# Patient Record
Sex: Female | Born: 1945 | Race: White | Hispanic: No | Marital: Married | Smoking: Never smoker
Health system: Southern US, Community
[De-identification: ages and names within clinical notes are randomized; demographics above are authoritative.]

## PROBLEM LIST (undated history)

## (undated) DIAGNOSIS — E785 Hyperlipidemia, unspecified: Secondary | ICD-10-CM

## (undated) HISTORY — DX: Hyperlipidemia, unspecified: E78.5

---

## 1983-07-25 HISTORY — PX: THYROIDECTOMY: SHX17

## 2001-01-29 ENCOUNTER — Other Ambulatory Visit: Admission: RE | Admit: 2001-01-29 | Discharge: 2001-01-29 | Payer: Self-pay | Admitting: Family Medicine

## 2004-05-05 ENCOUNTER — Other Ambulatory Visit: Admission: RE | Admit: 2004-05-05 | Discharge: 2004-05-05 | Payer: Self-pay | Admitting: Family Medicine

## 2005-05-05 ENCOUNTER — Encounter (INDEPENDENT_AMBULATORY_CARE_PROVIDER_SITE_OTHER): Payer: Self-pay | Admitting: Specialist

## 2005-05-05 ENCOUNTER — Observation Stay (HOSPITAL_COMMUNITY): Admission: RE | Admit: 2005-05-05 | Discharge: 2005-05-06 | Payer: Self-pay | Admitting: Obstetrics and Gynecology

## 2007-02-21 ENCOUNTER — Other Ambulatory Visit: Admission: RE | Admit: 2007-02-21 | Discharge: 2007-02-21 | Payer: Self-pay | Admitting: Family Medicine

## 2009-10-01 ENCOUNTER — Encounter (INDEPENDENT_AMBULATORY_CARE_PROVIDER_SITE_OTHER): Payer: Self-pay | Admitting: *Deleted

## 2010-09-20 ENCOUNTER — Other Ambulatory Visit: Payer: Self-pay | Admitting: Family Medicine

## 2010-09-20 DIAGNOSIS — Z1231 Encounter for screening mammogram for malignant neoplasm of breast: Secondary | ICD-10-CM

## 2010-09-27 ENCOUNTER — Ambulatory Visit
Admission: RE | Admit: 2010-09-27 | Discharge: 2010-09-27 | Disposition: A | Discharge status: Home / Self Care | Payer: BC Managed Care – PPO | Source: Ambulatory Visit | Attending: Family Medicine | Admitting: Family Medicine

## 2010-09-27 DIAGNOSIS — Z1231 Encounter for screening mammogram for malignant neoplasm of breast: Secondary | ICD-10-CM

## 2010-09-28 ENCOUNTER — Ambulatory Visit: Payer: Self-pay

## 2010-12-09 NOTE — Discharge Summary (Signed)
Samantha Graham, Samantha Graham           ACCOUNT NO.:  [id number]   MEDICAL RECORD NO.:  [id number]          PATIENT TYPE:  OBV   LOCATION:  9302                          FACILITY:  [facility]   PHYSICIAN:  Duke Salvia. Marcelle Overlie, M.D.DATE OF BIRTH:  1945-10-02   DATE OF ADMISSION:  05/05/2005  DATE OF DISCHARGE:  05/06/2005                                 DISCHARGE SUMMARY   DISCHARGE DIAGNOSES:  1.  Pelvic pain, dyspareunia.  2.  Laparoscopically assisted vaginal hysterectomy, bilateral salpingo-      oophorectomy this admission.   For summary of history and physical exam, please see admission H&P for  details. A 65 year old has had a prior myomectomy presents with increased  pelvic pressure, pain and dyspareunia for LAVH/BSO.   HOSPITAL COURSE:  On October 13 under general anesthesia, the patient  underwent LAVH/BSO, dense adhesions from the prior myomectomy were noted  along the anterior abdominal wall which were lysed. LAVH/BSO were carried  out. On the first postoperative day, she was doing well, afebrile. Abdominal  exam was unremarkable, hemoglobin 11.6, and she was ready for discharge.   LABORATORY DATA:  Preoperative hemoglobin 13.9. UA was negative.   DISPOSITION:  The patient was discharged on Tylox p.r.n. pain and will  return to the office in one week. Advised her to report any incisional  redness or drainage, increased pain or bleeding or fever over 101. She was  given specific instructions regarding diet and exercise.   CONDITION:  Good.   ACTIVITY:  Gradually increase.      Richard M. Marcelle Overlie, M.D.  Electronically Signed     RMH/MEDQ  D:  05/06/2005  T:  05/06/2005  Job:  [id number]

## 2010-12-09 NOTE — Op Note (Signed)
NAMEANYE, BROSE           ACCOUNT NO.:  [id number]   MEDICAL RECORD NO.:  [id number]          PATIENT TYPE:  AMB   LOCATION:  SDC                           [facility]ILITY:  [facility]   PHYSICIAN:  Duke Salvia. Marcelle Overlie, M.D.DATE OF BIRTH:  [birth year]   DATE OF PROCEDURE:  05/05/2005  DATE OF DISCHARGE:                                 OPERATIVE REPORT   PREOPERATIVE DIAGNOSIS:  Pelvic pain, dyspareunia.   POSTOPERATIVE DIAGNOSIS:  Pelvic adhesions from her prior myomectomy.   PROCEDURE:  Diagnostic laparoscopy, lysis of adhesions, LAVH, BSO.   SURGEON:  Duke Salvia. Marcelle Overlie, M.D.   ASSISTANT:  Zelphia Cairo, M.D.   ANESTHESIA:  General endotracheal.   COMPLICATIONS:  None.   ESTIMATED BLOOD LOSS:  100.   SPECIMENS REMOVED:  Uterus, bilateral tubes and ovaries.   DESCRIPTION OF PROCEDURE:  The patient was taken to the operating room and  after adequate level of general endotracheal anesthesia was obtained with  the patient's legs in stirrups. The abdomen and perineum and vagina prepped  and draped in usual manner for laparoscopy. Hulka tenaculum position after  Foley catheter used to drain the bladder. The subumbilical area was  infiltrated with half percent Marcaine plain. A small incision was made and  the Veress needle was introduced without difficulty. Its intra-abdominal  position was verified by pressure and water testing. After 2.5 liter  pneumoperitoneum was created, laparoscopic trocar and sleeve were then  introduced without difficulty. Three fingerbreadths above the symphysis in  midline 5 mm trocar was inserted and blunt probe was positioned. The patient  was in Trendelenburg. Pelvic findings as follows.   There was some omental adhesions into the lower anterior abdominal wall  which were coagulated and cut in an avascular plane. Once this was freed up,  the uterus itself and bilateral adnexa were unremarkable except for dense  band of scar tissue from the anterior  uterus just below the fundus to the  lower anterior abdominal wall. This was coagulated and cut in an avascular  plane untethering the uterus. With further manipulation, the band scar  tissue was dissected and released. Grasping forceps then used to grasp the  right tube and ovary which was placed on traction toward the midline. The  course of the right ureter was well below, the gyrus PK instrument was then  used to coagulate and cut the right IP ligament close to the ovary and down  through the broad ligament and including the round ligament which was  divided with excellent hemostasis. The exact same repeated on the opposite  side, again after carefully identifying the course of the ureter well below.  Once this was completed, the vaginal portion of procedure was started.   The patient's legs were extended, weighted speculum was positioned. Cervix  grasped with tenaculum and the cervical vaginal mucosa was incised.  Posterior culdotomy performed without difficulty. The handheld gyrus PK was  then used to coagulate and cut the uterosacral ligament on either side. The  bladder was then advanced superiorly. The peritoneum was identified and  entered sharply without complication and the retractor was used to gently  elevate the bladder out of the field. Staying close to the uterus then, the  cardinal ligament in uterine vascular pedicles were coagulated and cut with  the gyrus PK instrument. The fundus of the uterus then delivered  posteriorly. The remaining pedicle was then clamped with curved Heaney clamp  and the specimen excised. 0 Vicryl ties used on those last remaining two  pedicles. The cuff was then closed from 3 o'clock to 9 o'clock with a 2-0  Vicryl locked suture. The McCall's culdoplasty suture was placed from left  uterosacral ligament picking up posterior peritoneum across to the right  uterosacral ligament which was tied down. Prior to closure sponge, needle,  and instrument  counts were reported as correct x2. The vaginal cuff was  closed from right-to-left with interrupted 2-0 Monocryl sutures with  excellent hemostasis. Clear urine noted at the end of the case and attention  directed to the abdomen where the abdomen was reinsufflated. The Nezhat was  used to irrigate the pelvis and the gas pressure was reduced revealing  excellent hemostasis. Instruments were removed, gas allowed to escape.  Defects were closed with 4-0 Dexon subcuticular sutures and Dermabond. She  tolerated this well and went to recovery room in good condition.      Richard M. Marcelle Overlie, M.D.  Electronically Signed     RMH/MEDQ  D:  05/05/2005  T:  05/05/2005  Job:  [id number]

## 2010-12-09 NOTE — H&P (Signed)
NAMESHANDY, Samantha Graham           ACCOUNT NO.:  [id number]   MEDICAL RECORD NO.:  [id number]          PATIENT TYPE:  AMB   LOCATION:  SDC                           [facility]   PHYSICIAN:  Duke Salvia. Marcelle Graham, M.D.DATE OF BIRTH:  [birth year]   DATE OF ADMISSION:  05/05/2005  DATE OF DISCHARGE:                                HISTORY & PHYSICAL   CHIEF COMPLAINT:  Deep pain with intercourse, pelvic pain.   HISTORY OF PRESENT ILLNESS:  A 65 year old, G2, P2, post menopausal patient  who has not been no HRT.  Her medical doctor had sent her to Dr. Bjorn Graham  recently thinking she may have had a cystocele.  She was referred here after  a fairly negative evaluation with the urologist, not complaining of any SUI.  Her history is significant for the fact that she had a prior myomectomy in  1996 through a Pfannenstiel incision.  A tubal was done at the same time and  tubes and ovaries were normal then.  Steadily over the last year or so, she  has had increased deep pain with intercourse and also a throbbing deep  pelvic pain when she is on her feet a lot.  She has not had any post  menopausal bleeding.  Examination revealed a uterus that was upper limits of  normal size and mobile.   She presents now for LAVH, BSO, understands that there may be a chance she  would require TAH BSO significant adhesions were noted from her prior  Pfannenstiel incision.  Other details related to the surgery regarding risks  of bleeding, infection, adjacent organ injury, with a possible need for open  or additional surgery, along with her expected recovery time all reviewed.  Pre-op exam revealed reasonably good support of her anterior and posterior  vaginal walls.   PAST MEDICAL HISTORY:   ALLERGIES:  None.   OPERATIONS:  Myomectomy.   CURRENT MEDICATIONS:  Synthroid.   OBSTETRICAL HISTORY:  Two vaginal deliveries at term.   FAMILY HISTORY:  Significant for a mother with hypertension, otherwise  unremarkable.   PHYSICAL EXAMINATION:  VITAL SIGNS:  Temp 98.2, blood pressure 90/62.  HEENT:  Unremarkable.  NECK:  Supple without masses.  LUNGS:  Clear.  CARDIOVASCULAR:  Regular rate and rhythm without murmurs, rubs, gallops.  BREASTS:  Without masses.  ABDOMEN:  Soft, flat, nontender.  PELVIC:  Normal external genitalia.  Vagina and cervix clear.  Uterus upper  limits of normal size, mobile.  Adnexa ne.  Anterior and posterior support  was good.  A mild degree of uterine prolapse was noted.  EXTREMITIES:  Unremarkable.  NEUROLOGIC:  Unremarkable.   IMPRESSION:  1.  Deep dyspareunia.  2.  Pelvic pain.  3.  Mild uterine prolapse.  4.  History of prior myomectomy in 1996.   PLAN:  LAVH-BSO, possible TAH-BSO.  Procedure and risks reviewed as above.      Samantha Graham, M.D.  Electronically Signed     RMH/MEDQ  D:  05/03/2005  T:  05/03/2005  Job:  [id number]

## 2011-01-16 NOTE — Letter (Signed)
Summary: Referral - not able to see patient  [facility]  [address] [place], [place] [address]   Phone: [phone]  Fax: [phone]    October 01, 2009        Dr. Artis Flock [address] [place], [place]  [address]              Re:   Samantha Graham DOB:  [birth year] MRN:   [id number]    Dear Dr. Artis Flock:  Thank you for your kind referral of the above patient.  We have attempted to schedule the recommended procedure, colonoscopy, but have not been able to schedule because:  _x_ The patient was not available by phone and/or has not returned our calls.  ___ The patient declined to schedule the procedure at this time.  We appreciate the referral and hope that we will have the opportunity to treat this patient in the future.    Sincerely,    [facility] [phone]

## 2011-04-13 ENCOUNTER — Ambulatory Visit: Payer: PRIVATE HEALTH INSURANCE | Attending: Orthopedic Surgery | Admitting: Physical Therapy

## 2011-04-13 DIAGNOSIS — M25519 Pain in unspecified shoulder: Secondary | ICD-10-CM | POA: Insufficient documentation

## 2011-04-13 DIAGNOSIS — IMO0001 Reserved for inherently not codable concepts without codable children: Secondary | ICD-10-CM | POA: Insufficient documentation

## 2011-04-13 DIAGNOSIS — M25619 Stiffness of unspecified shoulder, not elsewhere classified: Secondary | ICD-10-CM | POA: Insufficient documentation

## 2011-04-13 DIAGNOSIS — M6281 Muscle weakness (generalized): Secondary | ICD-10-CM | POA: Insufficient documentation

## 2011-04-17 ENCOUNTER — Ambulatory Visit: Payer: PRIVATE HEALTH INSURANCE | Admitting: Physical Therapy

## 2011-04-20 ENCOUNTER — Ambulatory Visit: Payer: PRIVATE HEALTH INSURANCE | Admitting: Physical Therapy

## 2011-04-24 ENCOUNTER — Ambulatory Visit: Payer: PRIVATE HEALTH INSURANCE | Attending: Orthopedic Surgery | Admitting: Physical Therapy

## 2011-04-24 DIAGNOSIS — M25619 Stiffness of unspecified shoulder, not elsewhere classified: Secondary | ICD-10-CM | POA: Insufficient documentation

## 2011-04-24 DIAGNOSIS — IMO0001 Reserved for inherently not codable concepts without codable children: Secondary | ICD-10-CM | POA: Insufficient documentation

## 2011-04-24 DIAGNOSIS — M6281 Muscle weakness (generalized): Secondary | ICD-10-CM | POA: Insufficient documentation

## 2011-04-24 DIAGNOSIS — M25519 Pain in unspecified shoulder: Secondary | ICD-10-CM | POA: Insufficient documentation

## 2011-04-27 ENCOUNTER — Ambulatory Visit: Payer: PRIVATE HEALTH INSURANCE | Admitting: Physical Therapy

## 2011-05-04 ENCOUNTER — Ambulatory Visit: Payer: PRIVATE HEALTH INSURANCE | Admitting: Physical Therapy

## 2012-04-04 ENCOUNTER — Ambulatory Visit: Payer: PRIVATE HEALTH INSURANCE | Attending: Orthopedic Surgery | Admitting: Physical Therapy

## 2012-04-04 DIAGNOSIS — M25669 Stiffness of unspecified knee, not elsewhere classified: Secondary | ICD-10-CM | POA: Insufficient documentation

## 2012-04-04 DIAGNOSIS — IMO0001 Reserved for inherently not codable concepts without codable children: Secondary | ICD-10-CM | POA: Insufficient documentation

## 2012-04-04 DIAGNOSIS — R5381 Other malaise: Secondary | ICD-10-CM | POA: Insufficient documentation

## 2012-04-04 DIAGNOSIS — M25569 Pain in unspecified knee: Secondary | ICD-10-CM | POA: Insufficient documentation

## 2012-04-11 ENCOUNTER — Ambulatory Visit: Payer: PRIVATE HEALTH INSURANCE | Admitting: Physical Therapy

## 2012-04-18 ENCOUNTER — Ambulatory Visit: Payer: PRIVATE HEALTH INSURANCE | Admitting: Physical Therapy

## 2012-04-25 ENCOUNTER — Ambulatory Visit: Payer: PRIVATE HEALTH INSURANCE | Attending: Orthopedic Surgery | Admitting: Physical Therapy

## 2012-04-25 DIAGNOSIS — M25669 Stiffness of unspecified knee, not elsewhere classified: Secondary | ICD-10-CM | POA: Insufficient documentation

## 2012-04-25 DIAGNOSIS — IMO0001 Reserved for inherently not codable concepts without codable children: Secondary | ICD-10-CM | POA: Insufficient documentation

## 2012-04-25 DIAGNOSIS — R5381 Other malaise: Secondary | ICD-10-CM | POA: Insufficient documentation

## 2012-04-25 DIAGNOSIS — M25569 Pain in unspecified knee: Secondary | ICD-10-CM | POA: Insufficient documentation

## 2012-05-02 ENCOUNTER — Ambulatory Visit: Payer: PRIVATE HEALTH INSURANCE | Admitting: Physical Therapy

## 2012-05-09 ENCOUNTER — Ambulatory Visit: Payer: PRIVATE HEALTH INSURANCE | Admitting: Physical Therapy

## 2013-02-07 ENCOUNTER — Other Ambulatory Visit: Payer: Self-pay | Admitting: Family Medicine

## 2013-02-07 DIAGNOSIS — Z1231 Encounter for screening mammogram for malignant neoplasm of breast: Secondary | ICD-10-CM

## 2013-02-25 ENCOUNTER — Ambulatory Visit: Payer: PRIVATE HEALTH INSURANCE

## 2013-03-11 ENCOUNTER — Ambulatory Visit (INDEPENDENT_AMBULATORY_CARE_PROVIDER_SITE_OTHER): Payer: PRIVATE HEALTH INSURANCE

## 2013-03-11 DIAGNOSIS — Z1231 Encounter for screening mammogram for malignant neoplasm of breast: Secondary | ICD-10-CM

## 2013-07-03 ENCOUNTER — Other Ambulatory Visit (INDEPENDENT_AMBULATORY_CARE_PROVIDER_SITE_OTHER): Payer: PRIVATE HEALTH INSURANCE

## 2013-07-03 ENCOUNTER — Other Ambulatory Visit: Payer: PRIVATE HEALTH INSURANCE

## 2013-07-03 ENCOUNTER — Other Ambulatory Visit: Payer: Self-pay | Admitting: *Deleted

## 2013-07-03 ENCOUNTER — Encounter: Payer: Self-pay | Admitting: Endocrinology

## 2013-07-03 ENCOUNTER — Ambulatory Visit (INDEPENDENT_AMBULATORY_CARE_PROVIDER_SITE_OTHER): Payer: PRIVATE HEALTH INSURANCE | Admitting: Endocrinology

## 2013-07-03 VITALS — BP 112/58 | HR 57 | Temp 97.6°F | Resp 10 | Ht 63.0 in | Wt 113.0 lb

## 2013-07-03 DIAGNOSIS — E559 Vitamin D deficiency, unspecified: Secondary | ICD-10-CM

## 2013-07-03 DIAGNOSIS — E039 Hypothyroidism, unspecified: Secondary | ICD-10-CM | POA: Insufficient documentation

## 2013-07-03 LAB — TSH: TSH: 0.32 u[IU]/mL — ABNORMAL LOW (ref 0.35–5.50)

## 2013-07-03 LAB — T4, FREE: Free T4: 1.1 ng/dL (ref 0.60–1.60)

## 2013-07-03 NOTE — Patient Instructions (Signed)
Stay on Vitamin D

## 2013-07-03 NOTE — Progress Notes (Signed)
Patient ID: Samantha Graham, female   DOB: 11-22-1945, 67 y.o.   MRN: [id number]  Reason for Appointment:  Hypothyroidism, followup visit   History of Present Illness:   The hypothyroidism was first diagnosed in 1985  She has history of hyperthyroidism and thyroid nodule treated with radioactive iodine and thyroidectomy  The patient has been treated with brand name Synthroid. For several years has been taking 88 mcg The last visit was in 11/13   Patient has no complaints of unusual fatigue, cold sensitivity, dry skin, unusual weight gain or hair loss.            The patient is taking the thyroid supplement very regularly in the morning before breakfast. Not taking any calcium or iron supplements with the thyroid supplement.    On the last visit the dose was not changed  Appointment on 07/03/2013  Component Date Value Range Status  . TSH 07/03/2013 0.32* 0.35 - 5.50 uIU/mL Final  . Free T4 07/03/2013 1.10  0.60 - 1.60 ng/dL Final      Medication List       This list is accurate as of: 07/03/13  7:57 PM.  Always use your most recent med list.               SYNTHROID 88 MCG tablet  Generic drug:  levothyroxine        Allergies: No Known Allergies  Past Medical History  Diagnosis Date  . Hyperlipidemia     Past Surgical History  Procedure Laterality Date  . Thyroidectomy  1985    Family History  Problem Relation Age of Onset  . Thyroid disease Mother   . Cancer Father   . Diabetes Cousin     Social History:  reports that she has never smoked. She has never used smokeless tobacco. Her alcohol and drug histories are not on file.  REVIEW Of SYSTEMS:  Insomnia still present, getting up at 4 AM. She gets tired because of this at times She is trying to exercise which helps along with taking herbal supplements  Vitamin D supplements: 5000 daily for 1 year, previously had a low level, detailed records not available   Examination:   BP 112/58  Pulse 57   Temp(Src) 97.6 F (36.4 C)  Resp 10  Ht 5\' 3"  (1.6 m)  Wt 113 lb (51.256 kg)  BMI 20.02 kg/m2  SpO2 97%  GENERAL APPEARANCE: Alert, no puffiness of the face or eyes  NECK: Thyroid is not palpable           NEUROLOGIC EXAM:  biceps reflexes show normal relaxation Skin: Not unusual dry    Assessment   Hypothyroidism, long-standing and for surgical with stable levels in the past Clinically she appears euthyroid and is not having any new symptoms  Vitamin D deficiency: She has been taking her 5000 units daily supplement regularly   Treatment:   If the TSH is normal will same dosage before breakfast daily. Avoid taking any calcium or iron supplements with the thyroid supplement.  To check vitamin D level also     Samantha Graham 07/03/2013, 7:57 PM

## 2013-07-08 ENCOUNTER — Other Ambulatory Visit: Payer: Self-pay | Admitting: *Deleted

## 2013-07-08 ENCOUNTER — Other Ambulatory Visit: Payer: PRIVATE HEALTH INSURANCE

## 2013-07-08 ENCOUNTER — Ambulatory Visit: Payer: PRIVATE HEALTH INSURANCE | Admitting: Endocrinology

## 2013-07-08 MED ORDER — LEVOTHYROXINE SODIUM 88 MCG PO TABS: ORAL_TABLET | ORAL | Status: DC

## 2013-07-08 MED ORDER — SYNTHROID 88 MCG PO TABS: ORAL_TABLET | ORAL | Status: DC

## 2014-01-06 ENCOUNTER — Other Ambulatory Visit: Payer: PRIVATE HEALTH INSURANCE

## 2014-01-06 ENCOUNTER — Other Ambulatory Visit (INDEPENDENT_AMBULATORY_CARE_PROVIDER_SITE_OTHER): Payer: PRIVATE HEALTH INSURANCE

## 2014-01-06 DIAGNOSIS — E039 Hypothyroidism, unspecified: Secondary | ICD-10-CM

## 2014-01-06 DIAGNOSIS — E559 Vitamin D deficiency, unspecified: Secondary | ICD-10-CM

## 2014-01-06 LAB — T4, FREE: Free T4: 0.97 ng/dL (ref 0.60–1.60)

## 2014-01-06 LAB — TSH: TSH: 0.12 u[IU]/mL — ABNORMAL LOW (ref 0.35–4.50)

## 2014-01-07 LAB — VITAMIN D 25 HYDROXY (VIT D DEFICIENCY, FRACTURES): Vit D, 25-Hydroxy: 48 ng/mL (ref 30–89)

## 2014-01-14 ENCOUNTER — Other Ambulatory Visit: Payer: Self-pay | Admitting: *Deleted

## 2014-01-14 ENCOUNTER — Telehealth: Payer: Self-pay | Admitting: Endocrinology

## 2014-01-14 MED ORDER — SYNTHROID 75 MCG PO TABS: 75.0000 ug | ORAL_TABLET | Freq: Every day | ORAL | Status: DC

## 2014-01-14 NOTE — Telephone Encounter (Signed)
Are we going to change her thyroid med now and are we going to check the vitamin d level please call pt back with answer if med is chagned please call in a 90 day supply call into [facility]

## 2014-01-14 NOTE — Telephone Encounter (Signed)
Noted, patient is aware, rx sent, appointment made

## 2014-01-14 NOTE — Telephone Encounter (Signed)
She needs 30 tablets of Synthroid brand name 75 mcg with only one refill. Need to check her again in 6 weeks and see her for office visit before giving her 90 day supply. Vitamin D level was normal

## 2014-01-14 NOTE — Telephone Encounter (Signed)
Please see below and advise.

## 2014-02-05 ENCOUNTER — Ambulatory Visit (INDEPENDENT_AMBULATORY_CARE_PROVIDER_SITE_OTHER): Payer: PRIVATE HEALTH INSURANCE | Admitting: Endocrinology

## 2014-02-05 ENCOUNTER — Encounter: Payer: Self-pay | Admitting: Endocrinology

## 2014-02-05 VITALS — BP 94/50 | HR 53 | Temp 98.2°F | Resp 14 | Ht 63.0 in | Wt 113.0 lb

## 2014-02-05 DIAGNOSIS — E890 Postprocedural hypothyroidism: Secondary | ICD-10-CM

## 2014-02-05 LAB — T4, FREE: Free T4: 1.04 ng/dL (ref 0.60–1.60)

## 2014-02-05 LAB — TSH: TSH: 0.14 u[IU]/mL — ABNORMAL LOW (ref 0.35–4.50)

## 2014-02-05 NOTE — Progress Notes (Signed)
Patient ID: Samantha Graham, female   DOB: 11-30-1945, 68 y.o.   MRN: [id number]   Reason for Appointment:  Hypothyroidism, followup visit   History of Present Illness:   The hypothyroidism was first diagnosed in 1985  She has history of hyperthyroidism and thyroid nodule treated with thyroidectomy and ? Radioactive iodine but detailed records are not available of this  The patient has been treated with brand name Synthroid. According to her records since about 2001 has been taking 88 mcg The last visit was in 12/14   Patient has no complaints of unusual fatigue, cold sensitivity, dry skin, unusual weight gain or hair loss.            The patient is taking the thyroid supplement very regularly in the morning before breakfast.  Not taking any calcium or iron supplements with the thyroid supplement.  She is asking about any interaction with green tea extract    On the last visit the dose of levothyroxine was reduced by half a tablet per week because of low normal TSH of 0.32 However without any change in her weight or other factors her TSH is lower now. She has no unusual anxiety or palpitations   Lab Results  Component Value Date   FREET4 0.97 01/06/2014   FREET4 1.10 07/03/2013   TSH 0.12* 01/06/2014   TSH 0.32* 07/03/2013     No visits with results within 1 Week(s) from this visit. Latest known visit with results is:  Appointment on 01/06/2014  Component Date Value Ref Range Status  . TSH 01/06/2014 0.12* 0.35 - 4.50 uIU/mL Final  . Free T4 01/06/2014 0.97  0.60 - 1.60 ng/dL Final  . Vit D, 25-Hydroxy 01/06/2014 48  30 - 89 ng/mL Final   Comment: This assay accurately quantifies Vitamin D, which is the sum of the                          25-Hydroxy forms of Vitamin D2 and D3.  Studies have shown that the                          optimum concentration of 25-Hydroxy Vitamin D is 30 ng/mL or higher.                           Concentrations of Vitamin D between 20 and 29 ng/mL  are considered to                          be insufficient and concentrations less than 20 ng/mL are considered                          to be deficient for Vitamin D.      Medication List       This list is accurate as of: 02/05/14  1:49 PM.  Always use your most recent med list.               SYNTHROID 75 MCG tablet  Generic drug:  levothyroxine  Take 1 tablet (75 mcg total) by mouth daily before breakfast.        Allergies: No Known Allergies  Past Medical History  Diagnosis Date  . Hyperlipidemia     Past Surgical History  Procedure Laterality Date  . Thyroidectomy  1985  Family History  Problem Relation Age of Onset  . Thyroid disease Mother   . Cancer Father   . Diabetes Cousin     Social History:  reports that she has never smoked. She has never used smokeless tobacco. Her alcohol and drug histories are not on file.  REVIEW Of SYSTEMS:  Insomnia still present, getting up at 4 AM. She gets tired because of this at times She is trying to exercise which helps along with taking herbal supplements  Vitamin D supplements: 5000 daily for 1 year, previously had a low level, detailed records not available   Examination:   BP 94/50  Pulse 53  Temp(Src) 98.2 F (36.8 C)  Resp 14  Ht 5\' 3"  (1.6 m)  Wt 113 lb (51.256 kg)  BMI 20.02 kg/m2  SpO2 97%  GENERAL APPEARANCE: Alert, no puffiness of the face or eyes  NECK: Thyroid is not palpable           NEUROLOGIC EXAM:  biceps reflexes show normal relaxation Skin: No skin changes or peripheral edema    Assessment   Hypothyroidism, long-standing, postsurgical with stable levels in the past Clinically she appears euthyroid and is not having any new symptoms However her TSH is unusually low with her usual regimen She has recently been changed to 75 mcg daily  Vitamin D deficiency: She has been taking her 2000 units daily supplement regularly and her level is therapeutic   Treatment:   Will check TSH  again today to see if it is close to target     [facility] 02/05/2014, 1:49 PM   Addendum: TSH still about the same and she will need to switch to 62.5 mcg daily and followup in 2 months

## 2014-02-06 ENCOUNTER — Other Ambulatory Visit: Payer: Self-pay | Admitting: *Deleted

## 2014-02-06 MED ORDER — SYNTHROID 125 MCG PO TABS: ORAL_TABLET | ORAL | Status: DC

## 2014-02-06 NOTE — Progress Notes (Signed)
Quick Note:  Please let patient know that the thyroid level is still relatively high, need to change her dose to 125 mcg, half tablet daily. Needs to have labs and followup in 2 months ______

## 2014-04-02 ENCOUNTER — Telehealth: Payer: Self-pay | Admitting: Endocrinology

## 2014-04-02 NOTE — Telephone Encounter (Signed)
Please see below and advise.

## 2014-04-02 NOTE — Telephone Encounter (Signed)
Patient would like to consult this with Dr. Dwyane Dee and his nurse   .125 mcg half tab daily  Premarin cause thyroid levels to be off Patient is extremely tired brain fog  She feels her dosage is not correct   [phone]  Thank you

## 2014-04-02 NOTE — Telephone Encounter (Signed)
Needs to be seen with labs.

## 2014-04-03 NOTE — Telephone Encounter (Signed)
Left message with husband for her to call back and schedule appointment

## 2014-04-22 ENCOUNTER — Other Ambulatory Visit: Payer: Self-pay | Admitting: Endocrinology

## 2014-04-23 ENCOUNTER — Other Ambulatory Visit: Payer: PRIVATE HEALTH INSURANCE

## 2014-04-23 LAB — T3, FREE: T3, Free: 2.1 pg/mL — ABNORMAL LOW (ref 2.3–4.2)

## 2014-04-23 LAB — TSH: TSH: 0.916 u[IU]/mL (ref 0.350–4.500)

## 2014-04-23 LAB — T4, FREE: Free T4: 1.26 ng/dL (ref 0.80–1.80)

## 2014-04-30 ENCOUNTER — Encounter: Payer: Self-pay | Admitting: Endocrinology

## 2014-04-30 ENCOUNTER — Ambulatory Visit (INDEPENDENT_AMBULATORY_CARE_PROVIDER_SITE_OTHER): Payer: PRIVATE HEALTH INSURANCE | Admitting: Endocrinology

## 2014-04-30 VITALS — BP 104/64 | HR 61 | Temp 98.0°F | Resp 14 | Ht 63.0 in | Wt 113.8 lb

## 2014-04-30 DIAGNOSIS — E890 Postprocedural hypothyroidism: Secondary | ICD-10-CM

## 2014-04-30 DIAGNOSIS — R5383 Other fatigue: Secondary | ICD-10-CM

## 2014-04-30 MED ORDER — THYROID 90 MG PO TABS: ORAL_TABLET | ORAL | Status: DC

## 2014-04-30 NOTE — Progress Notes (Signed)
Patient ID: Samantha Graham, female   DOB: 06/26/1946, 68 y.o.   MRN: [id number]   Reason for Appointment:  Hypothyroidism, followup visit   History of Present Illness:   The hypothyroidism was first diagnosed in 1985  She has history of hyperthyroidism and thyroid nodule treated with thyroidectomy and ? Radioactive iodine but detailed records are not available of this  The patient has been treated with brand name Synthroid.  According to her records since about 2001 had been taking 88 mcg  Since about 12/14 her TSH has been relatively low and her dose has been reduced gradually However despite taking lower doses her TSH was still low in 7/15 and her dose was reduced further to 62.5 mcg   She now complains that she is having extreme fatigue, brain fog and she is convinced that she will do better with a higher dose She started having these symptoms about 2 weeks after that dosage reduction This is despite her TSH now being quite normal at 0.92 and normal free T4 She continues to take brand name Synthroid The patient is taking the thyroid supplement very regularly in the morning before breakfast.  Not taking any calcium or iron supplements with the thyroid supplement.   Also she was reading that Premarin affects her thyroid and she stopped taking this about a month ago but was already feeling tired before this She continues to take some herbal supplements but these do not have obvious any interactive substances    Lab Results  Component Value Date   FREET4 1.26 04/22/2014   FREET4 1.04 02/05/2014   FREET4 0.97 01/06/2014   TSH 0.916 04/22/2014   TSH 0.14* 02/05/2014   TSH 0.12* 01/06/2014     No visits with results within 1 Week(s) from this visit. Latest known visit with results is:  Orders Only on 04/22/2014  Component Date Value Ref Range Status  . TSH 04/22/2014 0.916  0.350 - 4.500 uIU/mL Final  . Free T4 04/22/2014 1.26  0.80 - 1.80 ng/dL Final  . T3, Free 04/22/2014 2.1*  2.3 - 4.2 pg/mL Final      Medication List       This list is accurate as of: 04/30/14  3:46 PM.  Always use your most recent med list.               SYNTHROID 125 MCG tablet  Generic drug:  levothyroxine  Take 1/2 tablet daily        Allergies: No Known Allergies  Past Medical History  Diagnosis Date  . Hyperlipidemia     Past Surgical History  Procedure Laterality Date  . Thyroidectomy  1985    Family History  Problem Relation Age of Onset  . Thyroid disease Mother   . Cancer Father   . Diabetes Cousin     Social History:  reports that she has never smoked. She has never used smokeless tobacco. Her alcohol and drug histories are not on file.  REVIEW Of SYSTEMS:  Stopped Premarin 1 month ago, was taking low dose for vaginal dryness  Insomnia still present, worse now but has not discussed with her PCP She is trying to exercise aerobically which helps along with taking herbal supplements although she thinks she gets tired when she does this  Vitamin D supplements: Taking 5000 units daily for deficiency; her last level was normal  She was told by her gynecologist that she had osteoporosis but no bone density records are available. She is concerned about  lack of efficacy of Fosamax with her mother   Examination:   BP 104/64  Pulse 61  Temp(Src) 98 F (36.7 C)  Resp 14  Ht 5\' 3"  (1.6 m)  Wt 113 lb 12.8 oz (51.619 kg)  BMI 20.16 kg/m2  SpO2 98%  GENERAL APPEARANCE: Looks well no puffiness of the face or eyes Skin: No skin changes or peripheral edema    Assessment   Hypothyroidism, long-standing, postsurgical with stable levels in the past However she appears to be requiring lower doses in the last year with her TSH being persistently low except on her current regimen of 62.5 mcg Despite normal thyroid levels and taking brand name Synthroid she is complaining of extreme fatigue and difficulty concentrating She also has significant problems with  insomnia Clinically she appears euthyroid    Treatment:   Will give her trial of Armour Thyroid 45 mg which is about equivalent to her current Synthroid dose explained to her that this may help her symptomatically since it has a day 3 component Will check TSH again in 6 weeks  Advised her to establish with PCP especially for management of her chronic insomnia   Elorah Dewing 04/30/2014, 3:46 PM   Addendum: TSH still about the same and she will need to switch to 62.5 mcg daily and followup in 2 months

## 2014-05-08 ENCOUNTER — Telehealth: Payer: Self-pay | Admitting: Endocrinology

## 2014-05-08 NOTE — Telephone Encounter (Signed)
Unable to reach patient at home regarding bone density result. T score -3.0 Please let her know that she has significant osteoporosis, would recommend intravenous Reclast once a year, can send her information is needed

## 2014-05-12 ENCOUNTER — Other Ambulatory Visit: Payer: Self-pay | Admitting: *Deleted

## 2014-05-12 DIAGNOSIS — E039 Hypothyroidism, unspecified: Secondary | ICD-10-CM

## 2014-05-12 NOTE — Telephone Encounter (Signed)
Patient is aware, she wants to wait until her next appointment 11/19 to have her labs done and discuss the relcast infusion.

## 2014-06-11 ENCOUNTER — Other Ambulatory Visit: Payer: PRIVATE HEALTH INSURANCE

## 2014-06-16 ENCOUNTER — Ambulatory Visit: Payer: PRIVATE HEALTH INSURANCE | Admitting: Endocrinology

## 2014-06-16 ENCOUNTER — Other Ambulatory Visit (INDEPENDENT_AMBULATORY_CARE_PROVIDER_SITE_OTHER): Payer: PRIVATE HEALTH INSURANCE

## 2014-06-16 DIAGNOSIS — E039 Hypothyroidism, unspecified: Secondary | ICD-10-CM

## 2014-06-16 LAB — TSH: TSH: 0.6 u[IU]/mL (ref 0.35–4.50)

## 2014-06-16 LAB — COMPREHENSIVE METABOLIC PANEL
ALT: 16 U/L (ref 0–35)
AST: 24 U/L (ref 0–37)
Albumin: 4.2 g/dL (ref 3.5–5.2)
Alkaline Phosphatase: 49 U/L (ref 39–117)
BUN: 23 mg/dL (ref 6–23)
CO2: 26 mEq/L (ref 19–32)
Calcium: 9.3 mg/dL (ref 8.4–10.5)
Chloride: 103 mEq/L (ref 96–112)
Creatinine, Ser: 0.9 mg/dL (ref 0.4–1.2)
GFR: 66.13 mL/min (ref >60.00)
Glucose, Bld: 74 mg/dL (ref 70–99)
Potassium: 3.8 mEq/L (ref 3.5–5.1)
Sodium: 138 mEq/L (ref 135–145)
Total Bilirubin: 0.5 mg/dL (ref 0.2–1.2)
Total Protein: 6.9 g/dL (ref 6.0–8.3)

## 2014-06-16 LAB — T4, FREE: Free T4: 0.65 ng/dL (ref 0.60–1.60)

## 2014-06-23 ENCOUNTER — Other Ambulatory Visit: Payer: Self-pay | Admitting: Endocrinology

## 2014-06-25 ENCOUNTER — Other Ambulatory Visit: Payer: PRIVATE HEALTH INSURANCE

## 2014-06-25 ENCOUNTER — Other Ambulatory Visit: Payer: Self-pay | Admitting: Endocrinology

## 2014-06-30 ENCOUNTER — Ambulatory Visit (INDEPENDENT_AMBULATORY_CARE_PROVIDER_SITE_OTHER): Payer: PRIVATE HEALTH INSURANCE | Admitting: Endocrinology

## 2014-06-30 ENCOUNTER — Encounter: Payer: Self-pay | Admitting: Endocrinology

## 2014-06-30 VITALS — BP 102/62 | HR 60 | Temp 97.8°F | Resp 14 | Ht 63.0 in | Wt 114.6 lb

## 2014-06-30 DIAGNOSIS — E890 Postprocedural hypothyroidism: Secondary | ICD-10-CM

## 2014-06-30 DIAGNOSIS — Z23 Encounter for immunization: Secondary | ICD-10-CM

## 2014-06-30 DIAGNOSIS — M810 Age-related osteoporosis without current pathological fracture: Secondary | ICD-10-CM

## 2014-06-30 NOTE — Progress Notes (Signed)
Patient ID: Samantha Graham, female   DOB: Jun 03, 1946, 68 y.o.   MRN: [id number]   Reason for Appointment:  Hypothyroidism, followup visit   History of Present Illness:   The hypothyroidism was first diagnosed in 1985  She has history of hyperthyroidism and thyroid nodule treated with thyroidectomy and ? Radioactive iodine but detailed records are not available of this According to her records since about 2001 had been taking 88 mcg brand name Synthroid  Since about 12/14 her TSH had been relatively low and her dose was reduced gradually to a final dose of 62.5 g She however was having complaints of extreme fatigue, brain fog and wanted to increase her dose to feel better The patient had been taking the thyroid supplement very regularly in the morning before breakfast.  Not taking any calcium or iron supplements with the thyroid supplement.   Because of her fatigue she was empirically started on Armour Thyroid in 10/15 with 45 g. She said about a month after starting this she has started feeling less tired and gets more energy after taking her medication She still has a little trouble concentrating which she thinks is mostly difficulty remembering names  She continues to take some herbal supplements for various reasons but these do not have obvious any interactive substances Her TSH is now back to normal and consistent   Lab Results  Component Value Date   FREET4 0.65 06/16/2014   FREET4 1.26 04/22/2014   FREET4 1.04 02/05/2014   TSH 0.60 06/16/2014   TSH 0.916 04/22/2014   TSH 0.14* 02/05/2014     No visits with results within 1 Week(s) from this visit. Latest known visit with results is:  Appointment on 06/16/2014  Component Date Value Ref Range Status  . Sodium 06/16/2014 138  135 - 145 mEq/L Final  . Potassium 06/16/2014 3.8  3.5 - 5.1 mEq/L Final  . Chloride 06/16/2014 103  96 - 112 mEq/L Final  . CO2 06/16/2014 26  19 - 32 mEq/L Final  . Glucose, Bld 06/16/2014 74   70 - 99 mg/dL Final  . BUN 06/16/2014 23  6 - 23 mg/dL Final  . Creatinine, Ser 06/16/2014 0.9  0.4 - 1.2 mg/dL Final  . Total Bilirubin 06/16/2014 0.5  0.2 - 1.2 mg/dL Final  . Alkaline Phosphatase 06/16/2014 49  39 - 117 U/L Final  . AST 06/16/2014 24  0 - 37 U/L Final  . ALT 06/16/2014 16  0 - 35 U/L Final  . Total Protein 06/16/2014 6.9  6.0 - 8.3 g/dL Final  . Albumin 06/16/2014 4.2  3.5 - 5.2 g/dL Final  . Calcium 06/16/2014 9.3  8.4 - 10.5 mg/dL Final  . GFR 06/16/2014 66.13  >60.00 mL/min Final  . TSH 06/16/2014 0.60  0.35 - 4.50 uIU/mL Final  . Free T4 06/16/2014 0.65  0.60 - 1.60 ng/dL Final      Medication List       This list is accurate as of: 06/30/14 11:01 AM.  Always use your most recent med list.               ARMOUR THYROID 90 MG tablet  Generic drug:  thyroid  TAKE 1/2 A TABLET BY MOUTH DAILY        Allergies: No Known Allergies  Past Medical History  Diagnosis Date  . Hyperlipidemia     Past Surgical History  Procedure Laterality Date  . Thyroidectomy  1985    Family History  Problem Relation  Age of Onset  . Thyroid disease Mother   . Cancer Father   . Diabetes Cousin     Social History:  reports that she has never smoked. She has never used smokeless tobacco. Her alcohol and drug histories are not on file.  REVIEW Of SYSTEMS:  Osteoporosis: On her bone density done by gynecologist her T score was -3.0 which she was not given any treatment.  She does take calcium and vitamin D She is concerned about lack of efficacy of Fosamax with her mother  Vitamin D supplements: Taking 5000 units daily for deficiency; her last level was normal  Stopped Premarin 1 month ago, was taking low dose for vaginal dryness, was afraid of affecting her thyroid levels  Insomnia still present, taking OTC supplements  She is trying to exercise aerobically which helps along with taking herbal supplements     Examination:   BP 102/62 mmHg  Ht 5\' 3"  (1.6 m)   Wt 114 lb 9.6 oz (51.982 kg)  BMI 20.31 kg/m2  GENERAL APPEARANCE: Looks well no puffiness of the face or eyes Skin: No skin changes or peripheral edema    Assessment   Hypothyroidism, long-standing, postsurgical with stable levels in the past However she appears to be requiring lower doses in the last year  Subjectively now she is feeling less tired with taking Armour Thyroid compared to 62.5 g of Synthroid Clinically she appears euthyroid  TSH is quite normal also  Osteoporosis with T score -3: She agrees to take Reclast and she will reduce information on line   Treatment:   Will continue Armour Thyroid 45 mg  Will check TSH again in 6 months  We will schedule her for Reclast   KUMAR,AJAY 06/30/2014, 11:01 AM

## 2014-07-02 ENCOUNTER — Telehealth: Payer: Self-pay | Admitting: Endocrinology

## 2014-07-02 NOTE — Telephone Encounter (Signed)
Patient is requesting the price of what the infusion would cost before she gets it. Dr. Dwyane Dee recommended the infusion instead of taking a daily pill  for osteoporosis. Patient gave insurance info: [name] [phone] PDY [id number](she doesn't know what the PDY is for). Advised patient Dr. Dwyane Dee is out today & tomorrow.

## 2014-07-03 NOTE — Telephone Encounter (Signed)
Need to find out Reclast cost from the person who deals with Prolia precertification, ask Larene Beach

## 2014-07-03 NOTE — Telephone Encounter (Signed)
There isn't an order put in for this.

## 2014-07-06 NOTE — Telephone Encounter (Signed)
Samantha Graham, please read notes below. This is concerning the Reclast infusion. Thank you for your help.

## 2014-07-06 NOTE — Telephone Encounter (Signed)
Larene Beach, please see below. Thanks

## 2014-07-15 NOTE — Telephone Encounter (Signed)
Rhonda, please read note below from [place] and advise pt. Thank you.

## 2014-07-15 NOTE — Telephone Encounter (Signed)
The preauthorization will determine the cost.  If it is done in a MD office, the price is different than in a hospital. So only they know that.  Will have to wait until the authorization is done.

## 2014-07-28 ENCOUNTER — Telehealth: Payer: Self-pay | Admitting: Endocrinology

## 2014-07-28 NOTE — Telephone Encounter (Signed)
Samantha Graham needs the code for the Reclast Infusion to see if this will be covered for the pt.  Can we send her elsewhere to get her reclast infusion   Can leave info with pt's husband

## 2014-08-22 ENCOUNTER — Other Ambulatory Visit: Payer: Self-pay | Admitting: Endocrinology

## 2014-10-22 ENCOUNTER — Other Ambulatory Visit: Payer: Self-pay | Admitting: Endocrinology

## 2014-10-22 ENCOUNTER — Telehealth: Payer: Self-pay | Admitting: Endocrinology

## 2014-10-22 NOTE — Telephone Encounter (Signed)
Pt needs armour thyroid 90 mg 30 day supply for her to pick up today

## 2014-10-22 NOTE — Telephone Encounter (Signed)
That was sent in this morning.

## 2014-12-24 ENCOUNTER — Other Ambulatory Visit (INDEPENDENT_AMBULATORY_CARE_PROVIDER_SITE_OTHER): Payer: PRIVATE HEALTH INSURANCE

## 2014-12-24 DIAGNOSIS — R5383 Other fatigue: Secondary | ICD-10-CM | POA: Diagnosis not present

## 2014-12-24 DIAGNOSIS — E890 Postprocedural hypothyroidism: Secondary | ICD-10-CM | POA: Diagnosis not present

## 2014-12-24 LAB — BASIC METABOLIC PANEL
BUN: 28 mg/dL — ABNORMAL HIGH (ref 6–23)
CO2: 28 mEq/L (ref 19–32)
Calcium: 9.3 mg/dL (ref 8.4–10.5)
Chloride: 102 mEq/L (ref 96–112)
Creatinine, Ser: 1.05 mg/dL (ref 0.40–1.20)
GFR: 55.27 mL/min — ABNORMAL LOW (ref >60.00)
Glucose, Bld: 96 mg/dL (ref 70–99)
Potassium: 3.9 mEq/L (ref 3.5–5.1)
Sodium: 136 mEq/L (ref 135–145)

## 2014-12-24 LAB — TSH: TSH: 0.34 u[IU]/mL — ABNORMAL LOW (ref 0.35–4.50)

## 2014-12-29 ENCOUNTER — Ambulatory Visit: Payer: PRIVATE HEALTH INSURANCE | Admitting: Endocrinology

## 2015-01-14 ENCOUNTER — Ambulatory Visit (INDEPENDENT_AMBULATORY_CARE_PROVIDER_SITE_OTHER): Payer: PRIVATE HEALTH INSURANCE | Admitting: Endocrinology

## 2015-01-14 ENCOUNTER — Encounter: Payer: Self-pay | Admitting: Endocrinology

## 2015-01-14 VITALS — BP 92/60 | HR 75 | Temp 97.7°F | Resp 14 | Ht 63.0 in | Wt 113.0 lb

## 2015-01-14 DIAGNOSIS — E890 Postprocedural hypothyroidism: Secondary | ICD-10-CM | POA: Diagnosis not present

## 2015-01-14 DIAGNOSIS — R5383 Other fatigue: Secondary | ICD-10-CM | POA: Diagnosis not present

## 2015-01-14 MED ORDER — MINOXIDIL 2 % EX SOLN: Freq: Two times a day (BID) | CUTANEOUS | Status: DC

## 2015-01-14 NOTE — Progress Notes (Signed)
Patient ID: Samantha Graham, female   DOB: 03/11/1946, 69 y.o.   MRN: [id number]   Reason for Appointment:  Hypothyroidism, followup visit   History of Present Illness:   The hypothyroidism was first diagnosed in 1985  She has history of hyperthyroidism and thyroid nodule treated with thyroidectomy and ? Radioactive iodine but detailed records are not available of this According to her records since about 2001 had been taking 88 mcg brand name Synthroid  Since about 12/14 her TSH had been relatively low and her dose was reduced gradually to a final dose of 62.5 g She however was having complaints of extreme fatigue, brain fog and wanted to increase her dose to feel better  Because of her fatigue she was empirically started on Armour Thyroid in 10/15 with 45 g. She does feel less fatigued with this although this is not completely gone. She is now asking about thinning of her hair.  She continues to take some herbal supplements for various reasons but these do not have obvious any interactive substances The patient has been taking the thyroid supplement very regularly in the morning before breakfast.  Not taking any calcium or iron supplements with the thyroid supplement.  takes her calcium at night  Although her TSH was quite normal with initially starting the 45 mg Armour thyroid but is now relatively low  Lab Results  Component Value Date   TSH 0.34* 12/24/2014   TSH 0.60 06/16/2014   TSH 0.916 04/22/2014   FREET4 0.65 06/16/2014   FREET4 1.26 04/22/2014   FREET4 1.04 02/05/2014       No visits with results within 1 Week(s) from this visit. Latest known visit with results is:  Lab on 12/24/2014  Component Date Value Ref Range Status  . TSH 12/24/2014 0.34* 0.35 - 4.50 uIU/mL Final  . Sodium 12/24/2014 136  135 - 145 mEq/L Final  . Potassium 12/24/2014 3.9  3.5 - 5.1 mEq/L Final  . Chloride 12/24/2014 102  96 - 112 mEq/L Final  . CO2 12/24/2014 28  19 - 32  mEq/L Final  . Glucose, Bld 12/24/2014 96  70 - 99 mg/dL Final  . BUN 12/24/2014 28* 6 - 23 mg/dL Final  . Creatinine, Ser 12/24/2014 1.05  0.40 - 1.20 mg/dL Final  . Calcium 12/24/2014 9.3  8.4 - 10.5 mg/dL Final  . GFR 12/24/2014 55.27* >60.00 mL/min Final      Medication List       This list is accurate as of: 01/14/15  2:01 PM.  Always use your most recent med list.               ARMOUR THYROID 90 MG tablet  Generic drug:  thyroid  TAKE 1/2 A TABLET BY MOUTH DAILY     b complex-C-E-zinc tablet  Take 1 tablet by mouth daily.     BOOSTRIX 5-2.5-18.5 LF-MCG/0.5 injection  Generic drug:  Tdap     Fish Oil 1000 MG Caps  Take by mouth.     Vitamin D3 5000 UNITS Caps  Take by mouth.        Allergies: No Known Allergies  Past Medical History  Diagnosis Date  . Hyperlipidemia     Past Surgical History  Procedure Laterality Date  . Thyroidectomy  1985    Family History  Problem Relation Age of Onset  . Thyroid disease Mother   . Cancer Father   . Diabetes Cousin     Social History:  reports  that she has never smoked. She has never used smokeless tobacco. Her alcohol and drug histories are not on file.  REVIEW Of SYSTEMS:  Her weight has been stable. Blood pressure is relatively low but she does not complain of any lightheadedness, nausea or decreased appetite.  She thinks her blood pressure has been chronically low  Wt Readings from Last 3 Encounters:  01/14/15 113 lb (51.256 kg)  06/30/14 114 lb 9.6 oz (51.982 kg)  04/30/14 113 lb 12.8 oz (51.619 kg)    Osteoporosis: On her bone density done by gynecologist her T score was -3.0   She does take calcium and vitamin D  She was concerned about lack of efficacy of Fosamax with her mother Reclast was given in 5/16, she did have some nausea the next day  Vitamin D supplements: Taking 5000 units daily for deficiency; her last level was normal  Insomnia still present, taking OTC supplements  She is  asking for Rogaine for hair loss   Examination:   BP 94/58 mmHg  Pulse 75  Temp(Src) 97.7 F (36.5 C)  Resp 14  Ht 5\' 3"  (1.6 m)  Wt 113 lb (51.256 kg)  BMI 20.02 kg/m2  SpO2 93%  Standing blood pressure 92/60 GENERAL APPEARANCE: Looks well no puffiness of the face or eyes Biceps reflexes are normal No peripheral pigmentation Skin: No skin changes or peripheral edema    Assessment   Hypothyroidism, long-standing, postsurgical  Subjectively now she is feeling less tired with taking Armour Thyroid half of 90 mg compared to 62.5 g of Synthroid Clinically she appears euthyroid  TSH is however low normal at 0.34 Because of her known osteoporosis will need to reduce her dose is slightly   Osteoporosis with T score -3: Started on Reclast   Treatment:   Will continue Armour Thyroid 45 mg but take this only 6 days a week Will check TSH again in 6 months  We will schedule her for Reclast in May 2017 also  She can try Rogaine 2%, since her blood pressure is relatively low would not give her 5%   Alyssha Housh 01/14/2015, 2:01 PM

## 2015-05-11 ENCOUNTER — Other Ambulatory Visit (INDEPENDENT_AMBULATORY_CARE_PROVIDER_SITE_OTHER): Payer: PRIVATE HEALTH INSURANCE

## 2015-05-11 ENCOUNTER — Telehealth: Payer: Self-pay | Admitting: Endocrinology

## 2015-05-11 ENCOUNTER — Other Ambulatory Visit: Payer: Self-pay | Admitting: *Deleted

## 2015-05-11 DIAGNOSIS — E890 Postprocedural hypothyroidism: Secondary | ICD-10-CM

## 2015-05-11 LAB — TSH: TSH: 0.67 u[IU]/mL (ref 0.35–4.50)

## 2015-05-11 MED ORDER — THYROID 90 MG PO TABS: ORAL_TABLET | ORAL | Status: DC

## 2015-05-11 NOTE — Telephone Encounter (Signed)
Pt needs refill on armour thyroid med called [facility] just enough to hold her until tuesdays appt

## 2015-05-11 NOTE — Telephone Encounter (Signed)
Rx sent 

## 2015-05-13 ENCOUNTER — Other Ambulatory Visit: Payer: PRIVATE HEALTH INSURANCE

## 2015-05-18 ENCOUNTER — Ambulatory Visit (INDEPENDENT_AMBULATORY_CARE_PROVIDER_SITE_OTHER): Payer: PRIVATE HEALTH INSURANCE | Admitting: Endocrinology

## 2015-05-18 ENCOUNTER — Encounter: Payer: Self-pay | Admitting: Endocrinology

## 2015-05-18 VITALS — BP 100/64 | HR 59 | Temp 98.1°F | Resp 14 | Ht 63.0 in | Wt 109.8 lb

## 2015-05-18 DIAGNOSIS — E890 Postprocedural hypothyroidism: Secondary | ICD-10-CM

## 2015-05-18 MED ORDER — THYROID 90 MG PO TABS: ORAL_TABLET | ORAL | Status: DC

## 2015-05-18 NOTE — Progress Notes (Signed)
Patient ID: Samantha Graham, female   DOB: 08/07/1945, 69 y.o.   MRN: [id number]   Reason for Appointment:  Hypothyroidism, followup visit   History of Present Illness:   The hypothyroidism was first diagnosed in 1985  She has history of hyperthyroidism and thyroid nodule treated with thyroidectomy and ? Radioactive iodine but detailed records are not available of this According to her records since about 2001 had been taking 88 mcg brand name Synthroid  Since about 12/14 her TSH had been relatively low and her dose was reduced gradually to a final dose of 62.5 g She however was having complaints of extreme fatigue, brain fog and wanted to increase her dose to feel better  Because of her fatigue she was empirically started on Armour Thyroid in 10/15 with 45 g. She did feel less fatigued with this although because of her issues with insomnia she cannot judge the change as much recently Also her hair is better than before  The patient has been taking the thyroid supplement very regularly in the morning before breakfast.  Since her TSH was low normal at 0.34 in June she was told to skip the dose and on Sundays which she is doing  Not taking any calcium or iron supplements with the thyroid supplement.  takes her calcium at night  Now her TSH is back to normal   Lab Results  Component Value Date   TSH 0.67 05/11/2015   TSH 0.34* 12/24/2014   TSH 0.60 06/16/2014   FREET4 0.65 06/16/2014   FREET4 1.26 04/22/2014   FREET4 1.04 02/05/2014       No visits with results within 1 Week(s) from this visit. Latest known visit with results is:  Appointment on 05/11/2015  Component Date Value Ref Range Status  . TSH 05/11/2015 0.67  0.35 - 4.50 uIU/mL Final      Medication List       This list is accurate as of: 05/18/15 11:37 AM.  Always use your most recent med list.               b complex-C-E-zinc tablet  Take 1 tablet by mouth daily.     BOOSTRIX 5-2.5-18.5  LF-MCG/0.5 injection  Generic drug:  Tdap     Fish Oil 1000 MG Caps  Take by mouth.     minoxidil 2 % external solution  Commonly known as:  RA HAIR REGROWTH TREATMENT  Apply topically 2 (two) times daily.     thyroid 90 MG tablet  Commonly known as:  ARMOUR THYROID  TAKE 1/2 A TABLET BY MOUTH DAILY     Vitamin D3 5000 UNITS Caps  Take by mouth.        Allergies: No Known Allergies  Past Medical History  Diagnosis Date  . Hyperlipidemia     Past Surgical History  Procedure Laterality Date  . Thyroidectomy  1985    Family History  Problem Relation Age of Onset  . Thyroid disease Mother   . Cancer Father   . Diabetes Cousin     Social History:  reports that she has never smoked. She has never used smokeless tobacco. Her alcohol and drug histories are not on file.  REVIEW Of SYSTEMS:  Her weight has been stable. Blood pressure is relatively low but she does not complain of any lightheadedness, nausea or decreased appetite.  She thinks her blood pressure has been chronically low  Wt Readings from Last 3 Encounters:  05/18/15 109 lb 12.8  oz (49.805 kg)  01/14/15 113 lb (51.256 kg)  06/30/14 114 lb 9.6 oz (51.982 kg)    Osteoporosis: On her bone density done by gynecologist her T score was -3.0   She does take calcium and vitamin D  She was concerned about lack of efficacy of Fosamax with her mother Reclast was given in 5/16, she did have some nausea the next day  Vitamin D supplements: Taking 5000 units daily for deficiency; her last level was normal  Insomnia still present, taking OTC supplements  She is asking for Rogaine for hair loss   Examination:   BP 100/64 mmHg  Pulse 59  Temp(Src) 98.1 F (36.7 C)  Resp 14  Ht 5\' 3"  (1.6 m)  Wt 109 lb 12.8 oz (49.805 kg)  BMI 19.46 kg/m2  SpO2 98%  GENERAL APPEARANCE: Looks well no puffiness of the face   Biceps reflexes are normal to slightly brisk Heart rate is regular, 72    Assessment    Hypothyroidism, long-standing, postsurgical  Subjectively now she is feeling less tired with taking Armour Thyroid half of 90 mg compared to 62.5 g of Synthroid Clinically she appears euthyroid   TSH is quite normal now with taking the medication 6 days a week  Osteoporosis with T score -3: Started on Reclast and will have this again in 11/2015   Treatment:   Will continue Armour Thyroid 45 mg  6 days a week Will check TSH again in 12 months  She will discuss insomnia with PCP or otherwise try lifestyle changes and yoga   [name] 05/18/2015, 11:37 AM

## 2015-09-27 ENCOUNTER — Telehealth: Payer: Self-pay | Admitting: Nutrition

## 2015-09-27 NOTE — Telephone Encounter (Signed)
Prefer Actonel monthly dosage

## 2015-09-27 NOTE — Telephone Encounter (Signed)
Message left on my machine that when she had the Reclast last year, she had side effects from it for several weeks.  He family doctor suggested Boniva.  Will this be as effective?

## 2015-09-28 NOTE — Telephone Encounter (Signed)
Message left with husband about the Actonel

## 2015-10-04 ENCOUNTER — Other Ambulatory Visit: Payer: Self-pay | Admitting: *Deleted

## 2015-10-04 ENCOUNTER — Telehealth: Payer: Self-pay | Admitting: Nutrition

## 2015-10-04 MED ORDER — RISEDRONATE SODIUM 150 MG PO TABS: ORAL_TABLET | ORAL | Status: DC

## 2015-10-04 NOTE — Telephone Encounter (Signed)
Actonel 150 mg monthly

## 2015-10-04 NOTE — Telephone Encounter (Signed)
rx sent

## 2015-10-04 NOTE — Telephone Encounter (Signed)
Message left on my machine that she would prefer to go with the Actonel that you think in best for her, rather than what her general MD wants to give her.  Can you please call that prescription in to the [facility] in [place]

## 2015-10-05 NOTE — Telephone Encounter (Signed)
Patient told that prescription was called in

## 2015-10-07 ENCOUNTER — Telehealth: Payer: Self-pay | Admitting: Dietician

## 2015-10-07 NOTE — Telephone Encounter (Signed)
Patient called to speak with Samantha Basta, RN regarding prescription refill location.  Per her request, information obtained from her medical record.  Actonol prescription sent to [facility] in [place].  Antonieta Iba, RD, LDN

## 2016-02-22 ENCOUNTER — Other Ambulatory Visit: Payer: Self-pay | Admitting: Endocrinology

## 2016-02-22 NOTE — Telephone Encounter (Signed)
Refill for         risedronate (ACTONEL) 150 MG tablet        [facility] [address], [place] - [place] [phone] (Phone) [phone] (Fax)

## 2016-04-21 ENCOUNTER — Other Ambulatory Visit: Payer: Self-pay | Admitting: Family Medicine

## 2016-04-21 DIAGNOSIS — Z139 Encounter for screening, unspecified: Secondary | ICD-10-CM

## 2016-05-06 ENCOUNTER — Other Ambulatory Visit: Payer: Self-pay | Admitting: Endocrinology

## 2016-05-10 ENCOUNTER — Telehealth: Payer: Self-pay | Admitting: Endocrinology

## 2016-05-10 NOTE — Telephone Encounter (Signed)
Patient need refill of risedronate (ACTONEL) 150 MG tablet.   [place], [place] - [place] [phone] (Phone) [phone] (Fax)

## 2016-05-16 ENCOUNTER — Ambulatory Visit (INDEPENDENT_AMBULATORY_CARE_PROVIDER_SITE_OTHER): Payer: PRIVATE HEALTH INSURANCE

## 2016-05-16 DIAGNOSIS — Z1231 Encounter for screening mammogram for malignant neoplasm of breast: Secondary | ICD-10-CM

## 2016-05-16 DIAGNOSIS — Z139 Encounter for screening, unspecified: Secondary | ICD-10-CM

## 2016-06-15 ENCOUNTER — Other Ambulatory Visit: Payer: Self-pay | Admitting: Endocrinology

## 2016-07-26 ENCOUNTER — Other Ambulatory Visit: Payer: Self-pay | Admitting: Endocrinology

## 2016-07-26 DIAGNOSIS — E559 Vitamin D deficiency, unspecified: Secondary | ICD-10-CM

## 2016-07-26 DIAGNOSIS — E890 Postprocedural hypothyroidism: Secondary | ICD-10-CM

## 2016-07-27 ENCOUNTER — Other Ambulatory Visit: Payer: Self-pay | Admitting: Endocrinology

## 2016-08-01 ENCOUNTER — Other Ambulatory Visit (INDEPENDENT_AMBULATORY_CARE_PROVIDER_SITE_OTHER): Payer: Medicare HMO

## 2016-08-01 DIAGNOSIS — E890 Postprocedural hypothyroidism: Secondary | ICD-10-CM

## 2016-08-01 DIAGNOSIS — E559 Vitamin D deficiency, unspecified: Secondary | ICD-10-CM

## 2016-08-01 LAB — TSH: TSH: 1.48 u[IU]/mL (ref 0.35–4.50)

## 2016-08-01 LAB — T3, FREE: T3, Free: 2.8 pg/mL (ref 2.3–4.2)

## 2016-08-01 LAB — T4, FREE: Free T4: 0.77 ng/dL (ref 0.60–1.60)

## 2016-08-01 LAB — VITAMIN D 25 HYDROXY (VIT D DEFICIENCY, FRACTURES): VITD: 35.47 ng/mL (ref 30.00–100.00)

## 2016-08-03 ENCOUNTER — Ambulatory Visit (INDEPENDENT_AMBULATORY_CARE_PROVIDER_SITE_OTHER): Payer: Medicare HMO | Admitting: Endocrinology

## 2016-08-03 ENCOUNTER — Encounter: Payer: Self-pay | Admitting: Endocrinology

## 2016-08-03 ENCOUNTER — Other Ambulatory Visit: Payer: Self-pay

## 2016-08-03 VITALS — BP 90/62 | HR 63 | Ht 62.5 in | Wt 113.0 lb

## 2016-08-03 DIAGNOSIS — M810 Age-related osteoporosis without current pathological fracture: Secondary | ICD-10-CM | POA: Diagnosis not present

## 2016-08-03 DIAGNOSIS — E890 Postprocedural hypothyroidism: Secondary | ICD-10-CM

## 2016-08-03 DIAGNOSIS — E559 Vitamin D deficiency, unspecified: Secondary | ICD-10-CM | POA: Diagnosis not present

## 2016-08-03 MED ORDER — RISEDRONATE SODIUM 150 MG PO TABS: ORAL_TABLET | ORAL | 1 refills | Status: DC

## 2016-08-03 NOTE — Progress Notes (Addendum)
Patient ID: Samantha Graham, female   DOB: June 06, 1946, 72 y.o.   MRN: [id number]   Reason for Appointment:  Hypothyroidism, followup visit   History of Present Illness:   The hypothyroidism was first diagnosed in 1985  She has history of hyperthyroidism and thyroid nodule treated with thyroidectomy and ? Radioactive iodine but detailed records are not available of this According to her records since about 2001 had been taking 88 mcg brand name Synthroid  Since about 12/14 her TSH had been relatively low and her dose was reduced gradually to a final dose of 62.5 g She however was having complaints of extreme fatigue, brain fog and wanted to increase her dose to feel better  Because of her fatigue she was empirically started on Armour Thyroid in 04/2014 with 45 g. She did feel less fatigued with this Also her hair loss seemed better than before  The patient has been taking the thyroid supplement very regularly in the morning before breakfast.  Since her TSH was low normal at 0.34 in June she was told to skip the dose on Sundays, she does continue to take this 6 days a week now  She feels more tired at times but she thinks this is because of a lot of stressful events last year No recent weight change  Not taking any calcium or iron supplements with the thyroid supplement.  takes her calcium at night  Labs as follows, TSH consistently normal now  Lab Results  Component Value Date   TSH 1.48 08/01/2016   TSH 0.67 05/11/2015   TSH 0.34 (L) 12/24/2014   FREET4 0.77 08/01/2016   FREET4 0.65 06/16/2014   FREET4 1.26 04/22/2014     OTHER active problems: See review of systems   Lab on 08/01/2016  Component Date Value Ref Range Status  . VITD 08/01/2016 35.47  30.00 - 100.00 ng/mL Final  . Free T4 08/01/2016 0.77  0.60 - 1.60 ng/dL Final   Comment: Specimens from patients who are undergoing biotin therapy and /or ingesting biotin supplements may contain high levels of  biotin.  The higher biotin concentration in these specimens interferes with this Free T4 assay.  Specimens that contain high levels  of biotin may cause false high results for this Free T4 assay.  Please interpret results in light of the total clinical presentation of the patient.    [name] TSH 08/01/2016 1.48  0.35 - 4.50 uIU/mL Final  . T3, Free 08/01/2016 2.8  2.3 - 4.2 pg/mL Final    Allergies as of 08/03/2016   No Known Allergies     Medication List       Accurate as of 08/03/16 11:59 PM. Always use your most recent med list.          ARMOUR THYROID 90 MG tablet Generic drug:  thyroid TAKE 1/2 A TABLET BY MOUTH DAILY   b complex-C-E-zinc tablet Take 1 tablet by mouth daily.   BOOSTRIX 5-2.5-18.5 LF-MCG/0.5 injection Generic drug:  Tdap   Fish Oil 1000 MG Caps Take by mouth.   minoxidil 2 % external solution Commonly known as:  RA HAIR REGROWTH TREATMENT Apply topically 2 (two) times daily.   risedronate 150 MG tablet Commonly known as:  ACTONEL TAKE WITH WATER ON AN EMPTY STOMACH, NOTHING BY MOUTH OR LIE DOWN FOR THE NEXT 30 MINUTES   Vitamin D3 5000 units Caps Take by mouth.       Allergies: No Known Allergies  Past Medical History:  Diagnosis Date  . Hyperlipidemia     Past Surgical History:  Procedure Laterality Date  . THYROIDECTOMY  1985    Family History  Problem Relation Age of Onset  . Thyroid disease Mother   . Cancer Father   . Diabetes Cousin     Social History:  reports that she has never smoked. She has never used smokeless tobacco. Her alcohol and drug histories are not on file.  REVIEW Of SYSTEMS:  Her weight has been stable.   Wt Readings from Last 3 Encounters:  08/03/16 113 lb (51.3 kg)  05/18/15 109 lb 12.8 oz (49.8 kg)  01/14/15 113 lb (51.3 kg)    Osteoporosis: On her bone density done by gynecologist her T score was -3.0   She does take calcium and vitamin D  She was concerned about lack of efficacy of Fosamax with  her mother Reclast was given in 5/16, she did have some nausea the next day and does not want to take it again  She has been taking Actonel since 09/2015 without side effects, however she is concerned it may be causing hair loss  Vitamin D supplements: Taking 5000 units  for deficiency; now weekly only in the last month; her last level was normal   Blood pressure is Usually low   She thinks her blood pressure has been chronically low  She had tried Rogaine for Women for hair loss and caused local irritation    Examination:   BP 90/62   Pulse 63   Ht 5' 2.5" (1.588 m)   Wt 113 lb (51.3 kg)   SpO2 97%   BMI 20.34 kg/m   She looks well    Assessment /Plan  Hypothyroidism, long-standing, postsurgical  She is still doing fairly well subjectively with Armour Thyroid compared to levothyroxine She is takingArmour Thyroid half of 90 mg 6 days a week Clinically she appears euthyroid   TSH is quite normal again with taking the medication 6 days a week  Will continue Armour Thyroid 45 mg  6 days a week  OSTEOPOROSIS: She has been taking Actonel for nearly a year, reassured her about the safety and advised her to continue this for another 4 years Also discussed that if she is concerned about continue treatment she can also consider Prolia and she can let us know if she wants to change  She does need vitamin D supplementation also, she does not want to take 5000 units daily and advised her to take at least this dose 3 days a week   Follow-up:   Will check TSH again in 12 months   Patient Instructions  ?? Prolia for osteoporosis  Vitamin D at least 3x weekly    Samantha Graham 08/04/2016, 12:09 PM    Addendum: Bone density on 08/08/16 shows T scores of -2.4 at spine (improved) and -2.2 at hip, stable

## 2016-08-03 NOTE — Patient Instructions (Signed)
??   Prolia for osteoporosis  Vitamin D at least 3x weekly

## 2016-08-04 DIAGNOSIS — E890 Postprocedural hypothyroidism: Secondary | ICD-10-CM | POA: Insufficient documentation

## 2016-08-04 DIAGNOSIS — M810 Age-related osteoporosis without current pathological fracture: Secondary | ICD-10-CM | POA: Insufficient documentation

## 2016-08-08 DIAGNOSIS — M810 Age-related osteoporosis without current pathological fracture: Secondary | ICD-10-CM | POA: Diagnosis not present

## 2016-08-08 DIAGNOSIS — M8588 Other specified disorders of bone density and structure, other site: Secondary | ICD-10-CM | POA: Diagnosis not present

## 2016-08-29 ENCOUNTER — Telehealth: Payer: Self-pay | Admitting: Endocrinology

## 2016-08-29 NOTE — Telephone Encounter (Signed)
Bone density at the spine was improved from before, now borderline number for osteoporosis.  The bone density at the hip is unchanged.  Recommend continuing the Actonel, please Rx

## 2016-08-29 NOTE — Telephone Encounter (Signed)
Patient is calling reading of her bone scan

## 2016-08-29 NOTE — Telephone Encounter (Signed)
Samantha Graham called in and said that they have further questions that need to be answered in a time sensative manner. CB# [phone]

## 2016-08-29 NOTE — Telephone Encounter (Signed)
Please call to find out what they need?

## 2016-08-29 NOTE — Telephone Encounter (Signed)
Will need prior authorization done when the form is available

## 2016-08-29 NOTE — Telephone Encounter (Signed)
Pt called back in and is now asking about the coverage determination for her Armor Thyroid prescription. She stated they are faxing over another form.

## 2016-08-29 NOTE — Telephone Encounter (Signed)
Pt called in and said that [facility] never received her Actonel script, please resubmit to the [facility] on [address] in [place].

## 2016-09-03 NOTE — Telephone Encounter (Signed)
Insurance is probably Physicist, medical instead of Armour Thyroid.  This is just a little stronger than the Armour Thyroid  Needs new prescription for the 30 mg tablet to be taken once a day

## 2016-09-04 ENCOUNTER — Other Ambulatory Visit: Payer: Self-pay

## 2016-09-04 ENCOUNTER — Telehealth: Payer: Self-pay

## 2016-09-04 ENCOUNTER — Other Ambulatory Visit: Payer: Self-pay | Admitting: Endocrinology

## 2016-09-04 MED ORDER — LIOTRIX (T3-T4) 30 (6.25-25) MG (MCG) PO TABS: ORAL_TABLET | ORAL | 1 refills | Status: DC

## 2016-09-04 NOTE — Telephone Encounter (Signed)
Attempted to reach the patient and advised of message. Patient was not available and did not have a working Advertising account executive. Will try again at a later time.

## 2016-09-04 NOTE — Telephone Encounter (Signed)
Would you please order the Thyrolar for this patient I have tried but I do not know which one to choose based on mg the only options are in  [place] thank you

## 2016-09-05 NOTE — Telephone Encounter (Signed)
Patient called back and wanted to speak with you about her thyroid medication.

## 2016-09-05 NOTE — Telephone Encounter (Signed)
Pt called back and requested a call back tonight before we go home.  She said that this is very important she speak with someone.

## 2016-09-05 NOTE — Telephone Encounter (Signed)
Patient Patient is returning your call on the status of her meds.

## 2016-09-06 NOTE — Telephone Encounter (Signed)
Spoke with patient and she does not want to come off of Armour Thyroid but her insurance will not cover it- spoke with Dwyane Dee about the fax the patient sent over stating she wants him dispute it with [employer]- Dr. Dwyane Dee advised me to do a PA

## 2016-09-06 NOTE — Telephone Encounter (Signed)
Left a message on the voicemail: She was explained that instead of Armour Thyroid the insurance preferred medication has been called in and is not a Generic If she does not want to take generic Fosamax instead of Actonel then she will have to do the Reclast infusion once a year which may cost less

## 2016-09-12 ENCOUNTER — Other Ambulatory Visit: Payer: Self-pay | Admitting: Endocrinology

## 2016-09-14 ENCOUNTER — Other Ambulatory Visit: Payer: Self-pay

## 2016-09-14 MED ORDER — THYROID 90 MG PO TABS: ORAL_TABLET | ORAL | 3 refills | Status: DC

## 2016-09-14 MED ORDER — RISEDRONATE SODIUM 150 MG PO TABS: ORAL_TABLET | ORAL | 1 refills | Status: DC

## 2016-10-26 DIAGNOSIS — R69 Illness, unspecified: Secondary | ICD-10-CM | POA: Diagnosis not present

## 2016-10-31 ENCOUNTER — Other Ambulatory Visit (INDEPENDENT_AMBULATORY_CARE_PROVIDER_SITE_OTHER): Payer: Medicare HMO

## 2016-10-31 ENCOUNTER — Encounter (INDEPENDENT_AMBULATORY_CARE_PROVIDER_SITE_OTHER): Payer: Self-pay

## 2016-10-31 DIAGNOSIS — M810 Age-related osteoporosis without current pathological fracture: Secondary | ICD-10-CM | POA: Diagnosis not present

## 2016-10-31 DIAGNOSIS — E890 Postprocedural hypothyroidism: Secondary | ICD-10-CM

## 2016-10-31 DIAGNOSIS — E559 Vitamin D deficiency, unspecified: Secondary | ICD-10-CM | POA: Diagnosis not present

## 2016-10-31 LAB — COMPREHENSIVE METABOLIC PANEL
ALT: 15 U/L (ref 0–35)
AST: 22 U/L (ref 0–37)
Albumin: 4.3 g/dL (ref 3.5–5.2)
Alkaline Phosphatase: 35 U/L — ABNORMAL LOW (ref 39–117)
BUN: 26 mg/dL — ABNORMAL HIGH (ref 6–23)
CO2: 28 mEq/L (ref 19–32)
Calcium: 10 mg/dL (ref 8.4–10.5)
Chloride: 101 mEq/L (ref 96–112)
Creatinine, Ser: 0.92 mg/dL (ref 0.40–1.20)
GFR: 64.03 mL/min (ref >60.00)
Glucose, Bld: 100 mg/dL — ABNORMAL HIGH (ref 70–99)
Potassium: 4 mEq/L (ref 3.5–5.1)
Sodium: 135 mEq/L (ref 135–145)
Total Bilirubin: 0.4 mg/dL (ref 0.2–1.2)
Total Protein: 7.1 g/dL (ref 6.0–8.3)

## 2016-10-31 LAB — TSH: TSH: 2.5 u[IU]/mL (ref 0.35–4.50)

## 2016-10-31 LAB — VITAMIN D 25 HYDROXY (VIT D DEFICIENCY, FRACTURES): VITD: 69.9 ng/mL (ref 30.00–100.00)

## 2016-10-31 LAB — T4, FREE: Free T4: 0.68 ng/dL (ref 0.60–1.60)

## 2016-11-02 ENCOUNTER — Ambulatory Visit (INDEPENDENT_AMBULATORY_CARE_PROVIDER_SITE_OTHER): Payer: Medicare HMO | Admitting: Endocrinology

## 2016-11-02 ENCOUNTER — Encounter: Payer: Self-pay | Admitting: Endocrinology

## 2016-11-02 VITALS — BP 94/60 | HR 58 | Ht 63.0 in | Wt 117.0 lb

## 2016-11-02 DIAGNOSIS — E559 Vitamin D deficiency, unspecified: Secondary | ICD-10-CM

## 2016-11-02 DIAGNOSIS — E890 Postprocedural hypothyroidism: Secondary | ICD-10-CM

## 2016-11-02 DIAGNOSIS — M810 Age-related osteoporosis without current pathological fracture: Secondary | ICD-10-CM | POA: Diagnosis not present

## 2016-11-02 MED ORDER — ALENDRONATE SODIUM 70 MG PO TABS: 70.0000 mg | ORAL_TABLET | ORAL | 11 refills | Status: DC

## 2016-11-02 NOTE — Progress Notes (Signed)
Patient ID: Samantha Graham, female   DOB: 03-16-1946, 71 y.o.   MRN: [id number]   Reason for Appointment:  Hypothyroidism, followup visit   History of Present Illness:   The hypothyroidism was first diagnosed in 1985  She has history of hyperthyroidism and thyroid nodule treated with thyroidectomy and ? Radioactive iodine but detailed records are not available of this According to her records since about 2001 had been taking 88 mcg brand name Synthroid  Since about 12/14 her TSH had been relatively low and her dose was reduced gradually to a final dose of 62.5 g She however was having complaints of extreme fatigue, brain fog and wanted to increase her dose to feel better  Because of her fatigue she was empirically started on Armour Thyroid in 04/2014 with 45 g. She did feel less fatigued with this Also her hair loss seemed better than before  The patient has been taking the thyroid supplement very regularly in the morning before breakfast.  Since her TSH was low normal with the daily dose she was told to skip the dose on Sundays, she does continue to take this 6 days a week now  She feels more tired at times and somewhat more recently No recent weight change  Not taking any calcium or iron supplements with the thyroid supplement.  takes her calcium at night  Labs as follows, TSH consistently normal now  Lab Results  Component Value Date   TSH 2.50 10/31/2016   TSH 1.48 08/01/2016   TSH 0.67 05/11/2015   FREET4 0.68 10/31/2016   FREET4 0.77 08/01/2016   FREET4 0.65 06/16/2014     OTHER active problems: See review of systems   Lab on 10/31/2016  Component Date Value Ref Range Status  . TSH 10/31/2016 2.50  0.35 - 4.50 uIU/mL Final  . Free T4 10/31/2016 0.68  0.60 - 1.60 ng/dL Final   Comment: Specimens from patients who are undergoing biotin therapy and /or ingesting biotin supplements may contain high levels of biotin.  The higher biotin concentration in  these specimens interferes with this Free T4 assay.  Specimens that contain high levels  of biotin may cause false high results for this Free T4 assay.  Please interpret results in light of the total clinical presentation of the patient.    . Sodium 10/31/2016 135  135 - 145 mEq/L Final  . Potassium 10/31/2016 4.0  3.5 - 5.1 mEq/L Final  . Chloride 10/31/2016 101  96 - 112 mEq/L Final  . CO2 10/31/2016 28  19 - 32 mEq/L Final  . Glucose, Bld 10/31/2016 100* 70 - 99 mg/dL Final  . BUN 10/31/2016 26* 6 - 23 mg/dL Final  . Creatinine, Ser 10/31/2016 0.92  0.40 - 1.20 mg/dL Final  . Total Bilirubin 10/31/2016 0.4  0.2 - 1.2 mg/dL Final  . Alkaline Phosphatase 10/31/2016 35* 39 - 117 U/L Final  . AST 10/31/2016 22  0 - 37 U/L Final  . ALT 10/31/2016 15  0 - 35 U/L Final  . Total Protein 10/31/2016 7.1  6.0 - 8.3 g/dL Final  . Albumin 10/31/2016 4.3  3.5 - 5.2 g/dL Final  . Calcium 10/31/2016 10.0  8.4 - 10.5 mg/dL Final  . GFR 10/31/2016 64.03  >60.00 mL/min Final  . VITD 10/31/2016 69.90  30.00 - 100.00 ng/mL Final    Allergies as of 11/02/2016   No Known Allergies     Medication List       Accurate as  of 11/02/16  3:03 PM. Always use your most recent med list.          alendronate 70 MG tablet Commonly known as:  FOSAMAX Take 1 tablet (70 mg total) by mouth once a week. Take with a full glass of water on an empty stomach.   b complex-C-E-zinc tablet Take 1 tablet by mouth daily.   BOOSTRIX 5-2.5-18.5 LF-MCG/0.5 injection Generic drug:  Tdap   Fish Oil 1000 MG Caps Take by mouth.   thyroid 90 MG tablet Commonly known as:  ARMOUR THYROID TAKE 1/2 A TABLET BY MOUTH DAILY   Vitamin D3 5000 units Caps Take by mouth.       Allergies: No Known Allergies  Past Medical History:  Diagnosis Date  . Hyperlipidemia     Past Surgical History:  Procedure Laterality Date  . THYROIDECTOMY  1985    Family History  Problem Relation Age of Onset  . Thyroid disease Mother    . Cancer Father   . Diabetes Cousin     Social History:  reports that she has never smoked. She has never used smokeless tobacco. Her alcohol and drug histories are not on file.  REVIEW Of SYSTEMS:   She is again complaining of fatigue, also has chronic insomnia   Her weight has been gradually increasing, has not done much exercise.   Wt Readings from Last 3 Encounters:  11/02/16 117 lb (53.1 kg)  08/03/16 113 lb (51.3 kg)  05/18/15 109 lb 12.8 oz (49.8 kg)    Osteoporosis: On her bone density done by gynecologist her T score was -3.0     Bone density on 08/08/16 shows T scores of -2.4 at spine (improved) and -2.2 at hip, stable  She does take calcium and vitamin D  She was concerned about lack of efficacy of Fosamax with her mother  Reclast was given in 5/16, she did have some nausea the next day and does not want to take it again  She has been taking Actonel since 09/2015 without side effects, She does not want to take this now because it is costing her $100 this   Vitamin D supplements: Taking 5000 units  for deficiency; now daily    She thinks her blood pressure has been chronically low  She had tried Rogaine for Women for hair loss and caused local irritation    Examination:   BP 94/60   Pulse (!) 58   Ht 5\' 3"  (1.6 m)   Wt 117 lb (53.1 kg)   BMI 20.73 kg/m      Assessment /Plan  Hypothyroidism, long-standing, postsurgical  She is taking Armour Thyroid half of 90 mg 6 days a week Although she had issues with insurance not covering this she is agreeable to paying out of pocket Clinically she appears euthyroid and her fatigue is likely to be due to unrelated causes  TSH is quite normal again with taking the medication 6 days a week  Will continue Armour Thyroid 45 mg  6 days a week  OSTEOPOROSIS: She has been taking Actonel for nearly a year However she is concerned about the cost and even though she was previously refusing Fosamax she agrees to do this  now Discussed how this will be taken Bone density is improving and likely that this can be maintained with Fosamax and should reduce fracture risk also  Vitamin D level is high normal at about 70   Follow-up:  Continue same regimen except change in Fosamax as  above Will check TSH again in 12 months   she will reduce her vitamin D to every other day since her level is high normal    Patient Instructions  Vitamin D every 2 days    [facility] 11/02/2016, 3:03 PM

## 2016-11-02 NOTE — Patient Instructions (Signed)
Vitamin D every 2 days

## 2016-11-06 ENCOUNTER — Other Ambulatory Visit: Payer: Self-pay

## 2016-11-06 MED ORDER — THYROID 90 MG PO TABS: ORAL_TABLET | ORAL | 1 refills | Status: DC

## 2016-12-30 DIAGNOSIS — S83282S Other tear of lateral meniscus, current injury, left knee, sequela: Secondary | ICD-10-CM | POA: Diagnosis not present

## 2016-12-30 DIAGNOSIS — S83242S Other tear of medial meniscus, current injury, left knee, sequela: Secondary | ICD-10-CM | POA: Diagnosis not present

## 2017-01-19 ENCOUNTER — Telehealth: Payer: Self-pay

## 2017-01-19 NOTE — Telephone Encounter (Signed)
Patient is on the list for [employer] 2018 and may be a good candidate for an AWV. Please let me know if/when appt is scheduled.   

## 2017-03-01 DIAGNOSIS — M25562 Pain in left knee: Secondary | ICD-10-CM | POA: Diagnosis not present

## 2017-05-17 ENCOUNTER — Other Ambulatory Visit: Payer: Self-pay | Admitting: Endocrinology

## 2017-06-21 DIAGNOSIS — H16223 Keratoconjunctivitis sicca, not specified as Sjogren's, bilateral: Secondary | ICD-10-CM | POA: Diagnosis not present

## 2017-06-21 DIAGNOSIS — H524 Presbyopia: Secondary | ICD-10-CM | POA: Diagnosis not present

## 2017-06-21 DIAGNOSIS — H5203 Hypermetropia, bilateral: Secondary | ICD-10-CM | POA: Diagnosis not present

## 2017-06-21 DIAGNOSIS — H33322 Round hole, left eye: Secondary | ICD-10-CM | POA: Diagnosis not present

## 2017-06-21 DIAGNOSIS — H04123 Dry eye syndrome of bilateral lacrimal glands: Secondary | ICD-10-CM | POA: Diagnosis not present

## 2017-06-21 DIAGNOSIS — H2513 Age-related nuclear cataract, bilateral: Secondary | ICD-10-CM | POA: Diagnosis not present

## 2017-06-21 DIAGNOSIS — H1045 Other chronic allergic conjunctivitis: Secondary | ICD-10-CM | POA: Diagnosis not present

## 2017-07-19 ENCOUNTER — Encounter: Payer: Self-pay | Admitting: Endocrinology

## 2017-07-19 DIAGNOSIS — Z1231 Encounter for screening mammogram for malignant neoplasm of breast: Secondary | ICD-10-CM | POA: Diagnosis not present

## 2017-07-19 DIAGNOSIS — E785 Hyperlipidemia, unspecified: Secondary | ICD-10-CM | POA: Diagnosis not present

## 2017-07-19 DIAGNOSIS — E559 Vitamin D deficiency, unspecified: Secondary | ICD-10-CM | POA: Diagnosis not present

## 2017-07-19 DIAGNOSIS — Z0000 Encounter for general adult medical examination without abnormal findings: Secondary | ICD-10-CM | POA: Diagnosis not present

## 2017-07-19 DIAGNOSIS — M1120 Other chondrocalcinosis, unspecified site: Secondary | ICD-10-CM | POA: Diagnosis not present

## 2017-07-19 DIAGNOSIS — E612 Magnesium deficiency: Secondary | ICD-10-CM | POA: Diagnosis not present

## 2017-07-19 DIAGNOSIS — Z131 Encounter for screening for diabetes mellitus: Secondary | ICD-10-CM | POA: Diagnosis not present

## 2017-07-19 DIAGNOSIS — Z1211 Encounter for screening for malignant neoplasm of colon: Secondary | ICD-10-CM | POA: Diagnosis not present

## 2017-07-19 DIAGNOSIS — M8580 Other specified disorders of bone density and structure, unspecified site: Secondary | ICD-10-CM | POA: Diagnosis not present

## 2017-07-20 DIAGNOSIS — D72829 Elevated white blood cell count, unspecified: Secondary | ICD-10-CM | POA: Diagnosis not present

## 2017-07-21 DIAGNOSIS — G8929 Other chronic pain: Secondary | ICD-10-CM | POA: Diagnosis not present

## 2017-07-21 DIAGNOSIS — M25562 Pain in left knee: Secondary | ICD-10-CM | POA: Diagnosis not present

## 2017-07-23 ENCOUNTER — Telehealth: Payer: Self-pay | Admitting: Endocrinology

## 2017-07-23 NOTE — Telephone Encounter (Signed)
I would agree only if the LDL is over 160 despite a good low-cholesterol diet, need LDL report

## 2017-07-23 NOTE — Telephone Encounter (Signed)
Patient went to PCP for physical 07/19/17. Cholestrol level was 303-PCP wants to put her on cholesterol medication (Crestor 10 mg). Patient is concerned. Wants Dr. Dwyane Dee to review cholesterol and give his recommendation-she wants Dr Ronnie Derby opinion re: cholesterol medication-patient will fax labs to Dr. Dwyane Dee Lipid Panel: 303 Cholesterol 4.0 Cholesterol/HDL 75 HDLD  Please call patient to advise at ph# [phone] Cell# [phone]

## 2017-07-23 NOTE — Telephone Encounter (Signed)
Please see message.  Please advise. 

## 2017-07-25 ENCOUNTER — Other Ambulatory Visit: Payer: Self-pay | Admitting: Physician Assistant

## 2017-07-25 DIAGNOSIS — Z1239 Encounter for other screening for malignant neoplasm of breast: Secondary | ICD-10-CM

## 2017-07-25 NOTE — Telephone Encounter (Signed)
Patient want to know if you received a fax from [facility] concerning her lab results. She would also like a copy of her last visit. Please advise

## 2017-07-25 NOTE — Telephone Encounter (Signed)
Dr. Dwyane Dee, I have handed you the last lab results from [facility].  Please advise.

## 2017-07-25 NOTE — Telephone Encounter (Signed)
Her cholesterol is extremely high and agree with the medication

## 2017-07-25 NOTE — Telephone Encounter (Signed)
Called patient and left a voice message to let her know that I did receive a copy of the lab results from [facility] and received the LDL report today and handing to Dr. Dwyane Dee. I will also print out a copy of her last OV and send to her in the mail.

## 2017-07-26 NOTE — Telephone Encounter (Signed)
I contacted the patient and advised MD was ok with labs being done. Please order labs. Thanks!

## 2017-07-26 NOTE — Telephone Encounter (Signed)
Requested a call back from the patient to further discuss.  

## 2017-07-26 NOTE — Telephone Encounter (Signed)
She can come in if she wants but it is unlikely that it will correlate with the cholesterol

## 2017-07-26 NOTE — Telephone Encounter (Signed)
Patient called back and wanted to know if she should come in for a thyroid panel to see if it corollate's with the high cholesterol panel she recently had.

## 2017-07-26 NOTE — Telephone Encounter (Signed)
Patient is calling back to talk with you.

## 2017-07-26 NOTE — Telephone Encounter (Signed)
I contacted the patient and advised of MD's response. Requested a call back if the patient would like to discuss further.

## 2017-07-26 NOTE — Telephone Encounter (Signed)
Pt returned your call while you were back with a patient.

## 2017-07-30 ENCOUNTER — Other Ambulatory Visit (INDEPENDENT_AMBULATORY_CARE_PROVIDER_SITE_OTHER): Payer: Medicare HMO

## 2017-07-30 DIAGNOSIS — E890 Postprocedural hypothyroidism: Secondary | ICD-10-CM

## 2017-07-30 LAB — T3, FREE: T3, Free: 2.5 pg/mL (ref 2.3–4.2)

## 2017-07-30 LAB — T4, FREE: Free T4: 0.67 ng/dL (ref 0.60–1.60)

## 2017-07-30 LAB — TSH: TSH: 4 u[IU]/mL (ref 0.35–4.50)

## 2017-07-30 NOTE — Progress Notes (Signed)
Please call to let patient know that the thyroid test is still within normal range

## 2017-08-01 ENCOUNTER — Ambulatory Visit (INDEPENDENT_AMBULATORY_CARE_PROVIDER_SITE_OTHER): Payer: Medicare HMO

## 2017-08-01 DIAGNOSIS — Z1239 Encounter for other screening for malignant neoplasm of breast: Secondary | ICD-10-CM

## 2017-08-01 DIAGNOSIS — Z1231 Encounter for screening mammogram for malignant neoplasm of breast: Secondary | ICD-10-CM | POA: Diagnosis not present

## 2017-08-02 ENCOUNTER — Encounter: Payer: Self-pay | Admitting: Hematology

## 2017-08-02 ENCOUNTER — Inpatient Hospital Stay: Payer: Medicare HMO | Attending: Hematology | Admitting: Hematology

## 2017-08-02 ENCOUNTER — Telehealth: Payer: Self-pay | Admitting: Hematology

## 2017-08-02 VITALS — BP 124/68 | HR 66 | Temp 97.8°F | Resp 18 | Ht 63.0 in | Wt 112.5 lb

## 2017-08-02 DIAGNOSIS — D72820 Lymphocytosis (symptomatic): Secondary | ICD-10-CM

## 2017-08-02 DIAGNOSIS — C9110 Chronic lymphocytic leukemia of B-cell type not having achieved remission: Secondary | ICD-10-CM

## 2017-08-02 DIAGNOSIS — Z23 Encounter for immunization: Secondary | ICD-10-CM | POA: Insufficient documentation

## 2017-08-02 DIAGNOSIS — Z809 Family history of malignant neoplasm, unspecified: Secondary | ICD-10-CM | POA: Diagnosis not present

## 2017-08-02 DIAGNOSIS — Z90710 Acquired absence of both cervix and uterus: Secondary | ICD-10-CM | POA: Diagnosis not present

## 2017-08-02 NOTE — Patient Instructions (Signed)
Thank you for choosing [facility] to provide your oncology and hematology care.  To afford each patient quality time with our providers, please arrive 30 minutes before your scheduled appointment time.  If you arrive late for your appointment, you may be asked to reschedule.  We strive to give you quality time with our providers, and arriving late affects you and other patients whose appointments are after yours.   If you are a no show for multiple scheduled visits, you may be dismissed from the clinic at the providers discretion.    Again, thank you for choosing [facility], our hope is that these requests will decrease the amount of time that you wait before being seen by our physicians.  ______________________________________________________________________  Should you have questions after your visit to the [facility], please contact our office at [phone] between the hours of 8:30 and 4:30 p.m.    Voicemails left after 4:30p.m will not be returned until the following business day.    For prescription refill requests, please have your pharmacy contact us directly.  Please also try to allow 48 hours for prescription requests.    Please contact the scheduling department for questions regarding scheduling.  For scheduling of procedures such as PET scans, CT scans, MRI, Ultrasound, etc please contact central scheduling at [phone].    Resources For Cancer Patients and Caregivers:   Oncolink.org:  A wonderful resource for patients and healthcare providers for information regarding your disease, ways to tract your treatment, what to expect, etc.     American Cancer Society:  800-227-2345  Can help patients locate various types of support and financial assistance  Cancer Care: 1-800-813-HOPE (4673) Provides financial assistance, online support groups, medication/co-pay assistance.    [place] DSS:  336-641-3447 Where to apply for food  stamps, Medicaid, and utility assistance  Medicare Rights Center: 800-333-4114 Helps people with Medicare understand their rights and benefits, navigate the Medicare system, and secure the quality healthcare they deserve  SCAT: 336-333-6589 [place] [employer]'s shared-ride transportation service for eligible riders who have a disability that prevents them from riding the fixed route bus.    For additional information on assistance programs please contact our social worker:   Grier Hock/Abigail Elmore:  [phone]            

## 2017-08-02 NOTE — Telephone Encounter (Signed)
Scheduled appt per 1/10 los - Gave patient AVS and calender per los.  

## 2017-08-02 NOTE — Progress Notes (Signed)
HEMATOLOGY/ONCOLOGY CONSULTATION NOTE  Date of Service: 08/02/2017  Patient Care Team: Martinique, Betty G, MD as PCP - General (Family Medicine)  CHIEF COMPLAINTS/PURPOSE OF CONSULTATION:  Leucocytosis  HISTORY OF PRESENTING ILLNESS:   Samantha Graham is a wonderful 72 y.o. female who has been referred to [place] by Dr. Martinique, Betty G, MD for evaluation and management of incidentally noted leucocytosis/Lymphocytosis.  Patient is generally in good overall health and on labs done for her annual physical in 06/2017 on labs was incidentally noted to have leucocytosis of 21.7k with lymphocytosis of 17.7k. Nl hgb of 13.8 and nl platelet count of 200k. She notes no new fatigue, fevers/chills/night sweats, overt LN enlargement, abdominal pain or distension. No recent viral infection. It is unclear if she has been noted to have lymphocytosis prior to these labs (old labs not available to [place] currently).  She was referred to [place] for further evaluation of her lymphocytosis.  She notes she feels no differently than has has felt over the last few years.  Patient notes that she is a non smoker. No other recent chemical exposures. No PRBC transfusions in the past.   MEDICAL HISTORY:  Past Medical History:  Diagnosis Date  . Hyperlipidemia   Hypothyroidism  Rt frozen shoulder Osteopenia/osteoporosis  SURGICAL HISTORY: Past Surgical History:  Procedure Laterality Date  . THYROIDECTOMY  1985  hysterectomy with BSO due to prolpased uterus Bunionectomy Meniscal injury -rt knee Bladder cyst removal  SOCIAL HISTORY: Social History   Socioeconomic History  . Marital status: Married    Spouse name: Not on file  . Number of children: Not on file  . Years of education: Not on file  . Highest education level: Not on file  Social Needs  . Financial resource strain: Not on file  . Food insecurity - worry: Not on file  . Food insecurity - inability: Not on file  . Transportation needs -  medical: Not on file  . Transportation needs - non-medical: Not on file  Occupational History  . Not on file  Tobacco Use  . Smoking status: Never Smoker  . Smokeless tobacco: Never Used  Substance and Sexual Activity  . Alcohol use: Not on file  . Drug use: Not on file  . Sexual activity: Not on file  Other Topics Concern  . Not on file  Social History Narrative  . Not on file    FAMILY HISTORY: Family History  Problem Relation Age of Onset  . Thyroid disease Mother   . Cancer Father   . Diabetes Cousin     ALLERGIES:  has No Known Allergies.  MEDICATIONS:  Current Outpatient Medications  Medication Sig Dispense Refill  . alendronate (FOSAMAX) 70 MG tablet Take 1 tablet (70 mg total) by mouth once a week. Take with a full glass of water on an empty stomach. 4 tablet 11  . ARMOUR THYROID 90 MG tablet TAKE 1/2 A TABLET BY MOUTH DAILY 15 tablet 3  . B Complex-C-E-Zn (B COMPLEX-C-E-ZINC) tablet Take 1 tablet by mouth daily.    [name] BOOSTRIX 5-2.5-18.5 injection     . Cholecalciferol (VITAMIN D3) 5000 UNITS CAPS Take by mouth.    . Omega-3 Fatty Acids (FISH OIL) 1000 MG CAPS Take by mouth.    . thyroid (ARMOUR THYROID) 90 MG tablet TAKE 1/2 A TABLET BY MOUTH DAILY 45 tablet 1   No current facility-administered medications for this visit.     REVIEW OF SYSTEMS:    A 10+ POINT  REVIEW OF SYSTEMS WAS OBTAINED including neurology, dermatology, psychiatry, cardiac, respiratory, lymph, extremities, GI, GU, Musculoskeletal, constitutional, breasts, reproductive, HEENT.  All pertinent positives are noted in the HPI.  All others are negative.   PHYSICAL EXAMINATION: ECOG PERFORMANCE STATUS: 0 - Asymptomatic  . Vitals:   08/02/17 1522  BP: 124/68  Pulse: 66  Resp: 18  Temp: 97.8 F (36.6 C)  SpO2: 99%   Filed Weights   08/02/17 1522  Weight: 112 lb 8 oz (51 kg)   .Body mass index is 19.93 kg/m. NAD GENERAL:alert, in no acute distress and comfortable SKIN: no acute  rashes, no significant lesions EYES: conjunctiva are pink and non-injected, sclera anicteric OROPHARYNX: MMM, no exudates, no oropharyngeal erythema or ulceration NECK: supple, no JVD LYMPH:  no palpable lymphadenopathy in the cervical, axillary or inguinal regions LUNGS: clear to auscultation b/l with normal respiratory effort HEART: regular rate & rhythm ABDOMEN:  normoactive bowel sounds , non tender, not distended.no palpable hepatosplenomegaly Extremity: no pedal edema PSYCH: alert & oriented x 3 with fluent speech NEURO: no focal motor/sensory deficits  LABORATORY DATA:  I have reviewed the data as listed  . CBC Latest Ref Rng & Units 08/03/2017  WBC 3.9 - 10.3 K/uL 22.4(H)  Hemoglobin 11.6 - 15.9 g/dL 13.4  Hematocrit 34.8 - 46.6 % 41.0  Platelets 145 - 400 K/uL 217   . CBC    Component Value Date/Time   WBC 22.4 (H) 08/03/2017 1055   RBC 4.39 08/03/2017 1055   HGB 13.4 08/03/2017 1055   HCT 41.0 08/03/2017 1055   PLT 217 08/03/2017 1055   MCV 93.6 08/03/2017 1055   MCH 30.6 08/03/2017 1055   MCHC 32.7 08/03/2017 1055   RDW 14.1 08/03/2017 1055   LYMPHSABS 17.2 (H) 08/03/2017 1055   MONOABS 0.7 08/03/2017 1055   EOSABS 0.2 08/03/2017 1055   BASOSABS 0.2 (H) 08/03/2017 1055     . CMP Latest Ref Rng & Units 08/03/2017 10/31/2016 12/24/2014  Glucose 70 - 140 mg/dL 81 100(H) 96  BUN 7 - 26 mg/dL 20 26(H) 28(H)  Creatinine 0.40 - 1.20 mg/dL - 0.92 1.05  Sodium 136 - 145 mmol/L 140 135 136  Potassium 3.3 - 4.7 mmol/L 4.3 4.0 3.9  Chloride 98 - 109 mmol/L 104 101 102  CO2 22 - 29 mmol/L _0 Calcium 8.4 - 10.4 mg/dL 9.6 10.0 9.3  Total Protein 6.4 - 8.3 g/dL 7.2 7.1 -  Total Bilirubin 0.2 - 1.2 mg/dL 0.4 0.4 -  Alkaline Phos 40 - 150 U/L 41 35(L) -  AST 5 - 34 U/L 23 22 -  ALT 0 - 55 U/L 18 15 -     Component     Latest Ref Rng & Units 08/03/2017  Kappa free light chain     3.3 - 19.4 mg/L 15.0  Lamda free light chains     5.7 - 26.3 mg/L 13.0  Kappa,  lamda light chain ratio     0.26 - 1.65 1.15  LDH     125 - 245 U/L 173     RADIOGRAPHIC STUDIES: I have personally reviewed the radiological images as listed and agreed with the findings in the report. Mm Digital Screening Bilateral  Result Date: 08/01/2017 CLINICAL DATA:  Screening. EXAM: DIGITAL SCREENING BILATERAL MAMMOGRAM WITH CAD COMPARISON:  Previous exam(s). ACR Breast Density Category b: There are scattered areas of fibroglandular density. FINDINGS: There are no findings suspicious for malignancy. Images were processed with CAD. IMPRESSION:  No mammographic evidence of malignancy. A result letter of this screening mammogram will be mailed directly to the patient. RECOMMENDATION: Screening mammogram in one year. (Code:SM-B-01Y) BI-RADS CATEGORY  1: Negative. Electronically Signed   By: Abelardo Diesel M.D.   On: 08/01/2017 11:16    ASSESSMENT & PLAN:   72 yo female with   1) Lymphocytosis  Newly noted - incidentally on annual labs. No older labs available for reference. No other associated cytopenias or cytosis. Plan -labs were done to determine if the lymphocytosis was clonal. 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 Based on flow cytometry No associated type B symptoms or cytopenias. No clinical evidence of LNadenopathy or hepato-splenomegaly. . Lab Results  Component Value Date   LDH 173 08/03/2017    Plan -patient is to f/u in clinic to discuss this new diagnosis of CLL in details -CLL FISH prognostic profile has been sent and results are pending. -PBS reviewed and is consistent with CLL. -LDH WNL -no overt indication for treatment of the patients newly diagnosed CLL at this time.  .3) . Patient Active Problem List   Diagnosis Date Noted  . Age related osteoporosis 08/04/2016  . Postsurgical hypothyroidism 08/04/2016  Plan -conitnue f/u with PCP.   Orders Placed This Encounter    Procedures  . CBC & Diff and Retic    Standing Status:   Future    Number of Occurrences:   1    Standing Expiration Date:   08/02/2018  . CMP ([facility] only)    Standing Status:   Future    Number of Occurrences:   1    Standing Expiration Date:   08/02/2018  . Smear    Standing Status:   Future    Number of Occurrences:   1    Standing Expiration Date:   08/02/2018  . Flow Cytometry    Lymphocytosis -- concerning for lymphoproliferative process likely CLL    Standing Status:   Future    Number of Occurrences:   1    Standing Expiration Date:   08/02/2018  . Lactate dehydrogenase    Standing Status:   Future    Number of Occurrences:   1    Standing Expiration Date:   08/02/2018  . Multiple Myeloma Panel (SPEP&IFE w/QIG)    Standing Status:   Future    Number of Occurrences:   1    Standing Expiration Date:   08/02/2018  . Kappa/lambda light chains    Standing Status:   Future    Number of Occurrences:   1    Standing Expiration Date:   09/06/2018  . FISH, CLL Prognostic Panel    Standing Status:   Future    Number of Occurrences:   1    Standing Expiration Date:   08/02/2018     No orders of the defined types were placed in this encounter.  All of the patients questions were answered with apparent satisfaction. The patient knows to call the clinic with any problems, questions or concerns.  I spent 40 minutes counseling the patient face to face. The total time spent in the appointment was 50 minutes and more than 50% was on counseling and direct patient cares.    Sullivan Lone MD [place] AAHIVMS [facility] Hematology/Oncology Physician [facility]  (Office):       [phone] (Work cell):  [phone] (Fax):  [phone]  08/02/2017 1:32 PM  This document serves as a record of services personally performed by Sullivan Lone, MD. It was created on his behalf by Margit Banda, a trained medical scribe. The creation of this record is based on the  scribe's personal observations and the provider's statements to them.   .I have reviewed the above documentation for accuracy and completeness, and I agree with the above. Brunetta Genera MD MS

## 2017-08-03 ENCOUNTER — Inpatient Hospital Stay: Payer: Medicare HMO

## 2017-08-03 DIAGNOSIS — Z23 Encounter for immunization: Secondary | ICD-10-CM | POA: Diagnosis not present

## 2017-08-03 DIAGNOSIS — Z90710 Acquired absence of both cervix and uterus: Secondary | ICD-10-CM | POA: Diagnosis not present

## 2017-08-03 DIAGNOSIS — C9190 Lymphoid leukemia, unspecified not having achieved remission: Secondary | ICD-10-CM | POA: Diagnosis not present

## 2017-08-03 DIAGNOSIS — C9110 Chronic lymphocytic leukemia of B-cell type not having achieved remission: Secondary | ICD-10-CM

## 2017-08-03 DIAGNOSIS — D72820 Lymphocytosis (symptomatic): Secondary | ICD-10-CM

## 2017-08-03 DIAGNOSIS — Z809 Family history of malignant neoplasm, unspecified: Secondary | ICD-10-CM | POA: Diagnosis not present

## 2017-08-03 LAB — CMP (CANCER CENTER ONLY)
ALT: 18 U/L (ref 0–55)
AST: 23 U/L (ref 5–34)
Albumin: 4 g/dL (ref 3.5–5.0)
Alkaline Phosphatase: 41 U/L (ref 40–150)
Anion gap: 10 (ref 3–11)
BUN: 20 mg/dL (ref 7–26)
CO2: 26 mmol/L (ref 22–29)
Calcium: 9.6 mg/dL (ref 8.4–10.4)
Chloride: 104 mmol/L (ref 98–109)
Creatinine: 1 mg/dL (ref 0.60–1.10)
GFR, Est AFR Am: >60 mL/min (ref >60)
GFR, Estimated: 55 mL/min — ABNORMAL LOW (ref >60)
Glucose, Bld: 81 mg/dL (ref 70–140)
Potassium: 4.3 mmol/L (ref 3.3–4.7)
Sodium: 140 mmol/L (ref 136–145)
Total Bilirubin: 0.4 mg/dL (ref 0.2–1.2)
Total Protein: 7.2 g/dL (ref 6.4–8.3)

## 2017-08-03 LAB — CBC WITH DIFFERENTIAL/PLATELET
Basophils Absolute: 0.2 10*3/uL — ABNORMAL HIGH (ref 0.0–0.1)
Basophils Relative: 1 %
Eosinophils Absolute: 0.2 10*3/uL (ref 0.0–0.5)
Eosinophils Relative: 1 %
HCT: 41 % (ref 34.8–46.6)
Hemoglobin: 13.4 g/dL (ref 11.6–15.9)
Lymphocytes Relative: 77 %
Lymphs Abs: 17.2 10*3/uL — ABNORMAL HIGH (ref 0.9–3.3)
MCH: 30.6 pg (ref 25.1–34.0)
MCHC: 32.7 g/dL (ref 31.5–36.0)
MCV: 93.6 fL (ref 79.5–101.0)
Monocytes Absolute: 0.7 10*3/uL (ref 0.1–0.9)
Monocytes Relative: 3 %
Neutro Abs: 4.1 10*3/uL (ref 1.5–6.5)
Neutrophils Relative %: 18 %
Platelets: 217 10*3/uL (ref 145–400)
RBC: 4.39 MIL/uL (ref 3.70–5.45)
RDW: 14.1 % (ref 11.2–16.1)
WBC: 22.4 10*3/uL — ABNORMAL HIGH (ref 3.9–10.3)

## 2017-08-03 LAB — RETICULOCYTES
RBC.: 4.42 MIL/uL (ref 3.70–5.45)
Retic Count, Absolute: 44.2 10*3/uL (ref 33.7–90.7)
Retic Ct Pct: 1 % (ref 0.7–2.1)

## 2017-08-03 LAB — SAVE SMEAR

## 2017-08-03 LAB — LACTATE DEHYDROGENASE: LDH: 173 U/L (ref 125–245)

## 2017-08-06 LAB — KAPPA/LAMBDA LIGHT CHAINS
Kappa free light chain: 15 mg/L (ref 3.3–19.4)
Kappa, lambda light chain ratio: 1.15 (ref 0.26–1.65)
Lambda free light chains: 13 mg/L (ref 5.7–26.3)

## 2017-08-07 LAB — MULTIPLE MYELOMA PANEL, SERUM
Albumin SerPl Elph-Mcnc: 3.9 g/dL (ref 2.9–4.4)
Albumin/Glob SerPl: 1.4 (ref 0.7–1.7)
Alpha 1: 0.2 g/dL (ref 0.0–0.4)
Alpha2 Glob SerPl Elph-Mcnc: 0.7 g/dL (ref 0.4–1.0)
B-Globulin SerPl Elph-Mcnc: 1 g/dL (ref 0.7–1.3)
Gamma Glob SerPl Elph-Mcnc: 1 g/dL (ref 0.4–1.8)
Globulin, Total: 2.9 g/dL (ref 2.2–3.9)
IgA: 114 mg/dL (ref 64–422)
IgG (Immunoglobin G), Serum: 1017 mg/dL (ref 700–1600)
IgM (Immunoglobulin M), Srm: 72 mg/dL (ref 26–217)
Total Protein ELP: 6.8 g/dL (ref 6.0–8.5)

## 2017-08-14 NOTE — Progress Notes (Signed)
HEMATOLOGY/ONCOLOGY CONSULTATION NOTE  Date of Service: 08/15/2017  Patient Care Team: Martinique, Betty G, MD as PCP - General (Family Medicine)  CHIEF COMPLAINTS/PURPOSE OF CONSULTATION:  F/u for newly diagnosed CLL  HISTORY OF PRESENTING ILLNESS:   Samantha Graham is a wonderful 72 y.o. female who has been referred to [place] by Dr. Martin[place], Betty G, MD for evaluation and management of incidentally noted leucocytosis/Lymphocytosis.  Patient is generally in good overall health and on labs done for her annual physical in 06/2017 on labs was incidentally noted to have leucocytosis of 21.7k with lymphocytosis of 17.7k. Nl hgb of 13.8 and nl platelet count of 200k. She notes no new fatigue, fevers/chills/night sweats, overt LN enlargement, abdominal pain or distension. No recent viral infection. It is unclear if she has been noted to have lymphocytosis prior to these labs (old labs not available to [place] currently).  She was referred to [place] for further evaluation of her lymphocytosis.  She notes she feels no differently than has has felt over the last few years.  Patient notes that she is a non smoker. No other recent chemical exposures. No PRBC transfusions in the past.  Interval History:  Samantha Graham is a wonderful 72 y.o. female who is following up today regarding her new diagnosis of Chronic Lymphocytic Leukemia. Her last visit with [place] was on 08/02/17. The pt reports that she is doing well overall and is enjoying retirement and hopes to travel. She notes frequent hospital volunteering and has not had a problem with infections to date.   Last available lab results (08/03/17) of CBC, MMP, LDH, CMP, Kappa/lambda, and Reticulocytes is as follows: all values are WNL except for WBC at 22.4k, Lymph Abs at 17.2k, Basophils Abs at 0.2k. The pt had a peripheral blood Smear revealing atypical Lymphs; smudge cells. Her preipheral blood flow cytometry showed findings consistent with CLL.  Pt was  treated with a infusion from her endocrinologist who, per pt, diagnosed her with osteoporosis and osteopenia. Pt took Fosamax monthly. Noticed in July she had joint pains.  We discussed in details her new diagnosis of CLL, staigng, natural history and prognostic parameters and treatment rationale.  On review of systems, pt reports feeling well overall and denies any changing constitutional symptoms.    MEDICAL HISTORY:  Past Medical History:  Diagnosis Date  . Hyperlipidemia   Hypothyroidism  Rt frozen shoulder Osteopenia/osteoporosis  SURGICAL HISTORY: Past Surgical History:  Procedure Laterality Date  . THYROIDECTOMY  1985  hysterectomy with BSO due to prolpased uterus Bunionectomy Meniscal injury -rt knee Bladder cyst removal  SOCIAL HISTORY: Social History   Socioeconomic History  . Marital status: Married    Spouse name: Not on file  . Number of children: Not on file  . Years of education: Not on file  . Highest education level: Not on file  Social Needs  . Financial resource strain: Not on file  . Food insecurity - worry: Not on file  . Food insecurity - inability: Not on file  . Transportation needs - medical: Not on file  . Transportation needs - non-medical: Not on file  Occupational History  . Not on file  Tobacco Use  . Smoking status: Never Smoker  . Smokeless tobacco: Never Used  Substance and Sexual Activity  . Alcohol use: No    Frequency: Never  . Drug use: No  . Sexual activity: Not on file  Other Topics Concern  . Not on file  Social History Narrative  .  Not on file    FAMILY HISTORY: Family History  Problem Relation Age of Onset  . Thyroid disease Mother   . Cancer Father   . Diabetes Cousin     ALLERGIES:  has No Known Allergies.  MEDICATIONS:  Current Outpatient Medications  Medication Sig Dispense Refill  . ARMOUR THYROID 90 MG tablet TAKE 1/2 A TABLET BY MOUTH DAILY (Patient taking differently: TAKE 1/2 A TABLET BY MOUTH  DAILY, not on sunday) 15 tablet 3  . B Complex-C-E-Zn (B COMPLEX-C-E-ZINC) tablet Take 1 tablet by mouth daily.    [name] BOOSTRIX 5-2.5-18.5 injection     . Cholecalciferol (VITAMIN D3) 5000 UNITS CAPS Take by mouth.    . Omega-3 Fatty Acids (FISH OIL) 1000 MG CAPS Take by mouth.     No current facility-administered medications for this visit.     REVIEW OF SYSTEMS:    A 10+ POINT REVIEW OF SYSTEMS WAS OBTAINED including neurology, dermatology, psychiatry, cardiac, respiratory, lymph, extremities, GI, GU, Musculoskeletal, constitutional, breasts, reproductive, HEENT.  All pertinent positives are noted in the HPI.  All others are negative.   PHYSICAL EXAMINATION: ECOG PERFORMANCE STATUS: 0 - Asymptomatic  . Vitals:   08/15/17 1357  BP: 137/74  Pulse: 78  Resp: 18  Temp: 98.4 F (36.9 C)  SpO2: 99%   Filed Weights   08/15/17 1357  Weight: 111 lb 9.6 oz (50.6 kg)   .Body mass index is 19.77 kg/m. NAD GENERAL:alert, in no acute distress and comfortable SKIN: no acute rashes, no significant lesions EYES: conjunctiva are pink and non-injected, sclera anicteric OROPHARYNX: MMM, no exudates, no oropharyngeal erythema or ulceration NECK: supple, no JVD LYMPH:  no palpable lymphadenopathy in the cervical, axillary or inguinal regions LUNGS: clear to auscultation b/l with normal respiratory effort HEART: regular rate & rhythm ABDOMEN:  normoactive bowel sounds , non tender, not distended.no palpable hepatosplenomegaly Extremity: no pedal edema PSYCH: alert & oriented x 3 with fluent speech NEURO: no focal motor/sensory deficits  LABORATORY DATA:  I have reviewed the data as listed  . CBC Latest Ref Rng & Units 08/03/2017  WBC 3.9 - 10.3 K/uL 22.4(H)  Hemoglobin 11.6 - 15.9 g/dL 13.4  Hematocrit 34.8 - 46.6 % 41.0  Platelets 145 - 400 K/uL 217   . CBC    Component Value Date/Time   WBC 22.4 (H) 08/03/2017 1055   RBC 4.39 08/03/2017 1055   HGB 13.4 08/03/2017 1055   HCT  41.0 08/03/2017 1055   PLT 217 08/03/2017 1055   MCV 93.6 08/03/2017 1055   MCH 30.6 08/03/2017 1055   MCHC 32.7 08/03/2017 1055   RDW 14.1 08/03/2017 1055   LYMPHSABS 17.2 (H) 08/03/2017 1055   MONOABS 0.7 08/03/2017 1055   EOSABS 0.2 08/03/2017 1055   BASOSABS 0.2 (H) 08/03/2017 1055     . CMP Latest Ref Rng & Units 08/03/2017 10/31/2016 12/24/2014  Glucose 70 - 140 mg/dL 81 100(H) 96  BUN 7 - 26 mg/dL 20 26(H) 28(H)  Creatinine 0.40 - 1.20 mg/dL - 0.92 1.05  Sodium 136 - 145 mmol/L 140 135 136  Potassium 3.3 - 4.7 mmol/L 4.3 4.0 3.9  Chloride 98 - 109 mmol/L 104 101 102  CO2 22 - 29 mmol/L 26 28 28   Calcium 8.4 - 10.4 mg/dL 9.6 10.0 9.3  Total Protein 6.4 - 8.3 g/dL 7.2 7.1 -  Total Bilirubin 0.2 - 1.2 mg/dL 0.4 0.4 -  Alkaline Phos 40 - 150 U/L 41 35(L) -  AST 5 -  34 U/L 23 22 -  ALT 0 - 55 U/L 18 15 -     Component     Latest Ref Rng & Units 08/03/2017  Kappa free light chain     3.3 - 19.4 mg/L 15.0  Lamda free light chains     5.7 - 26.3 mg/L 13.0  Kappa, lamda light chain ratio     0.26 - 1.65 1.15  LDH     125 - 245 U/L 173        RADIOGRAPHIC STUDIES:  I have personally reviewed the radiological images as listed and agreed with the findings in the report. Mm Digital Screening Bilateral  Result Date: 08/01/2017 CLINICAL DATA:  Screening. EXAM: DIGITAL SCREENING BILATERAL MAMMOGRAM WITH CAD COMPARISON:  Previous exam(s). ACR Breast Density Category b: There are scattered areas of fibroglandular density. FINDINGS: There are no findings suspicious for malignancy. Images were processed with CAD. IMPRESSION: No mammographic evidence of malignancy. A result letter of this screening mammogram will be mailed directly to the patient. RECOMMENDATION: Screening mammogram in one year. (Code:SM-B-01Y) BI-RADS CATEGORY  1: Negative. Electronically Signed   By: Abelardo Diesel M.D.   On: 08/01/2017 11:16    ASSESSMENT & PLAN:   72 yo female with   1) Newly diagnosed  Chronic Lymphocytic Leukemia -likely Rai Stage 0. No anemia/thrombocytopenia/clinicallyovert splenomegaly or LNadenoapathy. No constitutional symptoms. Newly noted - incidentally on annual labs. No older labs available for reference. . Lab Results  Component Value Date   LDH 173 08/03/2017    Plan -labs results were discussed in details with the patient. -We discussed in details her new diagnosis of CLL, staging, natural history and prognostic parameters and treatment rationale. -we discussed that at this time she does not have any clear indication for treatment of her CLL -Provided supplemental reading information about CLL to the pt  -Advised lifestyle and diet consistent with aiding her immune system -Explained to pt that symptoms such as new anemia, how soon the lymphocytes double, the appearance of constitutional symptoms, weight loss, large cell transformation would cause [place] to reexamine our treatment strategy.  -Discussed conducting a CLL prognostic gene panel and the markers we will look for such as 11Q and 17P mutations, or a 13Q deletion.  -Prevnar vaccine today -Pneumovax on f/u in 3 months -CT chest/abd/pelvis wo contrast. (patient prefers to avoid IV contrast) - to evaluate for LNadenopathy and splenomegaly (Initial staging)  .3) . Patient Active Problem List   Diagnosis Date Noted  . Age related osteoporosis 08/04/2016  . 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     Orders Placed This Encounter  Procedures  . CT Chest Wo Contrast    Standing Status:   Future    Standing Expiration Date:   08/15/2018    Order Specific Question:   Preferred imaging location?    Answer:   [facility]    Order Specific Question:   Radiology Contrast Protocol - do NOT remove file path    Answer:   \\[email or web address]    Order Specific Question:   Reason for Exam additional comments    Answer:   CLL initial staging  . CT  Abdomen Pelvis Wo Contrast    Standing Status:   Future    Standing Expiration Date:   08/15/2018    Order Specific Question:   Preferred imaging location?    Answer:   [facility]    Order Specific Question:   Radiology Contrast Protocol -  do NOT remove file path    Answer:   \\[email or web address]    Order Specific Question:   Reason for Exam additional comments    Answer:   CLL/SLL initial staging workup  . CBC & Diff and Retic    Standing Status:   Future    Standing Expiration Date:   08/15/2018  . CMP ([place] only)    Standing Status:   Future    Standing Expiration Date:   08/15/2018  . Lactate dehydrogenase    Standing Status:   Future    Standing Expiration Date:   08/15/2018   Prevnar vaccine today CT Chest/abd/pelvis wo contrast RTC with Dr Irene Limbo in 4 months with labs  All of the patients questions were answered with apparent satisfaction. The patient knows to call the clinic with any problems, questions or concerns.  I spent 25 minutes counseling the patient face to face. The total time spent in the appointment was 30 minutes and more than 50% was on counseling and direct patient cares.    Sullivan Lone MD MS AAHIVMS [facility] Hematology/Oncology Physician [facility]  (Office):       [phone] (Work cell):  [phone] (Fax):           [phone]  08/15/2017 2:40 PM  This document serves as a record of services personally performed by Sullivan Lone, MD. It was created on his behalf by Baldwin Jamaica, a trained medical scribe. The creation of this record is based on the scribe's personal observations and the provider's statements to them.   .I have reviewed the above documentation for accuracy and completeness, and I agree with the above. Brunetta Genera MD MS

## 2017-08-15 ENCOUNTER — Encounter: Payer: Self-pay | Admitting: Hematology

## 2017-08-15 ENCOUNTER — Telehealth: Payer: Self-pay | Admitting: Hematology

## 2017-08-15 ENCOUNTER — Inpatient Hospital Stay (HOSPITAL_BASED_OUTPATIENT_CLINIC_OR_DEPARTMENT_OTHER): Payer: Medicare HMO | Admitting: Hematology

## 2017-08-15 VITALS — BP 137/74 | HR 78 | Temp 98.4°F | Resp 18 | Ht 63.0 in | Wt 111.6 lb

## 2017-08-15 DIAGNOSIS — C9110 Chronic lymphocytic leukemia of B-cell type not having achieved remission: Secondary | ICD-10-CM | POA: Diagnosis not present

## 2017-08-15 DIAGNOSIS — M810 Age-related osteoporosis without current pathological fracture: Secondary | ICD-10-CM

## 2017-08-15 DIAGNOSIS — Z809 Family history of malignant neoplasm, unspecified: Secondary | ICD-10-CM | POA: Diagnosis not present

## 2017-08-15 DIAGNOSIS — Z23 Encounter for immunization: Secondary | ICD-10-CM | POA: Diagnosis not present

## 2017-08-15 DIAGNOSIS — D72820 Lymphocytosis (symptomatic): Secondary | ICD-10-CM | POA: Diagnosis not present

## 2017-08-15 DIAGNOSIS — Z90710 Acquired absence of both cervix and uterus: Secondary | ICD-10-CM | POA: Diagnosis not present

## 2017-08-15 MED ORDER — PNEUMOCOCCAL 13-VAL CONJ VACC IM SUSP
0.5000 mL | Freq: Once | INTRAMUSCULAR | Status: AC
  Administered 2017-08-15: 0.5 mL via INTRAMUSCULAR
  Filled 2017-08-15: qty 0.5

## 2017-08-15 NOTE — Patient Instructions (Addendum)
Thank you for choosing [facility] to provide your oncology and hematology care.  To afford each patient quality time with our providers, please arrive 30 minutes before your scheduled appointment time.  If you arrive late for your appointment, you may be asked to reschedule.  We strive to give you quality time with our providers, and arriving late affects you and other patients whose appointments are after yours.   If you are a no show for multiple scheduled visits, you may be dismissed from the clinic at the providers discretion.    Again, thank you for choosing [facility], our hope is that these requests will decrease the amount of time that you wait before being seen by our physicians.  ______________________________________________________________________  Should you have questions after your visit to the [facility], please contact our office at [phone] between the hours of 8:30 and 4:30 p.m.    Voicemails left after 4:30p.m will not be returned until the following business day.    For prescription refill requests, please have your pharmacy contact us directly.  Please also try to allow 48 hours for prescription requests.    Please contact the scheduling department for questions regarding scheduling.  For scheduling of procedures such as PET scans, CT scans, MRI, Ultrasound, etc please contact central scheduling at [phone].    Resources For Cancer Patients and Caregivers:   Oncolink.org:  A wonderful resource for patients and healthcare providers for information regarding your disease, ways to tract your treatment, what to expect, etc.     American Cancer Society:  800-227-2345  Can help patients locate various types of support and financial assistance  Cancer Care: 1-800-813-HOPE (4673) Provides financial assistance, online support groups, medication/co-pay assistance.    [place] DSS:  336-641-3447 Where to apply for food  stamps, Medicaid, and utility assistance  Medicare Rights Center: 800-333-4114 Helps people with Medicare understand their rights and benefits, navigate the Medicare system, and secure the quality healthcare they deserve  SCAT: 336-333-6589 [place] [employer]'s shared-ride transportation service for eligible riders who have a disability that prevents them from riding the fixed route bus.    For additional information on assistance programs please contact our social worker:   Grier Hock/Abigail Elmore:  [phone]   Pneumococcal Conjugate Vaccine suspension for injection What is this medicine? PNEUMOCOCCAL VACCINE (NEU mo KOK al vak SEEN) is a vaccine used to prevent pneumococcus bacterial infections. These bacteria can cause serious infections like pneumonia, meningitis, and blood infections. This vaccine will lower your chance of getting pneumonia. If you do get pneumonia, it can make your symptoms milder and your illness shorter. This vaccine will not treat an infection and will not cause infection. This vaccine is recommended for infants and young children, adults with certain medical conditions, and adults 65 years or older. This medicine may be used for other purposes; ask your health care provider or pharmacist if you have questions. COMMON BRAND NAME(S): Prevnar, Prevnar 13 What should I tell my health care provider before I take this medicine? They need to know if you have any of these conditions: -bleeding problems -fever -immune system problems -an unusual or allergic reaction to pneumococcal vaccine, diphtheria toxoid, other vaccines, latex, other medicines, foods, dyes, or preservatives -pregnant or trying to get pregnant -breast-feeding How should I use this medicine? This vaccine is for injection into a muscle. It is given by a health care professional. A copy of Vaccine Information Statements will be given   before each vaccination. Read this sheet carefully each  time. The sheet may change frequently. Talk to your pediatrician regarding the use of this medicine in children. While this drug may be prescribed for children as young as 6 weeks old for selected conditions, precautions do apply. Overdosage: If you think you have taken too much of this medicine contact a poison control center or emergency room at once. NOTE: This medicine is only for you. Do not share this medicine with others. What if I miss a dose? It is important not to miss your dose. Call your doctor or health care professional if you are unable to keep an appointment. What may interact with this medicine? -medicines for cancer chemotherapy -medicines that suppress your immune function -steroid medicines like prednisone or cortisone This list may not describe all possible interactions. Give your health care provider a list of all the medicines, herbs, non-prescription drugs, or dietary supplements you use. Also tell them if you smoke, drink alcohol, or use illegal drugs. Some items may interact with your medicine. What should I watch for while using this medicine? Mild fever and pain should go away in 3 days or less. Report any unusual symptoms to your doctor or health care professional. What side effects may I notice from receiving this medicine? Side effects that you should report to your doctor or health care professional as soon as possible: -allergic reactions like skin rash, itching or hives, swelling of the face, lips, or tongue -breathing problems -confused -fast or irregular heartbeat -fever over 102 degrees F -seizures -unusual bleeding or bruising -unusual muscle weakness Side effects that usually do not require medical attention (report to your doctor or health care professional if they continue or are bothersome): -aches and pains -diarrhea -fever of 102 degrees F or less -headache -irritable -loss of appetite -pain, tender at site where injected -trouble  sleeping This list may not describe all possible side effects. Call your doctor for medical advice about side effects. You may report side effects to FDA at 1-800-FDA-1088. Where should I keep my medicine? This does not apply. This vaccine is given in a clinic, pharmacy, doctor's office, or other health care setting and will not be stored at home. NOTE: This sheet is a summary. It may not cover all possible information. If you have questions about this medicine, talk to your doctor, pharmacist, or health care provider.  2018 Elsevier/Gold Standard (2014-04-16 10:27:27)          

## 2017-08-15 NOTE — Telephone Encounter (Signed)
Scheduled appt per 1/23 los - Gave patient AVS and calender per los. - Central radiology to contact patient with ct scan .

## 2017-08-17 LAB — FISH,CLL PROGNOSTIC PANEL

## 2017-08-27 ENCOUNTER — Telehealth: Payer: Self-pay

## 2017-08-27 NOTE — Telephone Encounter (Signed)
Pt seen by Dr. Irene Limbo on 08/15/17. Requested, per daughter's recommendation, to have referral to Dr. Dorothea Ogle, Comern[facility]o specialist at [facility]. Called pt today to discuss intent of referral, and explained that it would be fine to get a second opinion. Based on pt message, explanation was that care would be shared between both Dr. Dorothea Ogle and Dr. Irene Limbo. Expressed that after second opinion, pt would either be seen primarily by Dr. Dorothea Ogle or by Dr. Irene Limbo. At this time, pt would not require collaboration between physicians during treatment, but is within her right to get a second opinion if she would like. Pt does not wish to have Dr. Dorothea Ogle as her primary oncologist/hematologist, but wants to see Dr. Irene Limbo because of familiarity and location. Pt stated, "If I decide at any point to get a second opinion, I will let your office know. At this time, I just want to proceed with Dr. Irene Limbo as planned."

## 2017-09-25 DIAGNOSIS — C9190 Lymphoid leukemia, unspecified not having achieved remission: Secondary | ICD-10-CM | POA: Diagnosis not present

## 2017-10-26 ENCOUNTER — Telehealth: Payer: Self-pay | Admitting: Medical Oncology

## 2017-10-26 NOTE — Telephone Encounter (Signed)
Information provided re barium contrast and when/where to pick it up.

## 2017-10-29 ENCOUNTER — Other Ambulatory Visit (INDEPENDENT_AMBULATORY_CARE_PROVIDER_SITE_OTHER): Payer: Medicare HMO

## 2017-10-29 DIAGNOSIS — E559 Vitamin D deficiency, unspecified: Secondary | ICD-10-CM | POA: Diagnosis not present

## 2017-10-29 DIAGNOSIS — E7800 Pure hypercholesterolemia, unspecified: Secondary | ICD-10-CM

## 2017-10-29 DIAGNOSIS — M810 Age-related osteoporosis without current pathological fracture: Secondary | ICD-10-CM

## 2017-10-29 DIAGNOSIS — E890 Postprocedural hypothyroidism: Secondary | ICD-10-CM | POA: Diagnosis not present

## 2017-10-29 LAB — BASIC METABOLIC PANEL
BUN: 23 mg/dL (ref 6–23)
CO2: 28 mEq/L (ref 19–32)
Calcium: 9.5 mg/dL (ref 8.4–10.5)
Chloride: 103 mEq/L (ref 96–112)
Creatinine, Ser: 0.9 mg/dL (ref 0.40–1.20)
GFR: 65.49 mL/min (ref >60.00)
Glucose, Bld: 81 mg/dL (ref 70–99)
Potassium: 4.4 mEq/L (ref 3.5–5.1)
Sodium: 136 mEq/L (ref 135–145)

## 2017-10-29 LAB — VITAMIN D 25 HYDROXY (VIT D DEFICIENCY, FRACTURES): VITD: 41.79 ng/mL (ref 30.00–100.00)

## 2017-10-30 ENCOUNTER — Other Ambulatory Visit: Payer: Medicare HMO

## 2017-11-01 ENCOUNTER — Ambulatory Visit: Payer: Medicare HMO | Admitting: Endocrinology

## 2017-11-01 ENCOUNTER — Encounter: Payer: Self-pay | Admitting: Endocrinology

## 2017-11-01 VITALS — BP 118/70 | HR 64 | Ht 62.25 in | Wt 113.8 lb

## 2017-11-01 DIAGNOSIS — M810 Age-related osteoporosis without current pathological fracture: Secondary | ICD-10-CM | POA: Diagnosis not present

## 2017-11-01 DIAGNOSIS — E559 Vitamin D deficiency, unspecified: Secondary | ICD-10-CM

## 2017-11-01 DIAGNOSIS — E890 Postprocedural hypothyroidism: Secondary | ICD-10-CM

## 2017-11-01 DIAGNOSIS — E7800 Pure hypercholesterolemia, unspecified: Secondary | ICD-10-CM | POA: Diagnosis not present

## 2017-11-01 LAB — LIPID PANEL
Cholesterol: 170 mg/dL (ref 0–200)
HDL: 67.8 mg/dL (ref >39.00)
LDL Cholesterol: 88 mg/dL (ref 0–99)
NonHDL: 101.8
Total CHOL/HDL Ratio: 3
Triglycerides: 67 mg/dL (ref 0.0–149.0)
VLDL: 13.4 mg/dL (ref 0.0–40.0)

## 2017-11-01 LAB — T4, FREE: Free T4: 0.6 ng/dL (ref 0.60–1.60)

## 2017-11-01 LAB — TSH: TSH: 3.76 u[IU]/mL (ref 0.35–4.50)

## 2017-11-01 MED ORDER — RISEDRONATE SODIUM 150 MG PO TABS: 150.0000 mg | ORAL_TABLET | ORAL | 3 refills | Status: DC

## 2017-11-01 NOTE — Progress Notes (Signed)
Patient ID: Samantha Graham, female   DOB: 1945/10/04, 72 y.o.   MRN: [id number]   Reason for Appointment:  Hypothyroidism, followup visit   History of Present Illness:   The hypothyroidism was first diagnosed in 1985  She has history of hyperthyroidism and thyroid nodule treated with thyroidectomy and ? Radioactive iodine but detailed records are not available of this According to her records since about 2001 had been taking 88 mcg brand name Synthroid  Since about 12/14 her TSH had been relatively low and her dose was reduced gradually to a final dose of 62.5 g She however was having complaints of extreme fatigue, brain fog and wanted to increase her dose to feel better  Because of her fatigue she was empirically started on Armour Thyroid in 04/2014 with 45 g. She did feel less fatigued with this along with less hair loss She has been taking the thyroid supplement very regularly in the morning before breakfast.  Since her TSH was low normal previously with the daily dose she was told to skip the dose on Sundays, she does continue to take this 6 days a week now  No unusual fatigue lately  Not taking any calcium or iron supplements with the thyroid supplement.  takes her calcium at night  Labs as follows, TSH consistently high normal in 1/19   Lab Results  Component Value Date   TSH 4.00 07/30/2017   TSH 2.50 10/31/2016   TSH 1.48 08/01/2016   FREET4 0.67 07/30/2017   FREET4 0.68 10/31/2016   FREET4 0.77 08/01/2016     OTHER active problems: See review of systems   Lab on 10/29/2017  Component Date Value Ref Range Status  . VITD 10/29/2017 41.79  30.00 - 100.00 ng/mL Final  . Sodium 10/29/2017 136  135 - 145 mEq/L Final  . Potassium 10/29/2017 4.4  3.5 - 5.1 mEq/L Final  . Chloride 10/29/2017 103  96 - 112 mEq/L Final  . CO2 10/29/2017 28  19 - 32 mEq/L Final  . Glucose, Bld 10/29/2017 81  70 - 99 mg/dL Final  . BUN 10/29/2017 23  6 - 23 mg/dL Final  .  Creatinine, Ser 10/29/2017 0.90  0.40 - 1.20 mg/dL Final  . Calcium 10/29/2017 9.5  8.4 - 10.5 mg/dL Final  . GFR 10/29/2017 65.49  >60.00 mL/min Final    Allergies as of 11/01/2017   No Known Allergies     Medication List        Accurate as of 11/01/17 11:11 AM. Always use your most recent med list.          ARMOUR THYROID 90 MG tablet Generic drug:  thyroid TAKE 1/2 A TABLET BY MOUTH DAILY   b complex-C-E-zinc tablet Take 1 tablet by mouth daily.   Fish Oil 1000 MG Caps Take by mouth.   rosuvastatin 10 MG tablet Commonly known as:  CRESTOR Take 10 mg by mouth daily.   Vitamin D3 5000 units Caps Take 5,000 Units by mouth once a week.       Allergies: No Known Allergies  Past Medical History:  Diagnosis Date  . Hyperlipidemia     Past Surgical History:  Procedure Laterality Date  . THYROIDECTOMY  1985    Family History  Problem Relation Age of Onset  . Thyroid disease Mother   . Cancer Father   . Diabetes Cousin     Social History:  reports that she has never smoked. She has never used smokeless tobacco.  She reports that she does not drink alcohol or use drugs.  REVIEW Of SYSTEMS:  No recent change in weight   Wt Readings from Last 3 Encounters:  11/01/17 113 lb 12.8 oz (51.6 kg)  08/15/17 111 lb 9.6 oz (50.6 kg)  08/02/17 112 lb 8 oz (51 kg)    Osteoporosis: Baseline bone density done by gynecologist showed T score was -3.0     Bone density on 08/08/16 shows T scores of -2.4 at spine (improved) and -2.2 at hip, stable  She does take calcium and vitamin D  She was concerned about lack of efficacy of Fosamax with her mother  Reclast was given in 5/16, she did have some nausea the next day and does not want to take it again  She had been taking Actonel since 09/2015 without side effects, this was then stopped in 2018 because of cost She was recommended Fosamax as an inexpensive option but she thinks it caused joint pains and stopped it in 6/18  without notifying S  Vitamin D supplements: Taking 5000 units  for deficiency; now weekly because of her high normal level on the last measurement  CLL: She is being followed for asymptomatic disease  Hypercholesterolemia: Apparently her LDL was 210 and she is being treated by PCP with Crestor 10 mg   Examination:   BP 118/70 (BP Location: Right Arm, Patient Position: Sitting, Cuff Size: Normal)   Pulse 64   Ht 5' 2.25" (1.581 m)   Wt 113 lb 12.8 oz (51.6 kg)   SpO2 96%   BMI 20.65 kg/m      Assessment /Plan  Hypothyroidism, long-standing, postsurgical  She is taking Armour Thyroid half of 90 mg 6 days a week   She is not reporting any unusual fatigue and her TSH was last normal even though it was 4.0 Considering her age and concomitant osteoporosis will not increase her dose as yet but she will have her PCP check a TSH in June and forward results to [place]   OSTEOPOROSIS: She has not been taking Actonel or Fosamax and is reluctant to take medications She does not want to take the Actonel unless it is less expensive than before Given her information on Prolia and she would look at this and call us Discussed that she needs to maintain the improvement in her bone density and reduce fracture risk with continued treatment  Vitamin D level is normal a with weekly 5000 units and she will continue the same  Follow-up in 6 months   There are no Patient Instructions on file for this visit.   Elayne Snare 11/01/2017, 11:11 AM

## 2017-11-02 ENCOUNTER — Telehealth: Payer: Self-pay | Admitting: Endocrinology

## 2017-11-02 NOTE — Telephone Encounter (Signed)
Patient stated she spoke with nurse earlier today about faxing LDL blood work results to [facility].  She states this needs to be sent to  [facility] ([place])   Fax [phone]

## 2017-11-05 ENCOUNTER — Telehealth: Payer: Self-pay | Admitting: Medical Oncology

## 2017-11-05 NOTE — Telephone Encounter (Signed)
Labs were faxed on 11/02/17 to [phone]

## 2017-11-05 NOTE — Telephone Encounter (Signed)
Contacted pt -no needs at this time.

## 2017-11-19 ENCOUNTER — Ambulatory Visit (HOSPITAL_COMMUNITY)
Admission: RE | Admit: 2017-11-19 | Discharge: 2017-11-19 | Disposition: A | Discharge status: Home / Self Care | Payer: Medicare HMO | Source: Ambulatory Visit | Attending: Hematology | Admitting: Hematology

## 2017-11-19 DIAGNOSIS — M11251 Other chondrocalcinosis, right hip: Secondary | ICD-10-CM | POA: Insufficient documentation

## 2017-11-19 DIAGNOSIS — C9190 Lymphoid leukemia, unspecified not having achieved remission: Secondary | ICD-10-CM | POA: Diagnosis present

## 2017-11-19 DIAGNOSIS — I2510 Atherosclerotic heart disease of native coronary artery without angina pectoris: Secondary | ICD-10-CM | POA: Diagnosis not present

## 2017-11-19 DIAGNOSIS — M47816 Spondylosis without myelopathy or radiculopathy, lumbar region: Secondary | ICD-10-CM | POA: Insufficient documentation

## 2017-11-19 DIAGNOSIS — M11252 Other chondrocalcinosis, left hip: Secondary | ICD-10-CM | POA: Insufficient documentation

## 2017-11-19 DIAGNOSIS — I700 Atherosclerosis of aorta: Secondary | ICD-10-CM | POA: Diagnosis not present

## 2017-11-19 DIAGNOSIS — M5136 Other intervertebral disc degeneration, lumbar region: Secondary | ICD-10-CM | POA: Insufficient documentation

## 2017-11-19 DIAGNOSIS — C9110 Chronic lymphocytic leukemia of B-cell type not having achieved remission: Secondary | ICD-10-CM | POA: Diagnosis not present

## 2017-12-12 NOTE — Progress Notes (Signed)
HEMATOLOGY/ONCOLOGY CLINIC NOTE  Date of Service: 12/13/17  Patient Care Team: Martinique, Betty G, MD as PCP - General (Family Medicine)  CHIEF COMPLAINTS/PURPOSE OF CONSULTATION:  F/u for recently diagnosed CLL  HISTORY OF PRESENTING ILLNESS:   Samantha Graham is a wonderful 72 y.o. female who has been referred to [place] by Dr. Martin[place], Betty G, MD for evaluation and management of incidentally noted leucocytosis/Lymphocytosis.  Patient is generally in good overall health and on labs done for her annual physical in 06/2017 on labs was incidentally noted to have leucocytosis of 21.7k with lymphocytosis of 17.7k. Nl hgb of 13.8 and nl platelet count of 200k. She notes no new fatigue, fevers/chills/night sweats, overt LN enlargement, abdominal pain or distension. No recent viral infection. It is unclear if she has been noted to have lymphocytosis prior to these labs (old labs not available to [place] currently).  She was referred to [place] for further evaluation of her lymphocytosis.  She notes she feels no differently than has has felt over the last few years.  Patient notes that she is a non smoker. No other recent chemical exposures. No PRBC transfusions in the past.  Interval History:  Samantha Graham is a wonderful 72 y.o. female who is following up today regarding her  Chronic Lymphocytic Leukemia. The patient's last visit with [place] was on 08/15/17. She is accompanied today by her husband. The pt reports that she is doing well overall.   The pt reports no new concerns and is keeping active. She denies any constitutional symptoms.   Of note since the patient's last visit, pt has had CT C/A/P completed on 11/19/17 with results revealing 1. No overtly pathologic adenopathy is identified. One upper normal sized lymph node is a 9 mm in short axis left axillary lymph node. 2. There is some scarring with associated nodularity anteriorly in the left lower lobe. This is near the pleural surface and  has some internal adipose tissue, and accordingly is likely incidental. There is some additional pleural plaques are posterior pleural thickening along the left posterior costophrenic angle which may warrant periodic surveillance. 3. Other imaging findings of potential clinical significance: Aortic Atherosclerosis (ICD10-I70.0). Coronary atherosclerosis. Prominent stool throughout the colon favors constipation. Lower lumbar spondylosis and degenerative disc disease. Chondrocalcinosis, query CPPD arthropathy.  Lab results today (12/13/17) of CBC, CMP, and Reticulocytes is as follows: all values are WNL except for WBC at 25.7k, Lymphs abs at 20.9k, Creatinine at 1.35. LDH 12/13/17 is WNL at 141.  On review of systems, pt reports keeping active, and denies fevers, chills, night sweats, unexpected weight loss, concerns for infections, fatigue, change in energy levels, concerns for bleeding, noticing any lumps or bumps, and any other symptoms.    MEDICAL HISTORY:  Past Medical History:  Diagnosis Date  . Hyperlipidemia   Hypothyroidism  Rt frozen shoulder Osteopenia/osteoporosis  SURGICAL HISTORY: Past Surgical History:  Procedure Laterality Date  . THYROIDECTOMY  1985  hysterectomy with BSO due to prolpased uterus Bunionectomy Meniscal injury -rt knee Bladder cyst removal  SOCIAL HISTORY: Social History   Socioeconomic History  . Marital status: Married    Spouse name: Not on file  . Number of children: Not on file  . Years of education: Not on file  . Highest education level: Not on file  Occupational History  . Not on file  Social Needs  . Financial resource strain: Not on file  . Food insecurity:    Worry: Not on file  Inability: Not on file  . Transportation needs:    Medical: Not on file    Non-medical: Not on file  Tobacco Use  . Smoking status: Never Smoker  . Smokeless tobacco: Never Used  Substance and Sexual Activity  . Alcohol use: No    Frequency: Never    . Drug use: No  . Sexual activity: Not on file  Lifestyle  . Physical activity:    Days per week: Not on file    Minutes per session: Not on file  . Stress: Not on file  Relationships  . Social connections:    Talks on phone: Not on file    Gets together: Not on file    Attends religious service: Not on file    Active member of club or organization: Not on file    Attends meetings of clubs or organizations: Not on file    Relationship status: Not on file  . Intimate partner violence:    Fear of current or ex partner: Not on file    Emotionally abused: Not on file    Physically abused: Not on file    Forced sexual activity: Not on file  Other Topics Concern  . Not on file  Social History Narrative  . Not on file    FAMILY HISTORY: Family History  Problem Relation Age of Onset  . Thyroid disease Mother   . Cancer Father   . Diabetes Cousin     ALLERGIES:  has No Known Allergies.  MEDICATIONS:  Current Outpatient Medications  Medication Sig Dispense Refill  . ARMOUR THYROID 90 MG tablet TAKE 1/2 A TABLET BY MOUTH DAILY (Patient taking differently: TAKE 1/2 A TABLET BY MOUTH DAILY, not on sunday) 15 tablet 3  . B Complex-C-E-Zn (B COMPLEX-C-E-ZINC) tablet Take 1 tablet by mouth daily.    . Cholecalciferol (VITAMIN D3) 5000 UNITS CAPS Take 5,000 Units by mouth once a week.     . NON FORMULARY Take 1 capsule by mouth daily.     . NON FORMULARY Take 1 Dose by mouth daily.     . Omega-3 Fatty Acids (FISH OIL) 1000 MG CAPS Take by mouth.    . risedronate (ACTONEL) 150 MG tablet Take 1 tablet (150 mg total) by mouth every 30 (thirty) days. with water on empty stomach, nothing by mouth or lie down for next 30 minutes. 1 tablet 3  . rosuvastatin (CRESTOR) 10 MG tablet Take 10 mg by mouth daily.    . meloxicam (MOBIC) 15 MG tablet Take 1 tablet by mouth daily.     No current facility-administered medications for this visit.     REVIEW OF SYSTEMS:    A 10+ POINT REVIEW OF  SYSTEMS WAS OBTAINED including neurology, dermatology, psychiatry, cardiac, respiratory, lymph, extremities, GI, GU, Musculoskeletal, constitutional, breasts, reproductive, HEENT.  All pertinent positives are noted in the HPI.  All others are negative.    PHYSICAL EXAMINATION: ECOG PERFORMANCE STATUS: 0 - Asymptomatic  Vitals:   12/13/17 1420  BP: (!) 94/56  Pulse: 61  Resp: 18  Temp: 97.6 F (36.4 C)  SpO2: 99%   Filed Weights   12/13/17 1420  Weight: 115 lb 3.2 oz (52.3 kg)   .Body mass index is 20.9 kg/m. NAD  GENERAL:alert, in no acute distress and comfortable SKIN: no acute rashes, no significant lesions EYES: conjunctiva are pink and non-injected, sclera anicteric OROPHARYNX: MMM, no exudates, no oropharyngeal erythema or ulceration NECK: supple, no JVD LYMPH:  no palpable lymphadenopathy  in the cervical, axillary or inguinal regions LUNGS: clear to auscultation b/l with normal respiratory effort HEART: regular rate & rhythm ABDOMEN:  normoactive bowel sounds , non tender, not distended. No palpable hepatosplenomegaly.  Extremity: no pedal edema PSYCH: alert & oriented x 3 with fluent speech NEURO: no focal motor/sensory deficits   LABORATORY DATA:  I have reviewed the data as listed  . CBC Latest Ref Rng & Units 12/13/2017 08/03/2017  WBC 3.9 - 10.3 K/uL 25.7(H) 22.4(H)  Hemoglobin 11.6 - 15.9 g/dL 13.7 13.4  Hematocrit 34.8 - 46.6 % 41.3 41.0  Platelets 145 - 400 K/uL 197 217   . CBC    Component Value Date/Time   WBC 25.7 (H) 12/13/2017 1336   WBC 22.4 (H) 08/03/2017 1055   RBC 4.48 12/13/2017 1336   RBC 4.48 12/13/2017 1336   HGB 13.7 12/13/2017 1336   HCT 41.3 12/13/2017 1336   PLT 197 12/13/2017 1336   MCV 92.2 12/13/2017 1336   MCH 30.6 12/13/2017 1336   MCHC 33.2 12/13/2017 1336   RDW 13.9 12/13/2017 1336   LYMPHSABS 20.9 (H) 12/13/2017 1336   MONOABS 0.7 12/13/2017 1336   EOSABS 0.2 12/13/2017 1336   BASOSABS 0.1 12/13/2017 1336      . CMP Latest Ref Rng & Units 12/13/2017 10/29/2017 08/03/2017  Glucose 70 - 140 mg/dL 95 81 81  BUN 7 - 26 mg/dL 25 23 20   Creatinine 0.60 - 1.10 mg/dL 1.35(H) 0.90 1.00  Sodium 136 - 145 mmol/L 138 136 140  Potassium 3.5 - 5.1 mmol/L 4.3 4.4 4.3  Chloride 98 - 109 mmol/L 103 103 104  CO2 22 - 29 mmol/L 26 28 26   Calcium 8.4 - 10.4 mg/dL 9.6 9.5 9.6  Total Protein 6.4 - 8.3 g/dL 7.2 - 7.2  Total Bilirubin 0.2 - 1.2 mg/dL 0.4 - 0.4  Alkaline Phos 40 - 150 U/L 42 - 41  AST 5 - 34 U/L 24 - 23  ALT 0 - 55 U/L 18 - 18     Component     Latest Ref Rng & Units 08/03/2017  Kappa free light chain     3.3 - 19.4 mg/L 15.0  Lamda free light chains     5.7 - 26.3 mg/L 13.0  Kappa, lamda light chain ratio     0.26 - 1.65 1.15  LDH     125 - 245 U/L 173       FISH:     RADIOGRAPHIC STUDIES:  I have personally reviewed the radiological images as listed and agreed with the findings in the report. Ct Abdomen Pelvis Wo Contrast  Result Date: 11/19/2017 CLINICAL DATA:  Chronic lymphocytic leukemia staging workup EXAM: CT CHEST, ABDOMEN AND PELVIS WITHOUT CONTRAST TECHNIQUE: Multidetector CT imaging of the chest, abdomen and pelvis was performed following the standard protocol without IV contrast. COMPARISON:  None. FINDINGS: CT CHEST FINDINGS Cardiovascular: Mild atherosclerotic calcification of the aortic arch and left main coronary artery. Mediastinum/Nodes: Scattered small bilateral axillary lymph nodes, 1 of the most prominent is a 9 mm in short axis left axillary lymph node on image 12/2. Very small subpectoral lymph nodes. Lungs/Pleura: Biapical pleuroparenchymal scarring. Minimal scarring or subsegmental atelectasis in the lingula and anteriorly in the left lower lobe. Pleural-based densities along the left posterior costophrenic angle measuring up to about 6 mm in thickness. Slight nodularity along the anterior left lower lobe scarring with very low-density including some fat  density on image 117/4, likely benign. Musculoskeletal: Bilateral glenohumeral joint  chondrocalcinosis potentially with calcific tendinopathy of the supraspinatus musculature bilaterally. CT ABDOMEN PELVIS FINDINGS Hepatobiliary: Several tiny hypodense lesions in the liver are technically nonspecific but statistically likely to be benign. These include a 5 mm hypodense lesion in segment 4a of the liver on image 51/5. Punctate calcification of the liver capsule adjacent to Morrison's pouch, likely incidental/postinflammatory. Pancreas: Unremarkable Spleen: Unremarkable.  No splenomegaly. Adrenals/Urinary Tract: Unremarkable Stomach/Bowel: Prominent stool throughout the colon favors constipation. Vascular/Lymphatic: Aortoiliac atherosclerotic vascular disease. No appreciable pathologically enlarged adenopathy in the abdomen/pelvis. Reproductive: Hysterectomy.  Adnexa unremarkable. Other: No supplemental non-categorized findings. Musculoskeletal: Lower lumbar spondylosis and degenerative disc disease. Chondrocalcinosis in the hips with associated spurring and moderate loss of articular space. IMPRESSION: 1. No overtly pathologic adenopathy is identified. One upper normal sized lymph node is a 9 mm in short axis left axillary lymph node. 2. There is some scarring with associated nodularity anteriorly in the left lower lobe. This is near the pleural surface and has some internal adipose tissue, and accordingly is likely incidental. There is some additional pleural plaques are posterior pleural thickening along the left posterior costophrenic angle which may warrant periodic surveillance. 3. Other imaging findings of potential clinical significance: Aortic Atherosclerosis (ICD10-I70.0). Coronary atherosclerosis. Prominent stool throughout the colon favors constipation. Lower lumbar spondylosis and degenerative disc disease. Chondrocalcinosis, query CPPD arthropathy. Electronically Signed   By: Van Clines M.D.    On: 11/19/2017 11:22   Ct Chest Wo Contrast  Result Date: 11/19/2017 CLINICAL DATA:  Chronic lymphocytic leukemia staging workup EXAM: CT CHEST, ABDOMEN AND PELVIS WITHOUT CONTRAST TECHNIQUE: Multidetector CT imaging of the chest, abdomen and pelvis was performed following the standard protocol without IV contrast. COMPARISON:  None. FINDINGS: CT CHEST FINDINGS Cardiovascular: Mild atherosclerotic calcification of the aortic arch and left main coronary artery. Mediastinum/Nodes: Scattered small bilateral axillary lymph nodes, 1 of the most prominent is a 9 mm in short axis left axillary lymph node on image 12/2. Very small subpectoral lymph nodes. Lungs/Pleura: Biapical pleuroparenchymal scarring. Minimal scarring or subsegmental atelectasis in the lingula and anteriorly in the left lower lobe. Pleural-based densities along the left posterior costophrenic angle measuring up to about 6 mm in thickness. Slight nodularity along the anterior left lower lobe scarring with very low-density including some fat density on image 117/4, likely benign. Musculoskeletal: Bilateral glenohumeral joint chondrocalcinosis potentially with calcific tendinopathy of the supraspinatus musculature bilaterally. CT ABDOMEN PELVIS FINDINGS Hepatobiliary: Several tiny hypodense lesions in the liver are technically nonspecific but statistically likely to be benign. These include a 5 mm hypodense lesion in segment 4a of the liver on image 51/5. Punctate calcification of the liver capsule adjacent to Morrison's pouch, likely incidental/postinflammatory. Pancreas: Unremarkable Spleen: Unremarkable.  No splenomegaly. Adrenals/Urinary Tract: Unremarkable Stomach/Bowel: Prominent stool throughout the colon favors constipation. Vascular/Lymphatic: Aortoiliac atherosclerotic vascular disease. No appreciable pathologically enlarged adenopathy in the abdomen/pelvis. Reproductive: Hysterectomy.  Adnexa unremarkable. Other: No supplemental  non-categorized findings. Musculoskeletal: Lower lumbar spondylosis and degenerative disc disease. Chondrocalcinosis in the hips with associated spurring and moderate loss of articular space. IMPRESSION: 1. No overtly pathologic adenopathy is identified. One upper normal sized lymph node is a 9 mm in short axis left axillary lymph node. 2. There is some scarring with associated nodularity anteriorly in the left lower lobe. This is near the pleural surface and has some internal adipose tissue, and accordingly is likely incidental. There is some additional pleural plaques are posterior pleural thickening along the left posterior costophrenic angle which may warrant periodic surveillance. 3.  Other imaging findings of potential clinical significance: Aortic Atherosclerosis (ICD10-I70.0). Coronary atherosclerosis. Prominent stool throughout the colon favors constipation. Lower lumbar spondylosis and degenerative disc disease. Chondrocalcinosis, query CPPD arthropathy. Electronically Signed   By: Van Clines M.D.   On: 11/19/2017 11:22    ASSESSMENT & PLAN:   72 yo female with   1)  Recently  diagnosed Chronic Lymphocytic Leukemia -likely Rai Stage 0 with monoalleilic 37S deletion No anemia/thrombocytopenia/clinicallyovert splenomegaly or LNadenoapathy. No constitutional symptoms. Newly noted - incidentally on annual labs. No older labs available for reference. . Lab Results  Component Value Date   LDH 141 12/13/2017   Plan Discussed pt labwork today, 12/13/17; some increase in WBC to 25.7, and Lymphs Abs at 20.9k -FISH Prognostic panel revealed a monoallelic 28B deletion which might suggest a more indolent course for her CLL -Reviewed 11/19/17 CT C/A/P which revealed no changes in spleen size and no overtly pathologic adenopathy is identified. One upper normal sized lymph node is a 9 mm in short axis left axillary lymph node.  -Pneumovax today 12/13/17 -reminded  Patient of symptoms to monitor  for that could suggest disease progression requiring treatment.  .3) . Patient Active Problem List   Diagnosis Date Noted  . Age related osteoporosis 08/04/2016  . 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   Pneumovax today RTC with Dr Irene Limbo in 6 months with labs   All of the patients questions were answered with apparent satisfaction. The patient knows to call the clinic with any problems, questions or concerns.  . The total time spent in the appointment was 20 minutes and more than 50% was on counseling and direct patient cares.      Sullivan Lone MD [place] AAHIVMS [facility] Hematology/Oncology Physician [facility]  (Office):       [phone] (Work cell):  [phone] (Fax):           [phone]  12/13/2017 2:51 PM  This document serves as a record of services personally performed by Sullivan Lone, MD. It was created on his behalf by Baldwin Jamaica, a trained medical scribe. The creation of this record is based on the scribe's personal observations and the provider's statements to them.   .I have reviewed the above documentation for accuracy and completeness, and I agree with the above. Brunetta Genera MD MS

## 2017-12-13 ENCOUNTER — Encounter: Payer: Self-pay | Admitting: Hematology

## 2017-12-13 ENCOUNTER — Inpatient Hospital Stay: Payer: Medicare HMO

## 2017-12-13 ENCOUNTER — Inpatient Hospital Stay: Payer: Medicare HMO | Attending: Hematology | Admitting: Hematology

## 2017-12-13 ENCOUNTER — Other Ambulatory Visit: Payer: Self-pay

## 2017-12-13 ENCOUNTER — Telehealth: Payer: Self-pay | Admitting: Hematology

## 2017-12-13 VITALS — BP 94/56 | HR 61 | Temp 97.6°F | Resp 18 | Ht 62.25 in | Wt 115.2 lb

## 2017-12-13 DIAGNOSIS — Z23 Encounter for immunization: Secondary | ICD-10-CM | POA: Insufficient documentation

## 2017-12-13 DIAGNOSIS — C9110 Chronic lymphocytic leukemia of B-cell type not having achieved remission: Secondary | ICD-10-CM | POA: Diagnosis not present

## 2017-12-13 LAB — CMP (CANCER CENTER ONLY)
ALT: 18 U/L (ref 0–55)
AST: 24 U/L (ref 5–34)
Albumin: 4.1 g/dL (ref 3.5–5.0)
Alkaline Phosphatase: 42 U/L (ref 40–150)
Anion gap: 9 (ref 3–11)
BUN: 25 mg/dL (ref 7–26)
CO2: 26 mmol/L (ref 22–29)
Calcium: 9.6 mg/dL (ref 8.4–10.4)
Chloride: 103 mmol/L (ref 98–109)
Creatinine: 1.35 mg/dL — ABNORMAL HIGH (ref 0.60–1.10)
GFR, Est AFR Am: 45 mL/min — ABNORMAL LOW (ref >60)
GFR, Estimated: 38 mL/min — ABNORMAL LOW (ref >60)
Glucose, Bld: 95 mg/dL (ref 70–140)
Potassium: 4.3 mmol/L (ref 3.5–5.1)
Sodium: 138 mmol/L (ref 136–145)
Total Bilirubin: 0.4 mg/dL (ref 0.2–1.2)
Total Protein: 7.2 g/dL (ref 6.4–8.3)

## 2017-12-13 LAB — CBC WITH DIFFERENTIAL (CANCER CENTER ONLY)
Basophils Absolute: 0.1 10*3/uL (ref 0.0–0.1)
Basophils Relative: 0 %
Eosinophils Absolute: 0.2 10*3/uL (ref 0.0–0.5)
Eosinophils Relative: 1 %
HCT: 41.3 % (ref 34.8–46.6)
Hemoglobin: 13.7 g/dL (ref 11.6–15.9)
Lymphocytes Relative: 81 %
Lymphs Abs: 20.9 10*3/uL — ABNORMAL HIGH (ref 0.9–3.3)
MCH: 30.6 pg (ref 25.1–34.0)
MCHC: 33.2 g/dL (ref 31.5–36.0)
MCV: 92.2 fL (ref 79.5–101.0)
Monocytes Absolute: 0.7 10*3/uL (ref 0.1–0.9)
Monocytes Relative: 3 %
Neutro Abs: 3.9 10*3/uL (ref 1.5–6.5)
Neutrophils Relative %: 15 %
Platelet Count: 197 10*3/uL (ref 145–400)
RBC: 4.48 MIL/uL (ref 3.70–5.45)
RDW: 13.9 % (ref 11.2–14.5)
WBC Count: 25.7 10*3/uL — ABNORMAL HIGH (ref 3.9–10.3)

## 2017-12-13 LAB — LACTATE DEHYDROGENASE: LDH: 141 U/L (ref 125–245)

## 2017-12-13 LAB — RETICULOCYTES
RBC.: 4.48 MIL/uL (ref 3.70–5.45)
Retic Count, Absolute: 49.3 10*3/uL (ref 33.7–90.7)
Retic Ct Pct: 1.1 % (ref 0.7–2.1)

## 2017-12-13 MED ORDER — PNEUMOCOCCAL VAC POLYVALENT 25 MCG/0.5ML IJ INJ: 0.5000 mL | INJECTION | Freq: Once | INTRAMUSCULAR | Status: DC

## 2017-12-13 MED ORDER — PNEUMOCOCCAL VAC POLYVALENT 25 MCG/0.5ML IJ INJ
0.5000 mL | INJECTION | Freq: Once | INTRAMUSCULAR | Status: AC
  Administered 2017-12-13: 0.5 mL via INTRAMUSCULAR
  Filled 2017-12-13: qty 0.5

## 2017-12-13 NOTE — Telephone Encounter (Signed)
Appointments scheduled AVS/Calendar printed per 5/23 los °

## 2017-12-21 DIAGNOSIS — R109 Unspecified abdominal pain: Secondary | ICD-10-CM | POA: Diagnosis not present

## 2018-01-10 ENCOUNTER — Other Ambulatory Visit: Payer: Self-pay | Admitting: Endocrinology

## 2018-01-29 DIAGNOSIS — R69 Illness, unspecified: Secondary | ICD-10-CM | POA: Diagnosis not present

## 2018-03-10 DIAGNOSIS — R079 Chest pain, unspecified: Secondary | ICD-10-CM | POA: Diagnosis not present

## 2018-03-10 DIAGNOSIS — R9431 Abnormal electrocardiogram [ECG] [EKG]: Secondary | ICD-10-CM | POA: Diagnosis not present

## 2018-03-10 DIAGNOSIS — M542 Cervicalgia: Secondary | ICD-10-CM | POA: Diagnosis not present

## 2018-03-10 DIAGNOSIS — R001 Bradycardia, unspecified: Secondary | ICD-10-CM | POA: Diagnosis not present

## 2018-03-10 DIAGNOSIS — M62838 Other muscle spasm: Secondary | ICD-10-CM | POA: Diagnosis not present

## 2018-03-10 DIAGNOSIS — J42 Unspecified chronic bronchitis: Secondary | ICD-10-CM | POA: Diagnosis not present

## 2018-03-22 ENCOUNTER — Other Ambulatory Visit: Payer: Self-pay | Admitting: Endocrinology

## 2018-03-22 DIAGNOSIS — H16292 Other keratoconjunctivitis, left eye: Secondary | ICD-10-CM | POA: Diagnosis not present

## 2018-03-26 DIAGNOSIS — C9190 Lymphoid leukemia, unspecified not having achieved remission: Secondary | ICD-10-CM | POA: Diagnosis not present

## 2018-04-29 ENCOUNTER — Other Ambulatory Visit (INDEPENDENT_AMBULATORY_CARE_PROVIDER_SITE_OTHER): Payer: Medicare HMO

## 2018-04-29 DIAGNOSIS — M810 Age-related osteoporosis without current pathological fracture: Secondary | ICD-10-CM

## 2018-04-29 LAB — BASIC METABOLIC PANEL
BUN: 19 mg/dL (ref 6–23)
CO2: 30 mEq/L (ref 19–32)
Calcium: 9.8 mg/dL (ref 8.4–10.5)
Chloride: 103 mEq/L (ref 96–112)
Creatinine, Ser: 0.91 mg/dL (ref 0.40–1.20)
GFR: 64.57 mL/min (ref >60.00)
Glucose, Bld: 89 mg/dL (ref 70–99)
Potassium: 4.4 mEq/L (ref 3.5–5.1)
Sodium: 137 mEq/L (ref 135–145)

## 2018-05-01 ENCOUNTER — Ambulatory Visit: Payer: Medicare HMO | Admitting: Psychology

## 2018-05-02 ENCOUNTER — Ambulatory Visit: Payer: Medicare HMO | Admitting: Endocrinology

## 2018-05-02 ENCOUNTER — Encounter: Payer: Self-pay | Admitting: Endocrinology

## 2018-05-02 VITALS — BP 108/68 | HR 72 | Ht 62.0 in | Wt 113.0 lb

## 2018-05-02 DIAGNOSIS — E890 Postprocedural hypothyroidism: Secondary | ICD-10-CM

## 2018-05-02 DIAGNOSIS — E559 Vitamin D deficiency, unspecified: Secondary | ICD-10-CM

## 2018-05-02 DIAGNOSIS — E7800 Pure hypercholesterolemia, unspecified: Secondary | ICD-10-CM | POA: Insufficient documentation

## 2018-05-02 DIAGNOSIS — M810 Age-related osteoporosis without current pathological fracture: Secondary | ICD-10-CM

## 2018-05-02 LAB — LIPID PANEL
Cholesterol: 168 mg/dL (ref 0–200)
HDL: 64.9 mg/dL (ref >39.00)
LDL Cholesterol: 88 mg/dL (ref 0–99)
NonHDL: 102.86
Total CHOL/HDL Ratio: 3
Triglycerides: 73 mg/dL (ref 0.0–149.0)
VLDL: 14.6 mg/dL (ref 0.0–40.0)

## 2018-05-02 LAB — T4, FREE: Free T4: 0.71 ng/dL (ref 0.60–1.60)

## 2018-05-02 LAB — TSH: TSH: 1.85 u[IU]/mL (ref 0.35–4.50)

## 2018-05-02 LAB — T3, FREE: T3, Free: 3 pg/mL (ref 2.3–4.2)

## 2018-05-02 NOTE — Progress Notes (Signed)
Please call to let patient know that the weight and cholesterol results are normal and no further action needed

## 2018-05-02 NOTE — Progress Notes (Signed)
Patient ID: Samantha Graham, female   DOB: Dec 21, 1945, 72 y.o.   MRN: [id number]   Reason for Appointment:  Hypothyroidism, followup visit   History of Present Illness:   The hypothyroidism was first diagnosed in 1985  She has history of hyperthyroidism and thyroid nodule treated with thyroidectomy and ? Radioactive iodine but detailed records are not available of this According to her records since about 2001 had been taking 88 mcg brand name Synthroid  Since about 12/14 her TSH had been relatively low and her dose was reduced gradually to a final dose of 62.5 g She however was having complaints of extreme fatigue, brain fog and wanted to increase her dose to feel better  Because of her fatigue she was empirically started on Armour Thyroid in 04/2014 with 45 g. She did feel less fatigued with this along with less hair loss initially She has been taking the thyroid supplement very regularly in the morning before breakfast.   Since her TSH was low normal previously with the daily dose she was told to skip the dose on Sundays  No new symptoms of fatigue lately, may get tired at times  Not taking any calcium or iron supplements with the thyroid supplement.  takes her calcium at night  Labs as follows, TSH high normal in 4/19 No changes were made  Lab Results  Component Value Date   TSH 3.76 11/01/2017   TSH 4.00 07/30/2017   TSH 2.50 10/31/2016   FREET4 0.60 11/01/2017   FREET4 0.67 07/30/2017   FREET4 0.68 10/31/2016     OTHER active problems: See review of systems   Lab on 04/29/2018  Component Date Value Ref Range Status  . Sodium 04/29/2018 137  135 - 145 mEq/L Final  . Potassium 04/29/2018 4.4  3.5 - 5.1 mEq/L Final  . Chloride 04/29/2018 103  96 - 112 mEq/L Final  . CO2 04/29/2018 30  19 - 32 mEq/L Final  . Glucose, Bld 04/29/2018 89  70 - 99 mg/dL Final  . BUN 04/29/2018 19  6 - 23 mg/dL Final  . Creatinine, Ser 04/29/2018 0.91  0.40 - 1.20 mg/dL  Final  . Calcium 04/29/2018 9.8  8.4 - 10.5 mg/dL Final  . GFR 04/29/2018 64.57  >60.00 mL/min Final    Allergies as of 05/02/2018   No Known Allergies     Medication List        Accurate as of 05/02/18 10:39 AM. Always use your most recent med list.          ARMOUR THYROID 90 MG tablet Generic drug:  thyroid TAKE 1/2 A TABLET BY MOUTH DAILY   thyroid 90 MG tablet Commonly known as:  ARMOUR TAKE 1/2 TABLET BY MOUTH DAILY   b complex-C-E-zinc tablet Take 1 tablet by mouth daily.   Fish Oil 1000 MG Caps Take by mouth.   meloxicam 15 MG tablet Commonly known as:  MOBIC Take 1 tablet by mouth daily.   NON FORMULARY Take 1 capsule by mouth daily.   NON FORMULARY Take 1 Dose by mouth daily.   risedronate 150 MG tablet Commonly known as:  ACTONEL PLEASE SEE ATTACHED FOR DETAILED DIRECTIONS   rosuvastatin 10 MG tablet Commonly known as:  CRESTOR Take 10 mg by mouth daily.   Vitamin D3 5000 units Caps Take 5,000 Units by mouth once a week.       Allergies: No Known Allergies  Past Medical History:  Diagnosis Date  . Hyperlipidemia  Past Surgical History:  Procedure Laterality Date  . THYROIDECTOMY  1985    Family History  Problem Relation Age of Onset  . Thyroid disease Mother   . Cancer Father   . Diabetes Cousin     Social History:  reports that she has never smoked. She has never used smokeless tobacco. She reports that she does not drink alcohol or use drugs.  REVIEW Of SYSTEMS:  No recent change in weight   Wt Readings from Last 3 Encounters:  05/02/18 113 lb (51.3 kg)  12/13/17 115 lb 3.2 oz (52.3 kg)  11/01/17 113 lb 12.8 oz (51.6 kg)    Osteoporosis: Baseline bone density done by gynecologist showed T score was -3.0     Bone density on 08/08/16 shows T scores of -2.4 at spine (improved) and -2.2 at hip, stable  She does take calcium and vitamin D  She was concerned about lack of efficacy of Fosamax with her mother but is  taking ACTONEL now, previously had difficulties affording this Also she thinks that both Fosamax and Actonel caused joint pains She thinks Actonel causes joint pains 2 weeks after the dose Also concerned about possible hair loss as a side effect  Reclast was given in 5/16, she did have some nausea the next day and does not want to take it again She was given information on Prolia on the last visit but she does not want to take this and wants to stop all medications  Vitamin D supplements: Taking 5000 units  for deficiency; now weekly because of her high normal level on the last measurement  CLL: She is being followed for asymptomatic disease  Hypercholesterolemia: Apparently her baseline LDL was 210 and she is being treated by PCP with Crestor 10 mg   Lab Results  Component Value Date   CHOL 170 11/01/2017   HDL 67.80 11/01/2017   LDLCALC 88 11/01/2017   TRIG 67.0 11/01/2017   CHOLHDL 3 11/01/2017      Examination:   BP 108/68   Pulse 72   Ht 5\' 2"  (1.575 m)   Wt 113 lb (51.3 kg)   SpO2 97%   BMI 20.67 kg/m      Assessment /Plan  Hypothyroidism, long-standing, postsurgical   She is taking Armour Thyroid half of 90 mg 6 days a week   Will have thyroid levels checked again today She is overall feeling fairly good   OSTEOPOROSIS: She has now been taking Actonel However she thinks it causes joint pains 2 weeks after she takes this She is refusing to consider Prolia or Reclast again However her bone density did improve last year with treatment, baseline T score -3.0  Vitamin D level to be checked annually  LIPIDS: To have lipids checked again today  Follow-up in 6 months   There are no Patient Instructions on file for this visit.   Elayne Snare 05/02/2018, 10:39 AM

## 2018-05-03 ENCOUNTER — Encounter: Payer: Self-pay | Admitting: Endocrinology

## 2018-05-08 ENCOUNTER — Telehealth: Payer: Self-pay | Admitting: Endocrinology

## 2018-05-08 NOTE — Telephone Encounter (Signed)
Patient states they had to retake a lab test. She would like to know if she can know about what to do with her medication.  Ph # [phone]

## 2018-05-09 ENCOUNTER — Telehealth: Payer: Self-pay | Admitting: Endocrinology

## 2018-05-09 NOTE — Telephone Encounter (Signed)
Let pt know of note below.

## 2018-05-09 NOTE — Telephone Encounter (Signed)
Patient received blood work in the mail and would like a further explanation on the results. Please Advise.  Ph # [phone]

## 2018-05-09 NOTE — Telephone Encounter (Signed)
Her thyroid and cholesterol results were in normal range and no changes needed

## 2018-06-05 DIAGNOSIS — R69 Illness, unspecified: Secondary | ICD-10-CM | POA: Diagnosis not present

## 2018-06-12 ENCOUNTER — Telehealth: Payer: Self-pay | Admitting: Hematology

## 2018-06-12 NOTE — Telephone Encounter (Signed)
Patient called to reschedule  °

## 2018-06-14 ENCOUNTER — Other Ambulatory Visit: Payer: Medicare HMO

## 2018-06-14 ENCOUNTER — Ambulatory Visit: Payer: Medicare HMO | Admitting: Hematology

## 2018-06-26 DIAGNOSIS — Z0100 Encounter for examination of eyes and vision without abnormal findings: Secondary | ICD-10-CM | POA: Diagnosis not present

## 2018-06-26 DIAGNOSIS — H40053 Ocular hypertension, bilateral: Secondary | ICD-10-CM | POA: Diagnosis not present

## 2018-06-26 DIAGNOSIS — H524 Presbyopia: Secondary | ICD-10-CM | POA: Diagnosis not present

## 2018-07-02 ENCOUNTER — Other Ambulatory Visit: Payer: Self-pay | Admitting: Endocrinology

## 2018-07-10 NOTE — Progress Notes (Signed)
HEMATOLOGY/ONCOLOGY CLINIC NOTE  Date of Service: 07/11/18    Patient Care Team: Martinique, Betty G, MD as PCP - General (Family Medicine)  CHIEF COMPLAINTS/PURPOSE OF CONSULTATION:  F/u for recently diagnosed CLL  HISTORY OF PRESENTING ILLNESS:   Samantha Graham is a wonderful 72 y.o. female who has been referred to [place] by Dr. Martin[place], Betty G, MD for evaluation and management of incidentally noted leucocytosis/Lymphocytosis.  Patient is generally in good overall health and on labs done for her annual physical in 06/2017 on labs was incidentally noted to have leucocytosis of 21.7k with lymphocytosis of 17.7k. Nl hgb of 13.8 and nl platelet count of 200k. She notes no new fatigue, fevers/chills/night sweats, overt LN enlargement, abdominal pain or distension. No recent viral infection. It is unclear if she has been noted to have lymphocytosis prior to these labs (old labs not available to [place] currently).  She was referred to [place] for further evaluation of her lymphocytosis.  She notes she feels no differently than has has felt over the last few years.  Patient notes that she is a non smoker. No other recent chemical exposures. No PRBC transfusions in the past.  Interval History:  Samantha Graham is a wonderful 72 y.o. female who is following up today regarding her Chronic Lymphocytic Leukemia. The patient's last visit with [place] was on 12/13/17. She is accompanied today by her husband. The pt reports that she is doing well overall.   The pt reports that she has had some joint issues that began with the cold weather, which she has chosen to address with yoga. She notes that her energy levels have been stable and denies developing any fevers, chills, night sweats, and concerns for infections. She notes that she has been doing her best to eat healthy. She denies noticing any new lumps or bumps.   Lab results today (07/11/18) of CBC w/diff and CMP is as follows: all values are WNL except  for WBC at 26.9k, Lymphs abs at 21.8k, Creatinine at 1.01, GFR at 56. 07/11/18 LDH is at 163  On review of systems, pt reports stable energy levels, some joint pains, remaining active, eating well, and denies fevers, chills, night sweats, concerns for infections, unexpected weight loss, noticing any new lumps or bumps, back pains, abdominal pains, leg swelling, and any other symptoms.    MEDICAL HISTORY:  Past Medical History:  Diagnosis Date  . Hyperlipidemia   Hypothyroidism  Rt frozen shoulder Osteopenia/osteoporosis  SURGICAL HISTORY: Past Surgical History:  Procedure Laterality Date  . THYROIDECTOMY  1985  hysterectomy with BSO due to prolpased uterus Bunionectomy Meniscal injury -rt knee Bladder cyst removal  SOCIAL HISTORY: Social History   Socioeconomic History  . Marital status: Married    Spouse name: Not on file  . Number of children: Not on file  . Years of education: Not on file  . Highest education level: Not on file  Occupational History  . Not on file  Social Needs  . Financial resource strain: Not on file  . Food insecurity:    Worry: Not on file    Inability: Not on file  . Transportation needs:    Medical: Not on file    Non-medical: Not on file  Tobacco Use  . Smoking status: Never Smoker  . Smokeless tobacco: Never Used  Substance and Sexual Activity  . Alcohol use: No    Frequency: Never  . Drug use: No  . Sexual activity: Not on file  Lifestyle  . Physical activity:    Days per week: Not on file    Minutes per session: Not on file  . Stress: Not on file  Relationships  . Social connections:    Talks on phone: Not on file    Gets together: Not on file    Attends religious service: Not on file    Active member of club or organization: Not on file    Attends meetings of clubs or organizations: Not on file    Relationship status: Not on file  . Intimate partner violence:    Fear of current or ex partner: Not on file    Emotionally  abused: Not on file    Physically abused: Not on file    Forced sexual activity: Not on file  Other Topics Concern  . Not on file  Social History Narrative  . Not on file    FAMILY HISTORY: Family History  Problem Relation Age of Onset  . Thyroid disease Mother   . Cancer Father   . Diabetes Cousin     ALLERGIES:  has No Known Allergies.  MEDICATIONS:  Current Outpatient Medications  Medication Sig Dispense Refill  . ARMOUR THYROID 90 MG tablet TAKE 1/2 A TABLET BY MOUTH DAILY (Patient taking differently: TAKE 1/2 A TABLET BY MOUTH DAILY, not on sunday) 15 tablet 3  . B Complex-C-E-Zn (B COMPLEX-C-E-ZINC) tablet Take 1 tablet by mouth daily.    . Cholecalciferol (VITAMIN D3) 5000 UNITS CAPS Take 5,000 Units by mouth once a week.     . meloxicam (MOBIC) 15 MG tablet Take 1 tablet by mouth daily.    . NON FORMULARY Take 1 capsule by mouth daily.     . NON FORMULARY Take 1 Dose by mouth daily.     . Omega-3 Fatty Acids (FISH OIL) 1000 MG CAPS Take by mouth.    . risedronate (ACTONEL) 150 MG tablet PLEASE SEE ATTACHED FOR DETAILED DIRECTIONS 1 tablet 3  . rosuvastatin (CRESTOR) 10 MG tablet Take 10 mg by mouth daily.    [name] thyroid (ARMOUR THYROID) 90 MG tablet TAKE 1/2 TABLET BY MOUTH DAILY 45 tablet 1   No current facility-administered medications for this visit.     REVIEW OF SYSTEMS:    A 10+ POINT REVIEW OF SYSTEMS WAS OBTAINED including neurology, dermatology, psychiatry, cardiac, respiratory, lymph, extremities, GI, GU, Musculoskeletal, constitutional, breasts, reproductive, HEENT.  All pertinent positives are noted in the HPI.  All others are negative.   PHYSICAL EXAMINATION: ECOG PERFORMANCE STATUS: 0 - Asymptomatic  Vitals:   07/11/18 1447  BP: 107/63  Pulse: 62  Resp: 18  Temp: (!) 97.5 F (36.4 C)  SpO2: 97%   Filed Weights   07/11/18 1447  Weight: 112 lb 9.6 oz (51.1 kg)   .Body mass index is 20.59 kg/m. NAD  GENERAL:alert, in no acute distress and  comfortable SKIN: no acute rashes, no significant lesions EYES: conjunctiva are pink and non-injected, sclera anicteric OROPHARYNX: MMM, no exudates, no oropharyngeal erythema or ulceration NECK: supple, no JVD LYMPH:  no palpable lymphadenopathy in the cervical, axillary or inguinal regions LUNGS: clear to auscultation b/l with normal respiratory effort HEART: regular rate & rhythm ABDOMEN:  normoactive bowel sounds , non tender, not distended. No palpable hepatosplenomegaly.  Extremity: no pedal edema PSYCH: alert & oriented x 3 with fluent speech NEURO: no focal motor/sensory deficits   LABORATORY DATA:  I have reviewed the data as listed  . CBC Latest Ref Rng &  Units 07/11/2018 12/13/2017 08/03/2017  WBC 4.0 - 10.5 K/uL 26.9(H) 25.7(H) 22.4(H)  Hemoglobin 12.0 - 15.0 g/dL 12.7 13.7 13.4  Hematocrit 36.0 - 46.0 % 39.8 41.3 41.0  Platelets 150 - 400 K/uL 176 197 217   . CBC    Component Value Date/Time   WBC 26.9 (H) 07/11/2018 1329   RBC 4.25 07/11/2018 1329   HGB 12.7 07/11/2018 1329   HGB 13.7 12/13/2017 1336   HCT 39.8 07/11/2018 1329   PLT 176 07/11/2018 1329   PLT 197 12/13/2017 1336   MCV 93.6 07/11/2018 1329   MCH 29.9 07/11/2018 1329   MCHC 31.9 07/11/2018 1329   RDW 13.6 07/11/2018 1329   LYMPHSABS 21.8 (H) 07/11/2018 1329   MONOABS 0.8 07/11/2018 1329   EOSABS 0.3 07/11/2018 1329   BASOSABS 0.1 07/11/2018 1329     . CMP Latest Ref Rng & Units 07/11/2018 04/29/2018 12/13/2017  Glucose 70 - 99 mg/dL 97 89 95  BUN 8 - 23 mg/dL 20 19 25   Creatinine 0.44 - 1.00 mg/dL 1.01(H) 0.91 1.35(H)  Sodium 135 - 145 mmol/L 141 137 138  Potassium 3.5 - 5.1 mmol/L 4.2 4.4 4.3  Chloride 98 - 111 mmol/L 105 103 103  CO2 22 - 32 mmol/L 26 30 26   Calcium 8.9 - 10.3 mg/dL 9.5 9.8 9.6  Total Protein 6.5 - 8.1 g/dL 7.0 - 7.2  Total Bilirubin 0.3 - 1.2 mg/dL 0.5 - 0.4  Alkaline Phos 38 - 126 U/L 40 - 42  AST 15 - 41 U/L 25 - 24  ALT 0 - 44 U/L 20 - 18     Component      Latest Ref Rng & Units 08/03/2017  Kappa free light chain     3.3 - 19.4 mg/L 15.0  Lamda free light chains     5.7 - 26.3 mg/L 13.0  Kappa, lamda light chain ratio     0.26 - 1.65 1.15  LDH     125 - 245 U/L 173       FISH:     RADIOGRAPHIC STUDIES:  I have personally reviewed the radiological images as listed and agreed with the findings in the report. No results found.  ASSESSMENT & PLAN:   72 y.o. female with   1) Chronic Lymphocytic Leukemia -likely Rai Stage 0 with monoalleilic 07P deletion No anemia/thrombocytopenia/clinicallyovert splenomegaly or LNadenoapathy. No constitutional symptoms. Newly noted - incidentally on annual labs. No older labs available for reference.  FISH Prognostic panel revealed a monoallelic 71G deletion which  might suggest a more indolent course for her CLL  11/19/17 CT C/A/P revealed no changes in spleen size and no overtly pathologic adenopathy is identified. One upper normal sized lymph node is a 9 mm in short axis left axillary lymph node.   . Lab Results  Component Value Date   LDH 163 07/11/2018   PLAN: -Discussed pt labwork today, 07/11/18; WBC marginally increased over 7 months from 25.7k to 26.9k. Lymphs abs at 21.8k. LDH normal at 163. -The pt shows no clinical or lab progression of her CLL at this time.  -No indication for initiating active treatment at this time.   -Recommended that the pt continue to eat well, drink at least 48-64 oz of water each day, and walk 20-30 minutes each day.   -Pneumovax given in clinic on 12/13/17 -Will see the pt back in 6 months, and will consider alternating 6 month visits with her PCP at that time if all remains stable    .  3) . Patient Active Problem List   Diagnosis Date Noted  . Hypercholesterolemia 05/02/2018  . Age related osteoporosis 08/04/2016  . 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   RTC with Dr Irene Limbo in 33months with labs   All of the patients  questions were answered with apparent satisfaction. The patient knows to call the clinic with any problems, questions or concerns.  The total time spent in the appt was 25 minutes and more than 50% was on counseling and direct patient cares.    Sullivan Lone MD MS AAHIVMS [facility] Hematology/Oncology Physician [facility]  (Office):       [phone] (Work cell):  [phone] (Fax):           [phone]  07/11/2018 3:13 PM  I, Baldwin Jamaica, am acting as a scribe for Dr. Sullivan Lone.   .I have reviewed the above documentation for accuracy and completeness, and I agree with the above. Brunetta Genera MD

## 2018-07-11 ENCOUNTER — Inpatient Hospital Stay: Payer: Medicare HMO | Admitting: Hematology

## 2018-07-11 ENCOUNTER — Inpatient Hospital Stay: Payer: Medicare HMO | Attending: Hematology

## 2018-07-11 ENCOUNTER — Telehealth: Payer: Self-pay | Admitting: Hematology

## 2018-07-11 VITALS — BP 107/63 | HR 62 | Temp 97.5°F | Resp 18 | Ht 62.0 in | Wt 112.6 lb

## 2018-07-11 DIAGNOSIS — C9110 Chronic lymphocytic leukemia of B-cell type not having achieved remission: Secondary | ICD-10-CM

## 2018-07-11 LAB — CMP (CANCER CENTER ONLY)
ALT: 20 U/L (ref 0–44)
AST: 25 U/L (ref 15–41)
Albumin: 4.1 g/dL (ref 3.5–5.0)
Alkaline Phosphatase: 40 U/L (ref 38–126)
Anion gap: 10 (ref 5–15)
BUN: 20 mg/dL (ref 8–23)
CO2: 26 mmol/L (ref 22–32)
Calcium: 9.5 mg/dL (ref 8.9–10.3)
Chloride: 105 mmol/L (ref 98–111)
Creatinine: 1.01 mg/dL — ABNORMAL HIGH (ref 0.44–1.00)
GFR, Est AFR Am: >60 mL/min (ref >60)
GFR, Estimated: 56 mL/min — ABNORMAL LOW (ref >60)
Glucose, Bld: 97 mg/dL (ref 70–99)
Potassium: 4.2 mmol/L (ref 3.5–5.1)
Sodium: 141 mmol/L (ref 135–145)
Total Bilirubin: 0.5 mg/dL (ref 0.3–1.2)
Total Protein: 7 g/dL (ref 6.5–8.1)

## 2018-07-11 LAB — CBC WITH DIFFERENTIAL/PLATELET
Abs Immature Granulocytes: 0.03 10*3/uL (ref 0.00–0.07)
Basophils Absolute: 0.1 10*3/uL (ref 0.0–0.1)
Basophils Relative: 0 %
Eosinophils Absolute: 0.3 10*3/uL (ref 0.0–0.5)
Eosinophils Relative: 1 %
HCT: 39.8 % (ref 36.0–46.0)
Hemoglobin: 12.7 g/dL (ref 12.0–15.0)
Immature Granulocytes: 0 %
Lymphocytes Relative: 82 %
Lymphs Abs: 21.8 10*3/uL — ABNORMAL HIGH (ref 0.7–4.0)
MCH: 29.9 pg (ref 26.0–34.0)
MCHC: 31.9 g/dL (ref 30.0–36.0)
MCV: 93.6 fL (ref 80.0–100.0)
Monocytes Absolute: 0.8 10*3/uL (ref 0.1–1.0)
Monocytes Relative: 3 %
Neutro Abs: 3.9 10*3/uL (ref 1.7–7.7)
Neutrophils Relative %: 14 %
Platelets: 176 10*3/uL (ref 150–400)
RBC: 4.25 MIL/uL (ref 3.87–5.11)
RDW: 13.6 % (ref 11.5–15.5)
WBC: 26.9 10*3/uL — ABNORMAL HIGH (ref 4.0–10.5)
nRBC: 0 % (ref 0.0–0.2)

## 2018-07-11 LAB — LACTATE DEHYDROGENASE: LDH: 163 U/L (ref 98–192)

## 2018-07-11 NOTE — Telephone Encounter (Signed)
Printed calendar and avs. °

## 2018-07-18 ENCOUNTER — Telehealth: Payer: Self-pay | Admitting: Endocrinology

## 2018-07-18 NOTE — Telephone Encounter (Signed)
Patient stated that this medication was suppose to be sent in at her last office visit with Dr Dwyane Dee. But her pharmacy has not received this.       rosuvastatin (CRESTOR) 10 MG tablet      [id number] - [place], [place] - [address] AT [address]

## 2018-07-18 NOTE — Telephone Encounter (Signed)
Do you want me to send this in? According to your last office visit note, you stated that the pt was being treated with Crestor by PCP.

## 2018-07-18 NOTE — Telephone Encounter (Signed)
Okay to send the Crestor prescription

## 2018-07-19 ENCOUNTER — Other Ambulatory Visit: Payer: Self-pay

## 2018-07-19 MED ORDER — ROSUVASTATIN CALCIUM 10 MG PO TABS: 10.0000 mg | ORAL_TABLET | Freq: Every day | ORAL | 1 refills | Status: DC

## 2018-07-19 NOTE — Telephone Encounter (Signed)
Rx sent 

## 2018-08-19 DIAGNOSIS — H40053 Ocular hypertension, bilateral: Secondary | ICD-10-CM | POA: Diagnosis not present

## 2018-08-22 DIAGNOSIS — R6883 Chills (without fever): Secondary | ICD-10-CM | POA: Diagnosis not present

## 2018-08-22 DIAGNOSIS — R05 Cough: Secondary | ICD-10-CM | POA: Diagnosis not present

## 2018-09-27 DIAGNOSIS — Z856 Personal history of leukemia: Secondary | ICD-10-CM | POA: Diagnosis not present

## 2018-09-27 DIAGNOSIS — Z1211 Encounter for screening for malignant neoplasm of colon: Secondary | ICD-10-CM | POA: Diagnosis not present

## 2018-09-27 DIAGNOSIS — R05 Cough: Secondary | ICD-10-CM | POA: Diagnosis not present

## 2018-10-14 ENCOUNTER — Other Ambulatory Visit: Payer: Self-pay

## 2018-10-14 ENCOUNTER — Telehealth: Payer: Self-pay | Admitting: Endocrinology

## 2018-10-14 MED ORDER — ROSUVASTATIN CALCIUM 10 MG PO TABS: 10.0000 mg | ORAL_TABLET | Freq: Every day | ORAL | 1 refills | Status: DC

## 2018-10-14 NOTE — Telephone Encounter (Signed)
Patient stated she would like a call back when this has been sent    MEDICATION: rosuvastatin (CRESTOR) 10 MG tablet  PHARMACY:  [place], [place] [address]  IS THIS A 90 DAY SUPPLY : 90 day supply.   IS PATIENT OUT OF MEDICATION:  no  IF NOT; HOW MUCH IS LEFT: 2 day left.   LAST APPOINTMENT DATE: @12 /27/2019  NEXT APPOINTMENT DATE:@4 /02/2019  DO WE HAVE YOUR PERMISSION TO LEAVE A DETAILED MESSAGE:  OTHER COMMENTS:    **Let patient know to contact pharmacy at the end of the day to make sure medication is ready. **  ** Please notify patient to allow 48-72 hours to process**  **Encourage patient to contact the pharmacy for refills or they can request refills through [facility]**

## 2018-10-14 NOTE — Telephone Encounter (Signed)
Rx sent 

## 2018-10-30 ENCOUNTER — Other Ambulatory Visit: Payer: Self-pay

## 2018-11-04 ENCOUNTER — Ambulatory Visit: Payer: Self-pay | Admitting: Endocrinology

## 2018-11-25 DIAGNOSIS — M25562 Pain in left knee: Secondary | ICD-10-CM | POA: Diagnosis not present

## 2018-12-02 IMAGING — CT CT ABD-PELV W/O CM
2 of 4 series · 14 of 46 positions shown, 16 images · non-contrast
Comparison: None.

CLINICAL DATA: Chronic lymphocytic leukemia staging workup

EXAM:
CT CHEST, ABDOMEN AND PELVIS WITHOUT CONTRAST
TECHNIQUE: Multidetector CT imaging of the chest, abdomen and pelvis was
performed following the standard protocol without IV contrast.

[Series 2: cap w/o · axial · non-contrast · 0.68mm/px · z∈[-575,-80]mm · 11 of 118 slices shown, 13 images]
[im 10/118  soft-tissue]
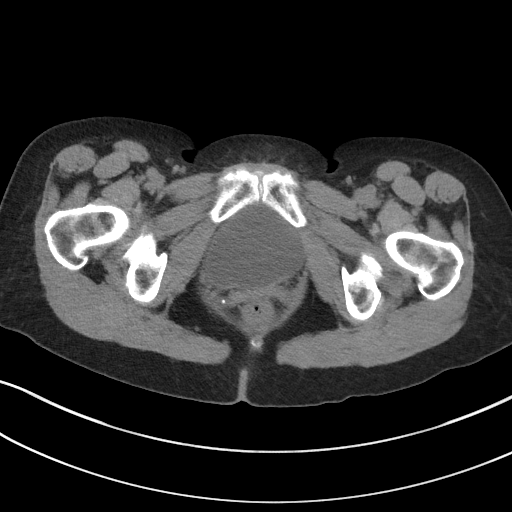
[im 10/118  bone]
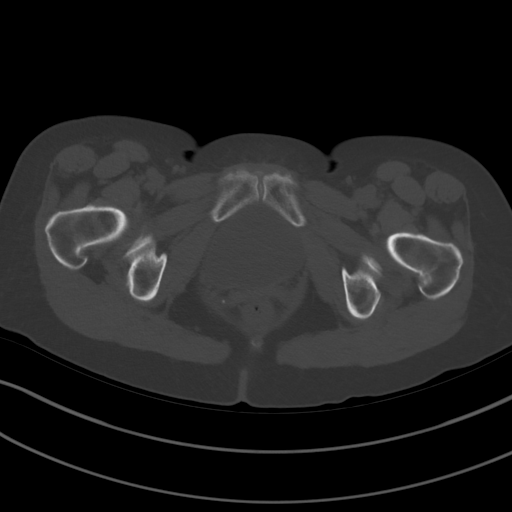
[im 19/118  soft-tissue]
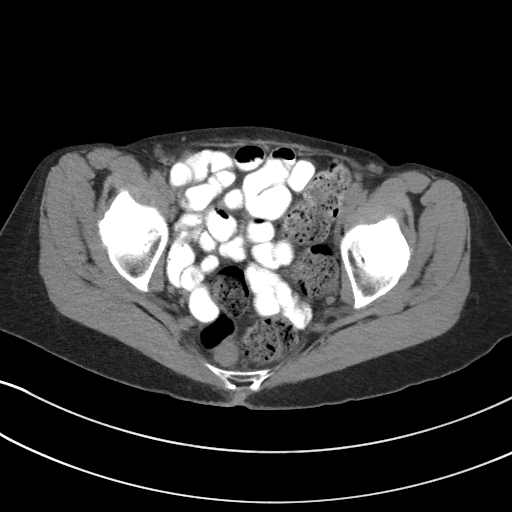
[im 28/118  soft-tissue]
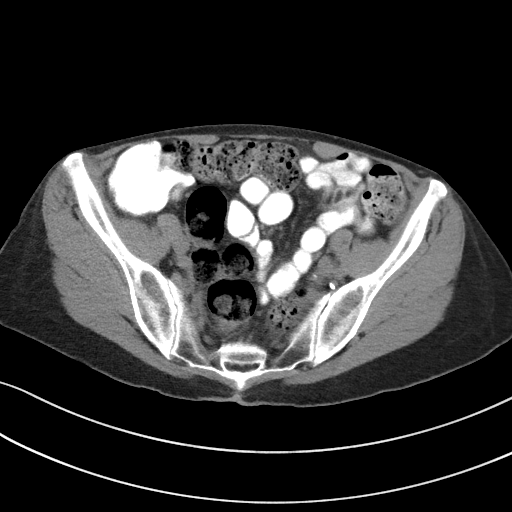
[im 37/118  soft-tissue]
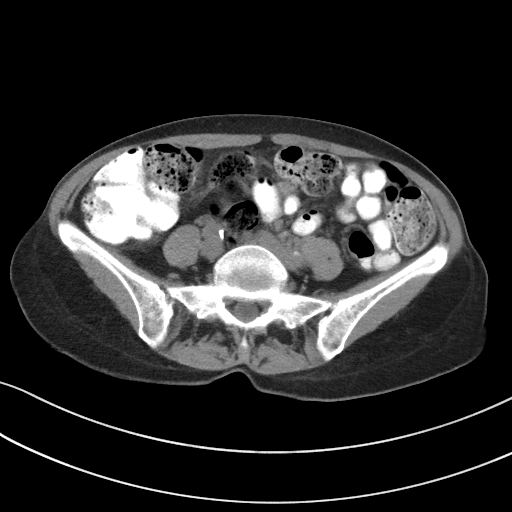
[im 46/118  soft-tissue]
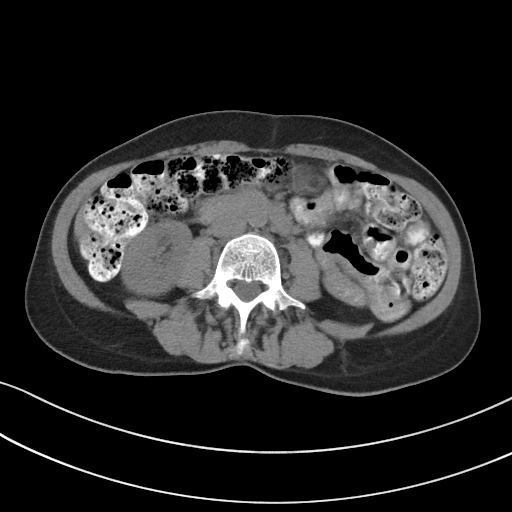
[im 64/118  soft-tissue]
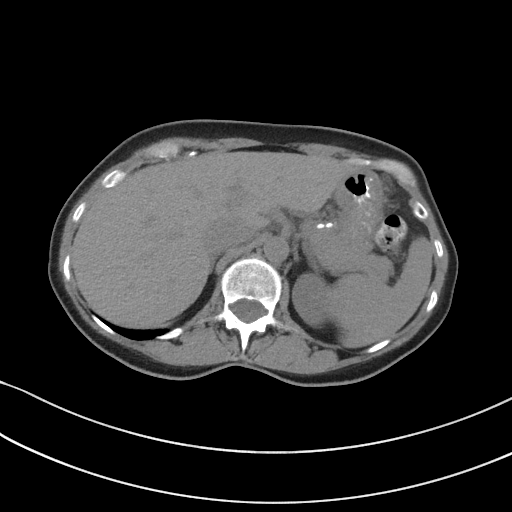
[im 73/118  soft-tissue]
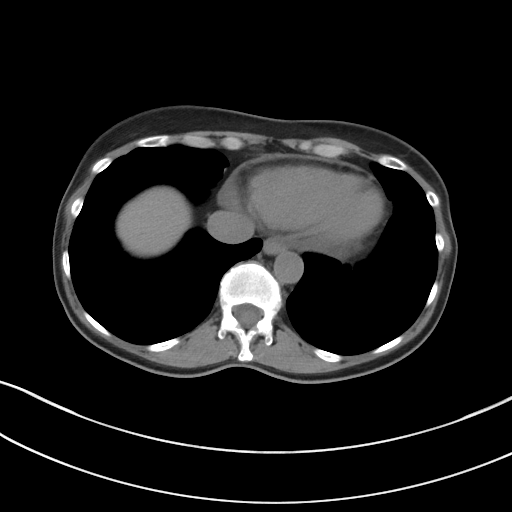
[im 82/118  soft-tissue]
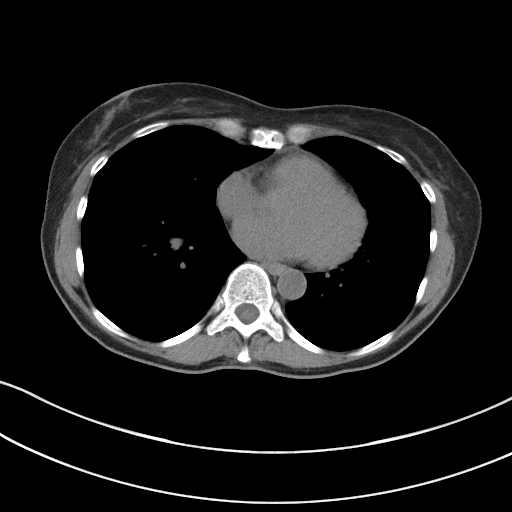
[im 91/118  soft-tissue]
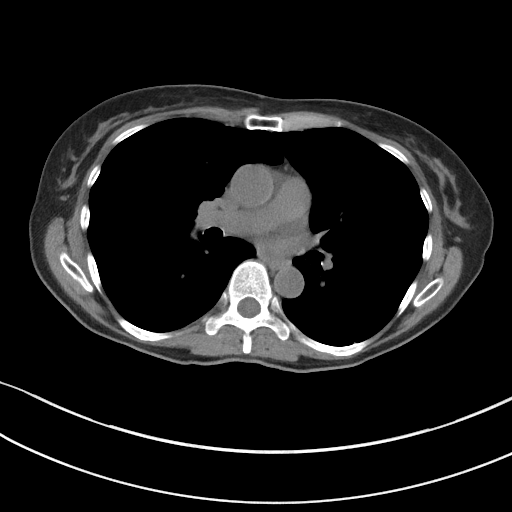
[im 91/118  bone]
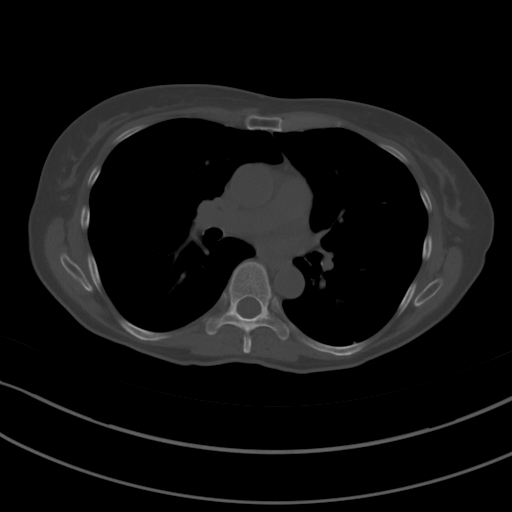
[im 100/118  soft-tissue]
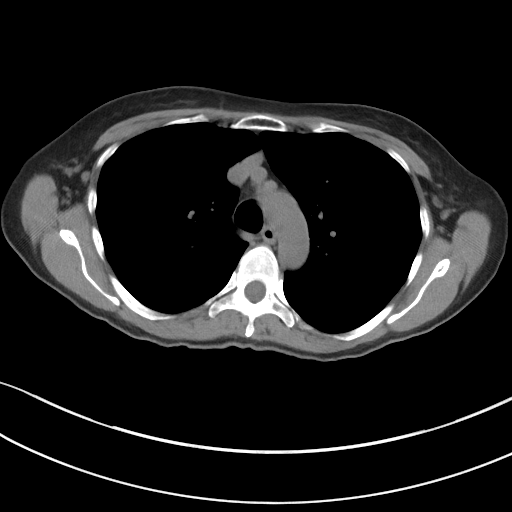
[im 109/118  soft-tissue]
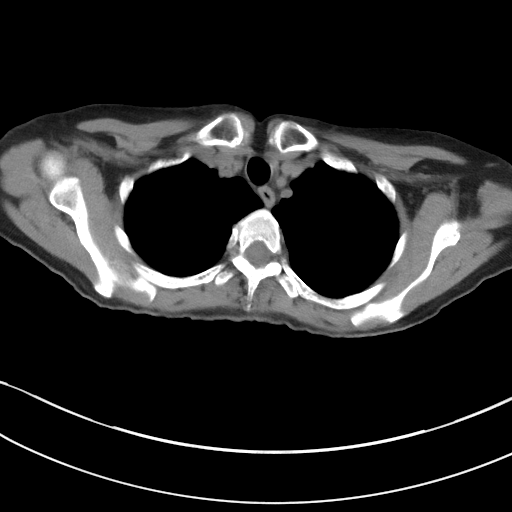

[Series 5: coronals · coronal · 0.79mm/px · 3 of 110 slices shown]
[im 37/110  soft-tissue]
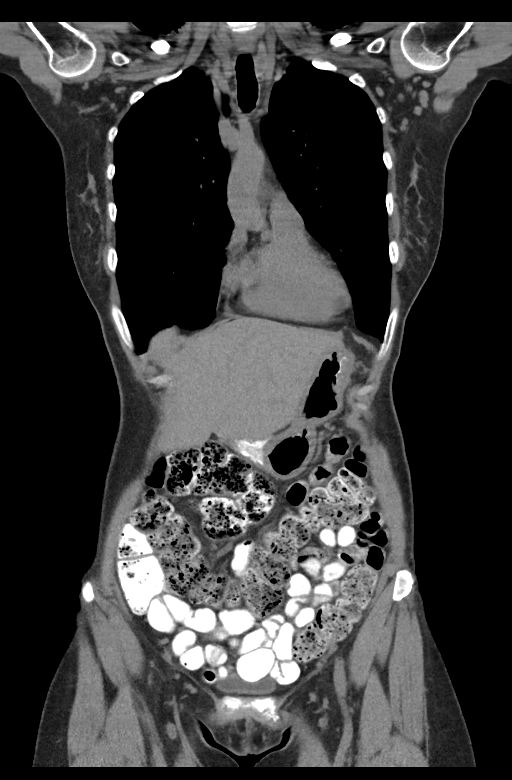
[im 49/110  soft-tissue]
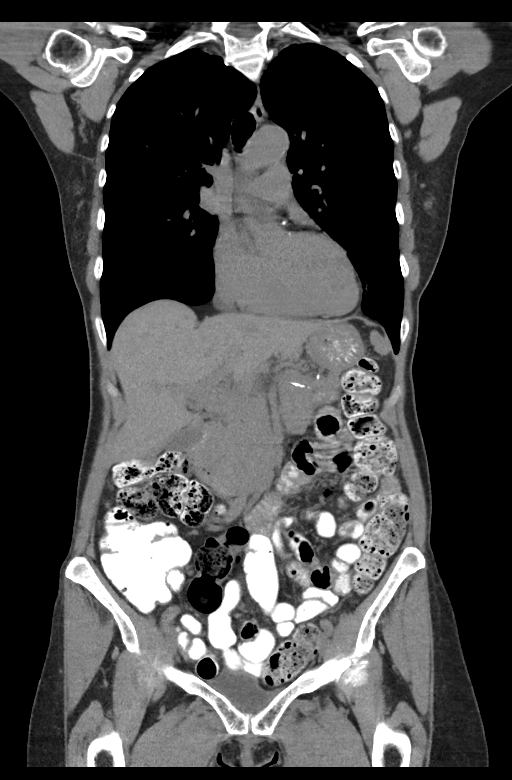
[im 61/110  soft-tissue]
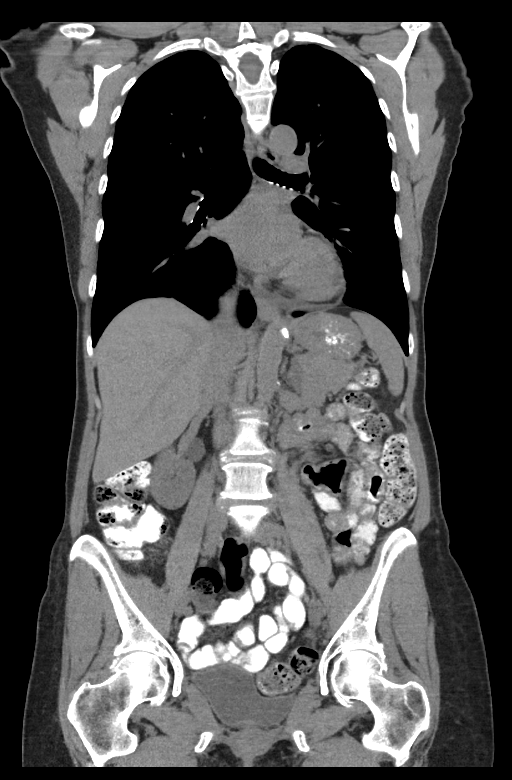

[14 of 46 positions shown; findings below may reference images not displayed]

FINDINGS: CT CHEST FINDINGS

Cardiovascular: Mild atherosclerotic calcification of the aortic
arch and left main coronary artery.

Mediastinum/Nodes: Scattered small bilateral axillary lymph nodes, 1
of the most prominent is a 9 mm in short axis left axillary lymph
node on image [DATE]. Very small subpectoral lymph nodes.

Lungs/Pleura: Biapical pleuroparenchymal scarring. Minimal scarring
or subsegmental atelectasis in the lingula and anteriorly in the
left lower lobe. Pleural-based densities along the left posterior
costophrenic angle measuring up to about 6 mm in thickness. Slight
nodularity along the anterior left lower lobe scarring with very
low-density including some fat density on image 117/4, likely
benign.

Musculoskeletal: Bilateral glenohumeral joint chondrocalcinosis
potentially with calcific tendinopathy of the supraspinatus
musculature bilaterally.

CT ABDOMEN PELVIS FINDINGS

Hepatobiliary: Several tiny hypodense lesions in the liver are
technically nonspecific but statistically likely to be benign. These
include a 5 mm hypodense lesion in segment 4a of the liver on image
51/5. Punctate calcification of the liver capsule adjacent to
Morrison's pouch, likely incidental/postinflammatory.

Pancreas: Unremarkable

Spleen: Unremarkable.  No splenomegaly.

Adrenals/Urinary Tract: Unremarkable

Stomach/Bowel: Prominent stool throughout the colon favors
constipation.

Vascular/Lymphatic: Aortoiliac atherosclerotic vascular disease. No
appreciable pathologically enlarged adenopathy in the
abdomen/pelvis.

Reproductive: Hysterectomy.  Adnexa unremarkable.

Other: No supplemental non-categorized findings.

Musculoskeletal: Lower lumbar spondylosis and degenerative disc
disease. Chondrocalcinosis in the hips with associated spurring and
moderate loss of articular space.
IMPRESSION: 1. No overtly pathologic adenopathy is identified. One upper normal
sized lymph node is a 9 mm in short axis left axillary lymph node.
2. There is some scarring with associated nodularity anteriorly in
the left lower lobe. This is near the pleural surface and has some
internal adipose tissue, and accordingly is likely incidental. There
is some additional pleural plaques are posterior pleural thickening
along the left posterior costophrenic angle which may warrant
periodic surveillance.
3. Other imaging findings of potential clinical significance: Aortic
Atherosclerosis (QTR29-CD0.0). Coronary atherosclerosis. Prominent
stool throughout the colon favors constipation. Lower lumbar
spondylosis and degenerative disc disease. Chondrocalcinosis, query
CPPD arthropathy.

## 2018-12-23 DIAGNOSIS — L570 Actinic keratosis: Secondary | ICD-10-CM | POA: Diagnosis not present

## 2018-12-23 DIAGNOSIS — D1801 Hemangioma of skin and subcutaneous tissue: Secondary | ICD-10-CM | POA: Diagnosis not present

## 2018-12-30 DIAGNOSIS — C9110 Chronic lymphocytic leukemia of B-cell type not having achieved remission: Secondary | ICD-10-CM | POA: Diagnosis not present

## 2018-12-31 ENCOUNTER — Other Ambulatory Visit: Payer: Self-pay | Admitting: Physician Assistant

## 2018-12-31 DIAGNOSIS — Z1231 Encounter for screening mammogram for malignant neoplasm of breast: Secondary | ICD-10-CM

## 2019-01-01 ENCOUNTER — Telehealth: Payer: Self-pay

## 2019-01-01 NOTE — Telephone Encounter (Signed)
Error

## 2019-01-02 ENCOUNTER — Ambulatory Visit (INDEPENDENT_AMBULATORY_CARE_PROVIDER_SITE_OTHER): Payer: Medicare HMO

## 2019-01-02 ENCOUNTER — Other Ambulatory Visit: Payer: Self-pay

## 2019-01-02 DIAGNOSIS — Z1231 Encounter for screening mammogram for malignant neoplasm of breast: Secondary | ICD-10-CM | POA: Diagnosis not present

## 2019-01-03 ENCOUNTER — Other Ambulatory Visit: Payer: Self-pay | Admitting: Physician Assistant

## 2019-01-03 DIAGNOSIS — R928 Other abnormal and inconclusive findings on diagnostic imaging of breast: Secondary | ICD-10-CM

## 2019-01-06 ENCOUNTER — Other Ambulatory Visit: Payer: Self-pay | Admitting: Endocrinology

## 2019-01-08 ENCOUNTER — Other Ambulatory Visit: Payer: Self-pay

## 2019-01-08 ENCOUNTER — Ambulatory Visit
Admission: RE | Admit: 2019-01-08 | Discharge: 2019-01-08 | Disposition: A | Discharge status: Home / Self Care | Payer: Medicare HMO | Source: Ambulatory Visit | Attending: Physician Assistant | Admitting: Physician Assistant

## 2019-01-08 ENCOUNTER — Ambulatory Visit: Payer: Medicare HMO

## 2019-01-08 ENCOUNTER — Other Ambulatory Visit (INDEPENDENT_AMBULATORY_CARE_PROVIDER_SITE_OTHER): Payer: Medicare HMO

## 2019-01-08 DIAGNOSIS — E7800 Pure hypercholesterolemia, unspecified: Secondary | ICD-10-CM | POA: Diagnosis not present

## 2019-01-08 DIAGNOSIS — R928 Other abnormal and inconclusive findings on diagnostic imaging of breast: Secondary | ICD-10-CM | POA: Diagnosis not present

## 2019-01-08 DIAGNOSIS — E559 Vitamin D deficiency, unspecified: Secondary | ICD-10-CM

## 2019-01-08 DIAGNOSIS — E890 Postprocedural hypothyroidism: Secondary | ICD-10-CM

## 2019-01-08 LAB — VITAMIN D 25 HYDROXY (VIT D DEFICIENCY, FRACTURES): VITD: 42.75 ng/mL (ref 30.00–100.00)

## 2019-01-08 LAB — TSH: TSH: 2.58 u[IU]/mL (ref 0.35–4.50)

## 2019-01-08 LAB — T3, FREE: T3, Free: 2.9 pg/mL (ref 2.3–4.2)

## 2019-01-08 LAB — T4, FREE: Free T4: 0.77 ng/dL (ref 0.60–1.60)

## 2019-01-09 LAB — COMPREHENSIVE METABOLIC PANEL
ALT: 17 U/L (ref 0–35)
AST: 23 U/L (ref 0–37)
Albumin: 4.5 g/dL (ref 3.5–5.2)
Alkaline Phosphatase: 36 U/L — ABNORMAL LOW (ref 39–117)
BUN: 24 mg/dL — ABNORMAL HIGH (ref 6–23)
CO2: 24 mEq/L (ref 19–32)
Calcium: 9.9 mg/dL (ref 8.4–10.5)
Chloride: 105 mEq/L (ref 96–112)
Creatinine, Ser: 1.18 mg/dL (ref 0.40–1.20)
GFR: 44.92 mL/min — ABNORMAL LOW (ref >60.00)
Glucose, Bld: 89 mg/dL (ref 70–99)
Potassium: 4.4 mEq/L (ref 3.5–5.1)
Sodium: 141 mEq/L (ref 135–145)
Total Bilirubin: 0.4 mg/dL (ref 0.2–1.2)
Total Protein: 6.9 g/dL (ref 6.0–8.3)

## 2019-01-09 LAB — LIPID PANEL
Cholesterol: 167 mg/dL (ref 0–200)
HDL: 64.3 mg/dL (ref >39.00)
LDL Cholesterol: 84 mg/dL (ref 0–99)
NonHDL: 102.27
Total CHOL/HDL Ratio: 3
Triglycerides: 92 mg/dL (ref 0.0–149.0)
VLDL: 18.4 mg/dL (ref 0.0–40.0)

## 2019-01-12 NOTE — Progress Notes (Signed)
Patient ID: Samantha Graham, female   DOB: 29-Jun-1946, 73 y.o.   MRN: [id number]  Today's office visit was provided via telemedicine using video technique The patient was explained the limitations of evaluation and management by telemedicine and the availability of in person appointments.  The patient understood the limitations and agreed to proceed. Patient also understood that the telehealth visit is billable. . Location of the patient: Patient's home . Location of the provider: Physician office Only the patient and myself were participating in the encounter    Reason for Appointment:  Hypothyroidism, followup visit   History of Present Illness:   The hypothyroidism was first diagnosed in 1985  She has history of hyperthyroidism and thyroid nodule treated with thyroidectomy and ? Radioactive iodine but detailed records are not available of this According to her records since about 2001 had been taking 88 mcg brand name Synthroid  Since about 12/14 her TSH had been relatively low and her dose was reduced gradually to a final dose of 62.5 g She however was having complaints of extreme fatigue, brain fog and wanted to increase her dose to feel better  Because of her fatigue she was empirically started on Armour Thyroid in 04/2014 with 45 g. She did feel less fatigued with this along with less hair loss initially  Since her TSH was low normal previously with the daily dose she was told to skip the dose on Sundays She has been taking the Armour Thyroid daily in the morning before breakfast without any other supplement like calcium  As before she can get tired at times for various reasons including intermittent difficulties with sleep She does not feel unusually hot or cold No fluttering of her heart when taking the medication Does not report any recent weight changes  Her TSH is consistently normal and no increase in T3 level  Labs as follows   Lab Results  Component  Value Date   TSH 2.58 01/08/2019   TSH 1.85 05/02/2018   TSH 3.76 11/01/2017   FREET4 0.77 01/08/2019   FREET4 0.71 05/02/2018   FREET4 0.60 11/01/2017     OTHER active problems addressed today: See review of systems   Lab on 01/08/2019  Component Date Value Ref Range Status  . VITD 01/08/2019 42.75  30.00 - 100.00 ng/mL Final  . Cholesterol 01/08/2019 167  0 - 200 mg/dL Final   ATP III Classification       Desirable:  < 200 mg/dL               Borderline High:  200 - 239 mg/dL          High:  > = 240 mg/dL  . Triglycerides 01/08/2019 92.0  0.0 - 149.0 mg/dL Final   Normal:  <150 mg/dLBorderline High:  150 - 199 mg/dL  . HDL 01/08/2019 64.30  >39.00 mg/dL Final  . VLDL 01/08/2019 18.4  0.0 - 40.0 mg/dL Final  . LDL Cholesterol 01/08/2019 84  0 - 99 mg/dL Final  . Total CHOL/HDL Ratio 01/08/2019 3   Final                  Men          Women1/2 Average Risk     3.4          3.3Average Risk          5.0          4.42X Average Risk  9.6          7.13X Average Risk          15.0          11.0                      . NonHDL 01/08/2019 102.27   Final   NOTE:  Non-HDL goal should be 30 mg/dL higher than patient's LDL goal (i.e. LDL goal of < 70 mg/dL, would have non-HDL goal of < 100 mg/dL)  . T3, Free 01/08/2019 2.9  2.3 - 4.2 pg/mL Final  . Free T4 01/08/2019 0.77  0.60 - 1.60 ng/dL Final   Comment: Specimens from patients who are undergoing biotin therapy and /or ingesting biotin supplements may contain high levels of biotin.  The higher biotin concentration in these specimens interferes with this Free T4 assay.  Specimens that contain high levels  of biotin may cause false high results for this Free T4 assay.  Please interpret results in light of the total clinical presentation of the patient.    [name] TSH 01/08/2019 2.58  0.35 - 4.50 uIU/mL Final  . Sodium 01/08/2019 141  135 - 145 mEq/L Final  . Potassium 01/08/2019 4.4  3.5 - 5.1 mEq/L Final  . Chloride 01/08/2019 105  96 - 112  mEq/L Final  . CO2 01/08/2019 24  19 - 32 mEq/L Final  . Glucose, Bld 01/08/2019 89  70 - 99 mg/dL Final  . BUN 01/08/2019 24* 6 - 23 mg/dL Final  . Creatinine, Ser 01/08/2019 1.18  0.40 - 1.20 mg/dL Final  . Total Bilirubin 01/08/2019 0.4  0.2 - 1.2 mg/dL Final  . Alkaline Phosphatase 01/08/2019 36* 39 - 117 U/L Final  . AST 01/08/2019 23  0 - 37 U/L Final  . ALT 01/08/2019 17  0 - 35 U/L Final  . Total Protein 01/08/2019 6.9  6.0 - 8.3 g/dL Final  . Albumin 01/08/2019 4.5  3.5 - 5.2 g/dL Final  . Calcium 01/08/2019 9.9  8.4 - 10.5 mg/dL Final  . GFR 01/08/2019 44.92* >60.00 mL/min Final    Allergies as of 01/13/2019   No Known Allergies     Medication List       Accurate as of January 12, 2019  9:09 PM. If you have any questions, ask your nurse or doctor.        Armour Thyroid 90 MG tablet Generic drug: thyroid TAKE 1/2 A TABLET BY MOUTH DAILY What changed: See the new instructions.   thyroid 90 MG tablet Commonly known as: Armour Thyroid TAKE 1/2 TABLET BY MOUTH EVERY DAY What changed: Another medication with the same name was changed. Make sure you understand how and when to take each.   b complex-C-E-zinc tablet Take 1 tablet by mouth daily.   Fish Oil 1000 MG Caps Take by mouth.   GREEN TEA EXTRACT PO Take 1 tablet by mouth.   meloxicam 15 MG tablet Commonly known as: MOBIC Take 1 tablet by mouth daily.   NON FORMULARY Take 1 capsule by mouth daily.   NON FORMULARY Take 1 Dose by mouth daily.   risedronate 150 MG tablet Commonly known as: ACTONEL PLEASE SEE ATTACHED FOR DETAILED DIRECTIONS   rosuvastatin 10 MG tablet Commonly known as: CRESTOR Take 1 tablet (10 mg total) by mouth daily.   Vitamin D3 125 MCG (5000 UT) Caps Take 5,000 Units by mouth once a week.       Allergies: No  Known Allergies  Past Medical History:  Diagnosis Date  . Hyperlipidemia     Past Surgical History:  Procedure Laterality Date  . THYROIDECTOMY  1985     Family History  Problem Relation Age of Onset  . Thyroid disease Mother   . Cancer Father   . Diabetes Cousin     Social History:  reports that she has never smoked. She has never used smokeless tobacco. She reports that she does not drink alcohol or use drugs.  REVIEW Of SYSTEMS:  She usually maintains her weight   Wt Readings from Last 3 Encounters:  07/11/18 112 lb 9.6 oz (51.1 kg)  05/02/18 113 lb (51.3 kg)  12/13/17 115 lb 3.2 oz (52.3 kg)    Osteoporosis: Baseline bone density done by gynecologist showed T score was -3.0     Bone density on 08/08/16 shows T scores of -2.4 at spine (improved) and -2.2 at hip, stable  She was concerned about lack of efficacy of Fosamax with her mother She was taking ACTONEL but stopped this because of perception that she was having joint pains after taking this for some time  Also concerned about possible hair loss as a side effect  Reclast was given in 5/16, she did have some nausea the next day Does not want to take Reclast again  She was given information on Prolia in 2019 However she has not wanted to start this also  Vitamin D supplements: Taking 5000 units weekly  Vitamin D level has been consistently normal  Lab Results  Component Value Date   VD25OH 42.75 01/08/2019   VD25OH 41.79 10/29/2017   VD25OH 69.90 10/31/2016     CLL: She is being followed for asymptomatic disease and no treatment recommended as yet  Hypercholesterolemia: Apparently her baseline LDL was 210 and she is being treated with Crestor 10 mg LDL is slightly better than on her last visit She is asking about reducing or stopping the medication since lipids are controlled Usually eating a low-cholesterol low-fat diet  Lab Results  Component Value Date   CHOL 167 01/08/2019   HDL 64.30 01/08/2019   LDLCALC 84 01/08/2019   TRIG 92.0 01/08/2019   CHOLHDL 3 01/08/2019      Examination:   There were no vitals taken for this visit.      Assessment /Plan  Patient's history and lab results are discussed in detail above  Hypothyroidism, long-standing, postsurgical   She is taking Armour Thyroid half of 90 mg, 6 days a week  Doing subjectively well with no change in her overall energy level or other symptoms  She appears to be needing a consistently stable dose now with normal TSH She has been compliant with this and she will continue   OSTEOPOROSIS: No history of fracture   Had stopped Actonel because she thought it was causing joint pains 2 weeks after she takes this  She is refusing to consider restarting this as well as Prolia or Reclast  However her bone density did improve in 2018 with treatment, baseline T score -3.0 She however agrees to do another bone density in 3 months to decide on future plans  Vitamin D adequately replaced and she will have level to be checked annually  LIPIDS: Very well controlled and discussed need for treatment, LDL goals and current results Discussed continuing low saturated fat diet  All the patient's questions were answered    Follow-up in 6 months   There are no Patient Instructions on file  for this visit.   Elayne Snare 01/12/2019, 9:09 PM

## 2019-01-13 ENCOUNTER — Encounter: Payer: Self-pay | Admitting: Endocrinology

## 2019-01-13 ENCOUNTER — Other Ambulatory Visit: Payer: Self-pay

## 2019-01-13 ENCOUNTER — Ambulatory Visit (INDEPENDENT_AMBULATORY_CARE_PROVIDER_SITE_OTHER): Payer: Medicare HMO | Admitting: Endocrinology

## 2019-01-13 DIAGNOSIS — E890 Postprocedural hypothyroidism: Secondary | ICD-10-CM | POA: Diagnosis not present

## 2019-01-13 DIAGNOSIS — E559 Vitamin D deficiency, unspecified: Secondary | ICD-10-CM | POA: Diagnosis not present

## 2019-01-13 DIAGNOSIS — M810 Age-related osteoporosis without current pathological fracture: Secondary | ICD-10-CM

## 2019-01-13 DIAGNOSIS — E7800 Pure hypercholesterolemia, unspecified: Secondary | ICD-10-CM

## 2019-01-13 MED ORDER — ROSUVASTATIN CALCIUM 10 MG PO TABS: 10.0000 mg | ORAL_TABLET | Freq: Every day | ORAL | 1 refills | Status: DC

## 2019-01-28 ENCOUNTER — Telehealth: Payer: Self-pay | Admitting: *Deleted

## 2019-01-28 NOTE — Telephone Encounter (Signed)
Patient alternates appointments between Dr.Kale and [facility], seeing someone every 3 months. Due to Covid 19, her appt at [facility] did not happen until June, so she needs to reschedule her upcoming appt with Dr. Irene Limbo (7/9) until the end of September. Appts for 7/9 will be cancelled and message sent to scheduling for new appts. Patient in agreement.

## 2019-01-29 ENCOUNTER — Telehealth: Payer: Self-pay | Admitting: Hematology

## 2019-01-29 NOTE — Telephone Encounter (Signed)
Scheduled appt per 7/07 sch message - pt aware of appt date and time   

## 2019-01-30 ENCOUNTER — Ambulatory Visit: Payer: Self-pay | Admitting: Hematology

## 2019-01-30 ENCOUNTER — Other Ambulatory Visit: Payer: Self-pay

## 2019-02-03 ENCOUNTER — Telehealth: Payer: Self-pay | Admitting: Endocrinology

## 2019-02-03 NOTE — Telephone Encounter (Signed)
Patient requesting order for bone density scan

## 2019-02-03 NOTE — Telephone Encounter (Signed)
Patient is calling to request her bone density scan be scheduled per her conversation with Dr Dwyane Dee on 01/13/2019

## 2019-02-04 NOTE — Telephone Encounter (Signed)
She is not due for until September, will order at the end of August

## 2019-02-04 NOTE — Telephone Encounter (Signed)
Called pt and left detailed voicemail with MD message. 

## 2019-02-11 DIAGNOSIS — H40053 Ocular hypertension, bilateral: Secondary | ICD-10-CM | POA: Diagnosis not present

## 2019-02-17 DIAGNOSIS — R69 Illness, unspecified: Secondary | ICD-10-CM | POA: Diagnosis not present

## 2019-02-27 DIAGNOSIS — R140 Abdominal distension (gaseous): Secondary | ICD-10-CM | POA: Diagnosis not present

## 2019-02-27 DIAGNOSIS — E739 Lactose intolerance, unspecified: Secondary | ICD-10-CM | POA: Diagnosis not present

## 2019-02-27 DIAGNOSIS — Z1211 Encounter for screening for malignant neoplasm of colon: Secondary | ICD-10-CM | POA: Diagnosis not present

## 2019-02-27 DIAGNOSIS — Z23 Encounter for immunization: Secondary | ICD-10-CM | POA: Diagnosis not present

## 2019-03-06 DIAGNOSIS — Z8619 Personal history of other infectious and parasitic diseases: Secondary | ICD-10-CM | POA: Diagnosis not present

## 2019-03-17 DIAGNOSIS — T781XXD Other adverse food reactions, not elsewhere classified, subsequent encounter: Secondary | ICD-10-CM | POA: Diagnosis not present

## 2019-03-17 DIAGNOSIS — J309 Allergic rhinitis, unspecified: Secondary | ICD-10-CM | POA: Diagnosis not present

## 2019-04-01 ENCOUNTER — Other Ambulatory Visit: Payer: Self-pay | Admitting: Endocrinology

## 2019-04-01 ENCOUNTER — Telehealth: Payer: Self-pay | Admitting: Endocrinology

## 2019-04-01 DIAGNOSIS — M810 Age-related osteoporosis without current pathological fracture: Secondary | ICD-10-CM

## 2019-04-01 NOTE — Telephone Encounter (Signed)
This has been ordered for [place] main office

## 2019-04-01 NOTE — Telephone Encounter (Signed)
Pt states at last appt she was told to have a bone density this month, there is no order placed for her to schedule it can this be added and set up

## 2019-04-07 DIAGNOSIS — R69 Illness, unspecified: Secondary | ICD-10-CM | POA: Diagnosis not present

## 2019-04-12 NOTE — Progress Notes (Signed)
HEMATOLOGY/ONCOLOGY CLINIC NOTE  Date of Service: 04/16/19    Patient Care Team: Kieth Brightly as PCP - General (Physician Assistant)  CHIEF COMPLAINTS/PURPOSE OF CONSULTATION:  F/u for recently diagnosed CLL  HISTORY OF PRESENTING ILLNESS:   Samantha Graham is a wonderful 73 y.o. female who has been referred to [place] by Dr. Meda Coffee, Faylene Million, PA-C for evaluation and management of incidentally noted leucocytosis/Lymphocytosis.  Patient is generally in good overall health and on labs done for her annual physical in 06/2017 on labs was incidentally noted to have leucocytosis of 21.7k with lymphocytosis of 17.7k. Nl hgb of 13.8 and nl platelet count of 200k. She notes no new fatigue, fevers/chills/night sweats, overt LN enlargement, abdominal pain or distension. No recent viral infection. It is unclear if she has been noted to have lymphocytosis prior to these labs (old labs not available to [place] currently).  She was referred to [place] for further evaluation of her lymphocytosis.  She notes she feels no differently than has has felt over the last few years.  Patient notes that she is a non smoker. No other recent chemical exposures. No PRBC transfusions in the past.  Interval History:  Samantha Graham is a wonderful 73 y.o. female who is following up today regarding her Chronic Lymphocytic Leukemia. The patient's last visit with [place] was on 07/11/2018. The pt reports that he is doing well overall.  The pt reports that she does experience "pockets" of fatigue. Pt has recently been diagnosed with gout. She is interested in managing her gout through her diet. Pt reports that she has been getting out and walking and her gym has recently opened back up.   Lab results today (04/16/19) of CBC w/diff and CMP is as follows: all values are WNL except for WBC at 34.2K, Lymphs Abs at 29.7K, Basophils Abs at 0.2K, BUN at 24, Creatinine at 1.02, GFR Est Non Af Am at 55. 04/16/2019 LDH is 149   On review of systems, pt reports fatigue and denies abdominal pain and any other symptoms.   MEDICAL HISTORY:  Past Medical History:  Diagnosis Date  . Hyperlipidemia   Hypothyroidism  Rt frozen shoulder Osteopenia/osteoporosis  SURGICAL HISTORY: Past Surgical History:  Procedure Laterality Date  . THYROIDECTOMY  1985  hysterectomy with BSO due to prolpased uterus Bunionectomy Meniscal injury -rt knee Bladder cyst removal  SOCIAL HISTORY: Social History   Socioeconomic History  . Marital status: Married    Spouse name: Not on file  . Number of children: Not on file  . Years of education: Not on file  . Highest education level: Not on file  Occupational History  . Not on file  Social Needs  . Financial resource strain: Not on file  . Food insecurity    Worry: Not on file    Inability: Not on file  . Transportation needs    Medical: Not on file    Non-medical: Not on file  Tobacco Use  . Smoking status: Never Smoker  . Smokeless tobacco: Never Used  Substance and Sexual Activity  . Alcohol use: No    Frequency: Never  . Drug use: No  . Sexual activity: Not on file  Lifestyle  . Physical activity    Days per week: Not on file    Minutes per session: Not on file  . Stress: Not on file  Relationships  . Social Herbalist on phone: Not on file    Gets together: Not  on file    Attends religious service: Not on file    Active member of club or organization: Not on file    Attends meetings of clubs or organizations: Not on file    Relationship status: Not on file  . Intimate partner violence    Fear of current or ex partner: Not on file    Emotionally abused: Not on file    Physically abused: Not on file    Forced sexual activity: Not on file  Other Topics Concern  . Not on file  Social History Narrative  . Not on file    FAMILY HISTORY: Family History  Problem Relation Age of Onset  . Thyroid disease Mother   . Cancer Father   . Diabetes  Cousin     ALLERGIES:  has No Known Allergies.  MEDICATIONS:  Current Outpatient Medications  Medication Sig Dispense Refill  . ARMOUR THYROID 90 MG tablet TAKE 1/2 A TABLET BY MOUTH DAILY (Patient taking differently: TAKE 1/2 A TABLET BY MOUTH DAILY, not on sunday) 15 tablet 3  . B Complex-C-E-Zn (B COMPLEX-C-E-ZINC) tablet Take 1 tablet by mouth daily.    . Cholecalciferol (VITAMIN D3) 5000 UNITS CAPS Take 5,000 Units by mouth once a week.     Nyoka Cowden Tea, Camellia sinensis, (GREEN TEA EXTRACT PO) Take 1 tablet by mouth.    . meloxicam (MOBIC) 15 MG tablet Take 1 tablet by mouth daily.    . NON FORMULARY Take 1 capsule by mouth daily.     . NON FORMULARY Take 1 Dose by mouth daily.     . Omega-3 Fatty Acids (FISH OIL) 1000 MG CAPS Take by mouth.    . risedronate (ACTONEL) 150 MG tablet PLEASE SEE ATTACHED FOR DETAILED DIRECTIONS (Patient not taking: Reported on 01/13/2019) 1 tablet 3  . rosuvastatin (CRESTOR) 10 MG tablet Take 1 tablet (10 mg total) by mouth daily. 90 tablet 1   No current facility-administered medications for this visit.     REVIEW OF SYSTEMS:    A 10+ POINT REVIEW OF SYSTEMS WAS OBTAINED including neurology, dermatology, psychiatry, cardiac, respiratory, lymph, extremities, GI, GU, Musculoskeletal, constitutional, breasts, reproductive, HEENT.  All pertinent positives are noted in the HPI.  All others are negative.   PHYSICAL EXAMINATION: ECOG PERFORMANCE STATUS: 0 - Asymptomatic  There were no vitals filed for this visit. There were no vitals filed for this visit. .There is no height or weight on file to calculate BMI.   GENERAL:alert, in no acute distress and comfortable SKIN: no acute rashes, no significant lesions EYES: conjunctiva are pink and non-injected, sclera anicteric OROPHARYNX: MMM, no exudates, no oropharyngeal erythema or ulceration NECK: supple, no JVD LYMPH:  no palpable lymphadenopathy in the cervical, axillary or inguinal regions LUNGS:  clear to auscultation b/l with normal respiratory effort HEART: regular rate & rhythm ABDOMEN:  normoactive bowel sounds , non tender, not distended. No palpable hepatosplenomegaly.  Extremity: no pedal edema PSYCH: alert & oriented x 3 with fluent speech NEURO: no focal motor/sensory deficits  LABORATORY DATA:  I have reviewed the data as listed  . CBC Latest Ref Rng & Units 04/16/2019 07/11/2018 12/13/2017  WBC 4.0 - 10.5 K/uL 34.2(H) 26.9(H) 25.7(H)  Hemoglobin 12.0 - 15.0 g/dL 14.0 12.7 13.7  Hematocrit 36.0 - 46.0 % 42.7 39.8 41.3  Platelets 150 - 400 K/uL 205 176 197   . CBC    Component Value Date/Time   WBC 34.2 (H) 04/16/2019 1011   RBC 4.59  04/16/2019 1011   HGB 14.0 04/16/2019 1011   HGB 13.7 12/13/2017 1336   HCT 42.7 04/16/2019 1011   PLT 205 04/16/2019 1011   PLT 197 12/13/2017 1336   MCV 93.0 04/16/2019 1011   MCH 30.5 04/16/2019 1011   MCHC 32.8 04/16/2019 1011   RDW 13.7 04/16/2019 1011   LYMPHSABS 29.7 (H) 04/16/2019 1011   MONOABS 0.7 04/16/2019 1011   EOSABS 0.2 04/16/2019 1011   BASOSABS 0.2 (H) 04/16/2019 1011     . CMP Latest Ref Rng & Units 04/16/2019 01/08/2019 07/11/2018  Glucose 70 - 99 mg/dL 96 89 97  BUN 8 - 23 mg/dL 24(H) 24(H) 20  Creatinine 0.44 - 1.00 mg/dL 1.02(H) 1.18 1.01(H)  Sodium 135 - 145 mmol/L 138 141 141  Potassium 3.5 - 5.1 mmol/L 4.6 4.4 4.2  Chloride 98 - 111 mmol/L 102 105 105  CO2 22 - 32 mmol/L 28 24 26   Calcium 8.9 - 10.3 mg/dL 9.8 9.9 9.5  Total Protein 6.5 - 8.1 g/dL 7.2 6.9 7.0  Total Bilirubin 0.3 - 1.2 mg/dL 0.4 0.4 0.5  Alkaline Phos 38 - 126 U/L 44 36(L) 40  AST 15 - 41 U/L 25 23 25   ALT 0 - 44 U/L 18 17 20      Component     Latest Ref Rng & Units 08/03/2017  Kappa free light chain     3.3 - 19.4 mg/L 15.0  Lamda free light chains     5.7 - 26.3 mg/L 13.0  Kappa, lamda light chain ratio     0.26 - 1.65 1.15  LDH     125 - 245 U/L 173       FISH:     RADIOGRAPHIC STUDIES:  I have  personally reviewed the radiological images as listed and agreed with the findings in the report. No results found.  ASSESSMENT & PLAN:   73 y.o. 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 No constitutional symptoms. Newly noted - incidentally on annual labs. No older labs available for reference.  FISH Prognostic panel revealed a monoallelic 0000000 deletion which  might suggest a more indolent course for her CLL  11/19/17 CT C/A/P revealed no changes in spleen size and no overtly pathologic adenopathy is identified. One upper normal sized lymph node is a 9 mm in short axis left axillary lymph node.   . Lab Results  Component Value Date   LDH 163 07/11/2018   PLAN: -Discussed pt labwork today, 04/16/19; all values are WNL except for WBC at 34.2K, Lymphs Abs at 29.7K, Basophils Abs at 0.2K, BUN at 24, Creatinine at 1.02, GFR Est Non Af Am at 55. RBC are stable. WBC are slowly going up but nothing unexpected.  -Discussed 04/16/2019 LDH is 149 -The pt shows no clinical or lab progression of her CLL at this time.  -No indication for initiating active treatment at this time.   -Pneumovax given in clinic on 12/13/17 -Advised pt to continue to drink water  -Will see back in 6 months with labs   .3) . Patient Active Problem List   Diagnosis Date Noted  . Hypercholesterolemia 05/02/2018  . Age related osteoporosis 08/04/2016  . 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   FOLLOW UP: RTC with Dr Irene Limbo with labs in 6 months  The total time spent in the appt was 15 minutes and more than 50% was on counseling  and direct patient cares.  All of the patient's questions were answered with apparent satisfaction. The patient knows to call the clinic with any problems, questions or concerns.    Sullivan Lone MD [place] AAHIVMS [facility]  Hematology/Oncology Physician [facility]  (Office):       [phone] (Work cell):  [phone] (Fax):           [phone]  04/16/2019 10:50 AM  I, Yevette Edwards, am acting as a scribe for Dr. Sullivan Lone.   .I have reviewed the above documentation for accuracy and completeness, and I agree with the above. Brunetta Genera MD

## 2019-04-16 ENCOUNTER — Other Ambulatory Visit: Payer: Self-pay

## 2019-04-16 ENCOUNTER — Inpatient Hospital Stay: Payer: Medicare HMO | Attending: Hematology | Admitting: Hematology

## 2019-04-16 ENCOUNTER — Inpatient Hospital Stay: Payer: Medicare HMO

## 2019-04-16 VITALS — BP 100/62 | Temp 98.0°F | Resp 18 | Ht 62.0 in | Wt 118.6 lb

## 2019-04-16 DIAGNOSIS — C9110 Chronic lymphocytic leukemia of B-cell type not having achieved remission: Secondary | ICD-10-CM | POA: Diagnosis not present

## 2019-04-16 LAB — CMP (CANCER CENTER ONLY)
ALT: 18 U/L (ref 0–44)
AST: 25 U/L (ref 15–41)
Albumin: 4.4 g/dL (ref 3.5–5.0)
Alkaline Phosphatase: 44 U/L (ref 38–126)
Anion gap: 8 (ref 5–15)
BUN: 24 mg/dL — ABNORMAL HIGH (ref 8–23)
CO2: 28 mmol/L (ref 22–32)
Calcium: 9.8 mg/dL (ref 8.9–10.3)
Chloride: 102 mmol/L (ref 98–111)
Creatinine: 1.02 mg/dL — ABNORMAL HIGH (ref 0.44–1.00)
GFR, Est AFR Am: >60 mL/min (ref >60)
GFR, Estimated: 55 mL/min — ABNORMAL LOW (ref >60)
Glucose, Bld: 96 mg/dL (ref 70–99)
Potassium: 4.6 mmol/L (ref 3.5–5.1)
Sodium: 138 mmol/L (ref 135–145)
Total Bilirubin: 0.4 mg/dL (ref 0.3–1.2)
Total Protein: 7.2 g/dL (ref 6.5–8.1)

## 2019-04-16 LAB — CBC WITH DIFFERENTIAL/PLATELET
Abs Immature Granulocytes: 0.03 10*3/uL (ref 0.00–0.07)
Basophils Absolute: 0.2 10*3/uL — ABNORMAL HIGH (ref 0.0–0.1)
Basophils Relative: 0 %
Eosinophils Absolute: 0.2 10*3/uL (ref 0.0–0.5)
Eosinophils Relative: 1 %
HCT: 42.7 % (ref 36.0–46.0)
Hemoglobin: 14 g/dL (ref 12.0–15.0)
Immature Granulocytes: 0 %
Lymphocytes Relative: 87 %
Lymphs Abs: 29.7 10*3/uL — ABNORMAL HIGH (ref 0.7–4.0)
MCH: 30.5 pg (ref 26.0–34.0)
MCHC: 32.8 g/dL (ref 30.0–36.0)
MCV: 93 fL (ref 80.0–100.0)
Monocytes Absolute: 0.7 10*3/uL (ref 0.1–1.0)
Monocytes Relative: 2 %
Neutro Abs: 3.4 10*3/uL (ref 1.7–7.7)
Neutrophils Relative %: 10 %
Platelets: 205 10*3/uL (ref 150–400)
RBC: 4.59 MIL/uL (ref 3.87–5.11)
RDW: 13.7 % (ref 11.5–15.5)
WBC: 34.2 10*3/uL — ABNORMAL HIGH (ref 4.0–10.5)
nRBC: 0 % (ref 0.0–0.2)

## 2019-04-16 LAB — LACTATE DEHYDROGENASE: LDH: 149 U/L (ref 98–192)

## 2019-04-17 ENCOUNTER — Telehealth: Payer: Self-pay | Admitting: Hematology

## 2019-04-17 NOTE — Telephone Encounter (Signed)
Scheduled appt per 9/23 los.  Spoke with patient spouse and he is aware of the appt date and time.

## 2019-04-29 ENCOUNTER — Ambulatory Visit (INDEPENDENT_AMBULATORY_CARE_PROVIDER_SITE_OTHER)
Admission: RE | Admit: 2019-04-29 | Discharge: 2019-04-29 | Disposition: A | Discharge status: Home / Self Care | Payer: Medicare HMO | Source: Ambulatory Visit | Attending: Endocrinology | Admitting: Endocrinology

## 2019-04-29 ENCOUNTER — Other Ambulatory Visit: Payer: Self-pay

## 2019-04-29 DIAGNOSIS — M810 Age-related osteoporosis without current pathological fracture: Secondary | ICD-10-CM | POA: Diagnosis not present

## 2019-05-06 NOTE — Progress Notes (Signed)
Please call to let patient know that the bone density results are about the same

## 2019-05-07 ENCOUNTER — Telehealth: Payer: Self-pay | Admitting: Endocrinology

## 2019-05-07 NOTE — Telephone Encounter (Signed)
Patient called back regarding results from bone scan - call back number is [phone]

## 2019-05-07 NOTE — Telephone Encounter (Signed)
We can wait to start any specific treatment for osteoporosis, will discuss further on her visit

## 2019-05-07 NOTE — Telephone Encounter (Signed)
Pt stated that she understands that the results are the same as the previous scan, but pt stated that she doesn't understand what that means.

## 2019-05-08 ENCOUNTER — Telehealth: Payer: Self-pay | Admitting: Endocrinology

## 2019-05-08 NOTE — Telephone Encounter (Signed)
Please set up telephone call for pt.

## 2019-05-08 NOTE — Telephone Encounter (Signed)
Patient has questions re: bone density test and requests that Olen Cordial call patient at ph# [phone]

## 2019-05-08 NOTE — Telephone Encounter (Signed)
We can set up a virtual visit

## 2019-05-08 NOTE — Telephone Encounter (Signed)
Patient does not want to wait until December to discuss this. Pt is wondering about these results. She wants to know if she has osteoporosis, if she continues her same treatment, etc.

## 2019-05-16 NOTE — Telephone Encounter (Signed)
Patient is scheduled for MD to Patient on 05/20/19 at 1:15 p.m.

## 2019-05-20 ENCOUNTER — Ambulatory Visit (INDEPENDENT_AMBULATORY_CARE_PROVIDER_SITE_OTHER): Payer: Medicare HMO | Admitting: Endocrinology

## 2019-05-20 ENCOUNTER — Other Ambulatory Visit: Payer: Self-pay

## 2019-05-20 ENCOUNTER — Encounter: Payer: Self-pay | Admitting: Endocrinology

## 2019-05-20 DIAGNOSIS — E559 Vitamin D deficiency, unspecified: Secondary | ICD-10-CM

## 2019-05-20 DIAGNOSIS — M810 Age-related osteoporosis without current pathological fracture: Secondary | ICD-10-CM | POA: Diagnosis not present

## 2019-05-20 NOTE — Progress Notes (Signed)
Patient ID: Luci Bank, female   DOB: [birth year], 73 y.o.   MRN: [id number]  Today's office visit was provided via telemedicine using audio technique The patient was explained the limitations of evaluation and management by telemedicine and the availability of in person appointments.  The patient understood the limitations and agreed to proceed. Patient also understood that the telehealth visit is billable. . Location of the patient: Patient's home . Location of the provider: Physician office Only the patient and myself were participating in the encounter    Reason for Appointment: Osteoporosis, followup visit   History of Present Illness:   See review of systems for osteoporosis history  The following is a copy of the previous note regarding her hypothyroidism  The hypothyroidism was first diagnosed in 1985  She has history of hyperthyroidism and thyroid nodule treated with thyroidectomy and ? Radioactive iodine but detailed records are not available of this According to her records since about 2001 had been taking 88 mcg brand name Synthroid  Since about 12/14 her TSH had been relatively low and her dose was reduced gradually to a final dose of 62.5 g She however was having complaints of extreme fatigue, brain fog and wanted to increase her dose to feel better  Because of her fatigue she was empirically started on Armour Thyroid in 04/2014 with 45 g. She did feel less fatigued with this along with less hair loss initially  Since her TSH was low normal previously with the daily dose she was told to skip the dose on Sundays She has been taking the Armour Thyroid daily in the morning before breakfast without any other supplement like calcium  As before she can get tired at times for various reasons including intermittent difficulties with sleep She does not feel unusually hot or cold No fluttering of her heart when taking the medication Does not report any recent  weight changes  Her TSH is consistently normal and no increase in T3 level  Labs as follows   Lab Results  Component Value Date   TSH 2.58 01/08/2019   TSH 1.85 05/02/2018   TSH 3.76 11/01/2017   FREET4 0.77 01/08/2019   FREET4 0.71 05/02/2018   FREET4 0.60 11/01/2017     OTHER active problems addressed today: See review of systems   No visits with results within 1 Week(s) from this visit.  Latest known visit with results is:  Appointment on 04/16/2019  Component Date Value Ref Range Status  . LDH 04/16/2019 149  98 - 192 U/L Final   Performed at [facility], [place] [address] [place], [place] [address]  . Sodium 04/16/2019 138  135 - 145 mmol/L Final  . Potassium 04/16/2019 4.6  3.5 - 5.1 mmol/L Final  . Chloride 04/16/2019 102  98 - 111 mmol/L Final  . CO2 04/16/2019 28  22 - 32 mmol/L Final  . Glucose, Bld 04/16/2019 96  70 - 99 mg/dL Final  . BUN 04/16/2019 24* 8 - 23 mg/dL Final  . Creatinine 04/16/2019 1.02* 0.44 - 1.00 mg/dL Final  . Calcium 04/16/2019 9.8  8.9 - 10.3 mg/dL Final  . Total Protein 04/16/2019 7.2  6.5 - 8.1 g/dL Final  . Albumin 04/16/2019 4.4  3.5 - 5.0 g/dL Final  . AST 04/16/2019 25  15 - 41 U/L Final  . ALT 04/16/2019 18  0 - 44 U/L Final  . Alkaline Phosphatase 04/16/2019 44  38 - 126 U/L Final  . Total Bilirubin  04/16/2019 0.4  0.3 - 1.2 mg/dL Final  . GFR, Est Non Af Am 04/16/2019 55* >60 mL/min Final  . GFR, Est AFR Am 04/16/2019 >60  >60 mL/min Final  . Anion gap 04/16/2019 8  5 - 15 Final   Performed at [facility], [place] [address], [place], [place] [address]  . WBC 04/16/2019 34.2* 4.0 - 10.5 K/uL Final  . RBC 04/16/2019 4.59  3.87 - 5.11 MIL/uL Final  . Hemoglobin 04/16/2019 14.0  12.0 - 15.0 g/dL Final  . HCT 04/16/2019 42.7  36.0 - 46.0 % Final  . MCV 04/16/2019 93.0  80.0 - 100.0 fL Final  . MCH 04/16/2019 30.5  26.0 - 34.0 pg Final  . MCHC 04/16/2019 32.8  30.0 - 36.0 g/dL Final   . RDW 04/16/2019 13.7  11.5 - 15.5 % Final  . Platelets 04/16/2019 205  150 - 400 K/uL Final  . nRBC 04/16/2019 0.0  0.0 - 0.2 % Final  . Neutrophils Relative % 04/16/2019 10  % Final  . Neutro Abs 04/16/2019 3.4  1.7 - 7.7 K/uL Final  . Lymphocytes Relative 04/16/2019 87  % Final  . Lymphs Abs 04/16/2019 29.7* 0.7 - 4.0 K/uL Final  . Monocytes Relative 04/16/2019 2  % Final  . Monocytes Absolute 04/16/2019 0.7  0.1 - 1.0 K/uL Final  . Eosinophils Relative 04/16/2019 1  % Final  . Eosinophils Absolute 04/16/2019 0.2  0.0 - 0.5 K/uL Final  . Basophils Relative 04/16/2019 0  % Final  . Basophils Absolute 04/16/2019 0.2* 0.0 - 0.1 K/uL Final  . Immature Granulocytes 04/16/2019 0  % Final  . Abs Immature Granulocytes 04/16/2019 0.03  0.00 - 0.07 K/uL Final   Performed at [facility], [place] [address] [place], [place] [address]    Allergies as of 05/20/2019   No Known Allergies     Medication List       Accurate as of May 20, 2019  1:09 PM. If you have any questions, ask your nurse or doctor.        Armour Thyroid 90 MG tablet Generic drug: thyroid TAKE 1/2 A TABLET BY MOUTH DAILY What changed: See the new instructions.   b complex-C-E-zinc tablet Take 1 tablet by mouth daily.   Fish Oil 1000 MG Caps Take by mouth.   GREEN TEA EXTRACT PO Take 1 tablet by mouth.   meloxicam 15 MG tablet Commonly known as: MOBIC Take 1 tablet by mouth daily.   NON FORMULARY Take 1 capsule by mouth daily.   NON FORMULARY Take 1 Dose by mouth daily.   risedronate 150 MG tablet Commonly known as: ACTONEL PLEASE SEE ATTACHED FOR DETAILED DIRECTIONS   rosuvastatin 10 MG tablet Commonly known as: CRESTOR Take 1 tablet (10 mg total) by mouth daily.   Vitamin D3 125 MCG (5000 UT) Caps Take 5,000 Units by mouth once a week.       Allergies: No Known Allergies  Past Medical History:  Diagnosis Date  . Hyperlipidemia     Past Surgical History:   Procedure Laterality Date  . THYROIDECTOMY  1985    Family History  Problem Relation Age of Onset  . Thyroid disease Mother   . Cancer Father   . Diabetes Cousin     Social History:  reports that she has never smoked. She has never used smokeless tobacco. She reports that she does not drink alcohol or use drugs.  REVIEW Of SYSTEMS:  Weight history as follows   Wt Readings from Last 3 Encounters:  04/16/19 118 lb 9.6 oz (53.8 kg)  07/11/18 112 lb 9.6 oz (51.1 kg)  05/02/18 113 lb (51.3 kg)    Osteoporosis:  She was concerned about her bone density results and wanted to review this today   Baseline bone density done by gynecologist showed T score was -3.0     Bone density on 08/08/16 shows T scores of -2.4 at spine (improved) and -2.2 at hip, stable  04/29/2019: Bone density at spine is --1.9 Bone density at the hip is -2.4 on the left side and -2.0 on the right  She was concerned about lack of efficacy of Fosamax with her mother She was given ACTONEL in 2017 and again 2018 but stopped this because of her perception that she was having joint pains after taking this after a few prescriptions  Also concerned about possible hair loss as a side effect  Reclast was given in 5/16, she did have some nausea the next day Does not want to take Reclast again because she thinks it made her hair thin out  She was given information on Prolia in 2019 However she has not wanted to start this also  Vitamin D supplements: Taking 5000 units weekly  Vitamin D level has been consistently normal  Lab Results  Component Value Date   VD25OH 42.75 01/08/2019   VD25OH 41.79 10/29/2017   VD25OH 69.90 10/31/2016     CLL: She is being followed for asymptomatic disease and no treatment recommended as yet  Hypercholesterolemia:  Her baseline LDL was 210 and she is being treated with Crestor 10 mg  Usually eating a low-cholesterol low-fat diet  Lab Results  Component Value Date    CHOL 167 01/08/2019   HDL 64.30 01/08/2019   LDLCALC 84 01/08/2019   TRIG 92.0 01/08/2019   CHOLHDL 3 01/08/2019      Examination:   There were no vitals taken for this visit.     Assessment /Plan   OSTEOPOROSIS: No history of fracture   Her baseline T score was - 3.0  She has had treatment with Reclast and Actonel also Had stopped Actonel because she thought it was causing joint pains 2 weeks after she takes this  Now her bone density is relatively stable although T score is -2.4 at the left hip She is concerned that she is not getting better with taking calcium and vitamin D regularly and trying to do weightbearing exercises  Discussed her previous treatments and concerns about side effects from the previous medications She thinks all Reclast caused hair loss and Actonel caused bone pain  Today discussed that since she has borderline osteoporosis and is likely at high fracture risk for hip she should be on a pharmacological treatment to prevent progression and reduce fracture risk Since she is not keen on trying Actonel again she was given the option of doing Evista Discussed with her actions of lovastatin, benefits and possible side effects and she will consider this  She will wait till December to decide on treatment when she comes back for follow-up for other problems  Total encounter with telephone call =7 minutes   There are no Patient Instructions on file for this visit.   Elayne Snare 05/20/2019, 1:09 PM

## 2019-06-24 DIAGNOSIS — C9110 Chronic lymphocytic leukemia of B-cell type not having achieved remission: Secondary | ICD-10-CM | POA: Diagnosis not present

## 2019-07-03 DIAGNOSIS — H40053 Ocular hypertension, bilateral: Secondary | ICD-10-CM | POA: Diagnosis not present

## 2019-07-03 DIAGNOSIS — H524 Presbyopia: Secondary | ICD-10-CM | POA: Diagnosis not present

## 2019-07-07 ENCOUNTER — Other Ambulatory Visit: Payer: Self-pay

## 2019-07-07 ENCOUNTER — Other Ambulatory Visit (INDEPENDENT_AMBULATORY_CARE_PROVIDER_SITE_OTHER): Payer: Medicare HMO

## 2019-07-07 DIAGNOSIS — E559 Vitamin D deficiency, unspecified: Secondary | ICD-10-CM

## 2019-07-07 DIAGNOSIS — M810 Age-related osteoporosis without current pathological fracture: Secondary | ICD-10-CM | POA: Diagnosis not present

## 2019-07-07 DIAGNOSIS — E890 Postprocedural hypothyroidism: Secondary | ICD-10-CM | POA: Diagnosis not present

## 2019-07-07 DIAGNOSIS — E7800 Pure hypercholesterolemia, unspecified: Secondary | ICD-10-CM

## 2019-07-07 LAB — CBC
HCT: 41.3 % (ref 36.0–46.0)
Hemoglobin: 13.4 g/dL (ref 12.0–15.0)
MCHC: 32.5 g/dL (ref 30.0–36.0)
MCV: 92 fl (ref 78.0–100.0)
Platelets: 261 10*3/uL (ref 150.0–400.0)
RBC: 4.49 Mil/uL (ref 3.87–5.11)
RDW: 13.8 % (ref 11.5–15.5)
WBC: 33.2 10*3/uL (ref 4.0–10.5)

## 2019-07-07 LAB — COMPREHENSIVE METABOLIC PANEL
ALT: 19 U/L (ref 0–35)
AST: 24 U/L (ref 0–37)
Albumin: 4.5 g/dL (ref 3.5–5.2)
Alkaline Phosphatase: 39 U/L (ref 39–117)
BUN: 24 mg/dL — ABNORMAL HIGH (ref 6–23)
CO2: 29 mEq/L (ref 19–32)
Calcium: 10.1 mg/dL (ref 8.4–10.5)
Chloride: 101 mEq/L (ref 96–112)
Creatinine, Ser: 0.93 mg/dL (ref 0.40–1.20)
GFR: 59.05 mL/min — ABNORMAL LOW (ref >60.00)
Glucose, Bld: 120 mg/dL — ABNORMAL HIGH (ref 70–99)
Potassium: 4 mEq/L (ref 3.5–5.1)
Sodium: 137 mEq/L (ref 135–145)
Total Bilirubin: 0.3 mg/dL (ref 0.2–1.2)
Total Protein: 7.2 g/dL (ref 6.0–8.3)

## 2019-07-07 LAB — LIPID PANEL
Cholesterol: 165 mg/dL (ref 0–200)
HDL: 64.8 mg/dL
LDL Cholesterol: 78 mg/dL (ref 0–99)
NonHDL: 100.69
Total CHOL/HDL Ratio: 3
Triglycerides: 114 mg/dL (ref 0.0–149.0)
VLDL: 22.8 mg/dL (ref 0.0–40.0)

## 2019-07-07 LAB — T4, FREE: Free T4: 0.7 ng/dL (ref 0.60–1.60)

## 2019-07-07 LAB — TSH: TSH: 3.17 u[IU]/mL (ref 0.35–4.50)

## 2019-07-07 LAB — VITAMIN D 25 HYDROXY (VIT D DEFICIENCY, FRACTURES): VITD: 33.96 ng/mL (ref 30.00–100.00)

## 2019-07-07 LAB — T3, FREE: T3, Free: 2.3 pg/mL (ref 2.3–4.2)

## 2019-07-09 ENCOUNTER — Ambulatory Visit: Payer: Medicare HMO | Admitting: Endocrinology

## 2019-07-09 ENCOUNTER — Other Ambulatory Visit: Payer: Self-pay | Admitting: Endocrinology

## 2019-07-09 NOTE — Telephone Encounter (Signed)
Does pt's dose need to change based on recent blood work?

## 2019-07-09 NOTE — Telephone Encounter (Signed)
Will review tomorrow

## 2019-07-10 ENCOUNTER — Ambulatory Visit (INDEPENDENT_AMBULATORY_CARE_PROVIDER_SITE_OTHER): Payer: Medicare HMO | Admitting: Endocrinology

## 2019-07-10 ENCOUNTER — Other Ambulatory Visit: Payer: Self-pay

## 2019-07-10 ENCOUNTER — Encounter: Payer: Self-pay | Admitting: Endocrinology

## 2019-07-10 DIAGNOSIS — M810 Age-related osteoporosis without current pathological fracture: Secondary | ICD-10-CM | POA: Diagnosis not present

## 2019-07-10 DIAGNOSIS — E890 Postprocedural hypothyroidism: Secondary | ICD-10-CM | POA: Diagnosis not present

## 2019-07-10 DIAGNOSIS — E559 Vitamin D deficiency, unspecified: Secondary | ICD-10-CM

## 2019-07-10 DIAGNOSIS — E7800 Pure hypercholesterolemia, unspecified: Secondary | ICD-10-CM

## 2019-07-10 MED ORDER — RALOXIFENE HCL 60 MG PO TABS: 60.0000 mg | ORAL_TABLET | Freq: Every day | ORAL | 2 refills | Status: DC

## 2019-07-10 NOTE — Progress Notes (Signed)
Patient ID: Samantha Graham, female   DOB: 1945-12-08, 73 y.o.   MRN: [id number]  Today's office visit was provided via telemedicine using a phone call method The patient was explained the limitations of evaluation and management by telemedicine and the availability of in person appointments.  The patient understood the limitations and agreed to proceed. Patient also understood that the telehealth visit is billable. . Location of the patient: Patient's home . Location of the provider: Physician office Only the patient and myself were participating in the encounter    Reason for Appointment: Osteoporosis, followup visit   History of Present Illness:    HYPOTHYROID: This was first diagnosed in 1985  She has history of hyperthyroidism and thyroid nodule treated with thyroidectomy and ? Radioactive iodine but detailed records are not available of this According to her records since about 2001 had been taking 88 mcg brand name Synthroid  Since about 12/14 her TSH had been relatively low and her dose was reduced gradually to a final dose of 62.5 g She however was having complaints of extreme fatigue, brain fog and wanted to increase her dose to feel better  Because of her fatigue she was empirically started on Armour Thyroid in 04/2014 with 45 g. She did feel less fatigued with this along with less hair loss initially  Since her TSH was low normal previously with the daily dose she was told to skip the dose on Sundays She has been taking the Armour Thyroid daily in the morning before breakfast She takes her calcium/vitamin D separately  She thinks she is more tired recently No cold intolerance Weight is about the same  Her TSH is normal although trending gradually higher since last year Currently T3 level is slightly lower than before  Labs as follows   Lab Results  Component Value Date   TSH 3.17 07/07/2019   TSH 2.58 01/08/2019   TSH 1.85 05/02/2018   FREET4  0.70 07/07/2019   FREET4 0.77 01/08/2019   FREET4 0.71 05/02/2018   Free T3 is 2.3   OTHER active problems addressed today: See review of systems   Lab on 07/07/2019  Component Date Value Ref Range Status  . VITD 07/07/2019 33.96  30.00 - 100.00 ng/mL Final  . WBC 07/07/2019 33.2 Repeated and verified X2.* 4.0 - 10.5 K/uL Final  . RBC 07/07/2019 4.49  3.87 - 5.11 Mil/uL Final  . Platelets 07/07/2019 261.0  150.0 - 400.0 K/uL Final  . Hemoglobin 07/07/2019 13.4  12.0 - 15.0 g/dL Final  . HCT 07/07/2019 41.3  36.0 - 46.0 % Final  . MCV 07/07/2019 92.0  78.0 - 100.0 fl Final  . MCHC 07/07/2019 32.5  30.0 - 36.0 g/dL Final  . RDW 07/07/2019 13.8  11.5 - 15.5 % Final  . T3, Free 07/07/2019 2.3  2.3 - 4.2 pg/mL Final  . Free T4 07/07/2019 0.70  0.60 - 1.60 ng/dL Final   Comment: Specimens from patients who are undergoing biotin therapy and /or ingesting biotin supplements may contain high levels of biotin.  The higher biotin concentration in these specimens interferes with this Free T4 assay.  Specimens that contain high levels  of biotin may cause false high results for this Free T4 assay.  Please interpret results in light of the total clinical presentation of the patient.    [name] TSH 07/07/2019 3.17  0.35 - 4.50 uIU/mL Final  . Cholesterol 07/07/2019 165  0 - 200 mg/dL Final   ATP III Classification  Desirable:  < 200 mg/dL               Borderline High:  200 - 239 mg/dL          High:  > = 240 mg/dL  . Triglycerides 07/07/2019 114.0  0.0 - 149.0 mg/dL Final   Normal:  <150 mg/dLBorderline High:  150 - 199 mg/dL  . HDL 07/07/2019 64.80  >39.00 mg/dL Final  . VLDL 07/07/2019 22.8  0.0 - 40.0 mg/dL Final  . LDL Cholesterol 07/07/2019 78  0 - 99 mg/dL Final  . Total CHOL/HDL Ratio 07/07/2019 3   Final                  Men          Women1/2 Average Risk     3.4          3.3Average Risk          5.0          4.42X Average Risk          9.6          7.13X Average Risk          15.0           11.0                      . NonHDL 07/07/2019 100.69   Final   NOTE:  Non-HDL goal should be 30 mg/dL higher than patient's LDL goal (i.e. LDL goal of < 70 mg/dL, would have non-HDL goal of < 100 mg/dL)  . Sodium 07/07/2019 137  135 - 145 mEq/L Final  . Potassium 07/07/2019 4.0  3.5 - 5.1 mEq/L Final  . Chloride 07/07/2019 101  96 - 112 mEq/L Final  . CO2 07/07/2019 29  19 - 32 mEq/L Final  . Glucose, Bld 07/07/2019 120* 70 - 99 mg/dL Final  . BUN 07/07/2019 24* 6 - 23 mg/dL Final  . Creatinine, Ser 07/07/2019 0.93  0.40 - 1.20 mg/dL Final  . Total Bilirubin 07/07/2019 0.3  0.2 - 1.2 mg/dL Final  . Alkaline Phosphatase 07/07/2019 39  39 - 117 U/L Final  . AST 07/07/2019 24  0 - 37 U/L Final  . ALT 07/07/2019 19  0 - 35 U/L Final  . Total Protein 07/07/2019 7.2  6.0 - 8.3 g/dL Final  . Albumin 07/07/2019 4.5  3.5 - 5.2 g/dL Final  . GFR 07/07/2019 59.05* >60.00 mL/min Final  . Calcium 07/07/2019 10.1  8.4 - 10.5 mg/dL Final    Allergies as of 07/10/2019   No Known Allergies     Medication List       Accurate as of July 10, 2019  4:25 PM. If you have any questions, ask your nurse or doctor.        b complex-C-E-zinc tablet Take 1 tablet by mouth daily.   Fish Oil 1000 MG Caps Take by mouth.   GREEN TEA EXTRACT PO Take 1 tablet by mouth.   meloxicam 15 MG tablet Commonly known as: MOBIC Take 1 tablet by mouth daily.   NON FORMULARY Take 1 capsule by mouth daily.   NON FORMULARY Take 1 Dose by mouth daily.   risedronate 150 MG tablet Commonly known as: ACTONEL PLEASE SEE ATTACHED FOR DETAILED DIRECTIONS   rosuvastatin 10 MG tablet Commonly known as: CRESTOR TAKE 1 TABLET BY MOUTH EVERY DAY   thyroid 90 MG tablet Commonly known as: Armour Thyroid  TAKE 1/2 A TABLET BY MOUTH DAILY, not on sunday   Vitamin D3 125 MCG (5000 UT) Caps Take 5,000 Units by mouth once a week.       Allergies: No Known Allergies  Past Medical History:  Diagnosis Date  .  Hyperlipidemia     Past Surgical History:  Procedure Laterality Date  . THYROIDECTOMY  1985    Family History  Problem Relation Age of Onset  . Thyroid disease Mother   . Cancer Father   . Diabetes Cousin     Social History:  reports that she has never smoked. She has never used smokeless tobacco. She reports that she does not drink alcohol or use drugs.  REVIEW Of SYSTEMS:  Weight history as follows   Wt Readings from Last 3 Encounters:  04/16/19 118 lb 9.6 oz (53.8 kg)  07/11/18 112 lb 9.6 oz (51.1 kg)  05/02/18 113 lb (51.3 kg)    Osteoporosis:   Baseline bone density done by gynecologist showed T score was -3.0     Bone density on 08/08/16 shows T scores of -2.4 at spine (improved) and -2.2 at hip, stable  Report from 04/29/2019:  Bone density at spine is --1.9 Bone density at the hip is -2.4 on the left side and -2.0 on the right  She did not want to take Fosamax because it did not work with her mother apparently She was given ACTONEL in 2017 and again 2018 but stopped this because of her perception that she was having joint pains after taking this after a few prescriptions  Also concerned about possible hair loss as a side effect  Reclast was given in 5/16, she did have some nausea the next day Does not want to take Reclast again because she thinks it made her hair thin out  She was given information on Prolia in 2019 However she has not wanted to start this also for fear of side effects Evista was discussed in October but she still is unclear about whether she wants to take this  Vitamin D supplements: Taking 5000 units weekly  Vitamin D level has been consistently normal  Lab Results  Component Value Date   VD25OH 33.96 07/07/2019   VD25OH 42.75 01/08/2019   VD25OH 41.79 10/29/2017     CLL: She is being followed for asymptomatic disease and no treatment recommended WBC is about the same  Hypercholesterolemia:  Her baseline LDL was 210 and she  is being treated with Crestor 10 mg  Usually following a low-cholesterol low-fat diet Lipids are about the same as before  Lab Results  Component Value Date   CHOL 165 07/07/2019   CHOL 167 01/08/2019   CHOL 168 05/02/2018   Lab Results  Component Value Date   HDL 64.80 07/07/2019   HDL 64.30 01/08/2019   HDL 64.90 05/02/2018   Lab Results  Component Value Date   LDLCALC 78 07/07/2019   LDLCALC 84 01/08/2019   [place] 88 05/02/2018   Lab Results  Component Value Date   TRIG 114.0 07/07/2019   TRIG 92.0 01/08/2019   TRIG 73.0 05/02/2018   Lab Results  Component Value Date   CHOLHDL 3 07/07/2019   CHOLHDL 3 01/08/2019   CHOLHDL 3 05/02/2018   No results found for: LDLDIRECT   She is asking about occasional symptoms of shakiness in between meals relieved by food and whether she has diabetes Nonfasting glucose was 120    Examination:   There were no vitals taken for  this visit.     Assessment /Plan   OSTEOPOROSIS: No history of fracture   Her baseline T score was - 3.0  She has had treatment with Reclast once and Actonel for less than 2 years Had stopped Actonel because she thought it was causing joint pains 2 weeks after she takes this  Last bone density is relatively stable although T score is -2.4 at the left hip She is concerned that she is not getting better and wants to start some treatment However she has refused Reclast and has not decided about Evista  Benefits and possible side effects of Evista again discussed in detail and she agrees to start this She does not have a previous history of DVT and should be able to take this, may try this every other day to reduce possibility of hot flashes Also because of her vitamin D level being low normal she can increase her vitamin D 5000 units to twice a week  Lipids: Well-controlled.  Discussed that she should continue her Crestor to maintain the level of control and not reduce the dose  HYPOTHYROIDISM:  Although her TSH is normal she is complaining of increasing fatigue and this may be related to her T3 level being low normal along with high normal TSH She can now start taking her Armour Thyroid 7 days a week instead of 6 days a week  Nonhypoglycemic reactive symptoms: Discussed that this may be related to periodically having high carbohydrate meals or going too long without eating and she should have protein with every meal  Total encounter with telephone call =11 minutes   There are no Patient Instructions on file for this visit.   Elayne Snare 07/10/2019, 4:25 PM

## 2019-07-31 DIAGNOSIS — R69 Illness, unspecified: Secondary | ICD-10-CM | POA: Diagnosis not present

## 2019-08-18 ENCOUNTER — Ambulatory Visit: Payer: Medicare HMO | Attending: Internal Medicine

## 2019-08-18 DIAGNOSIS — Z23 Encounter for immunization: Secondary | ICD-10-CM

## 2019-08-18 NOTE — Progress Notes (Signed)
   Covid-19 Vaccination Clinic  Name:  Crispina Sheffield    MRN: [id number] DOB: 1945/09/02  08/18/2019  Ms. Neenan was observed post Covid-19 immunization for 15 minutes without incidence. She was provided with Vaccine Information Sheet and instruction to access the V-Safe system.   Ms. Bardin was instructed to call 911 with any severe reactions post vaccine: [name] Difficulty breathing  . Swelling of your face and throat  . A fast heartbeat  . A bad rash all over your body  . Dizziness and weakness    Immunizations Administered    Name Date Dose VIS Date Route   Pfizer COVID-19 Vaccine 08/18/2019 12:39 PM 0.3 mL 07/04/2019 Intramuscular   Manufacturer: [place]   Lot: [id number]   [place]: [id number]

## 2019-08-20 DIAGNOSIS — H43811 Vitreous degeneration, right eye: Secondary | ICD-10-CM | POA: Diagnosis not present

## 2019-08-20 DIAGNOSIS — H40003 Preglaucoma, unspecified, bilateral: Secondary | ICD-10-CM | POA: Diagnosis not present

## 2019-08-20 DIAGNOSIS — H3540 Unspecified peripheral retinal degeneration: Secondary | ICD-10-CM | POA: Diagnosis not present

## 2019-08-20 DIAGNOSIS — H527 Unspecified disorder of refraction: Secondary | ICD-10-CM | POA: Diagnosis not present

## 2019-08-20 DIAGNOSIS — H25813 Combined forms of age-related cataract, bilateral: Secondary | ICD-10-CM | POA: Diagnosis not present

## 2019-08-20 DIAGNOSIS — H52203 Unspecified astigmatism, bilateral: Secondary | ICD-10-CM | POA: Diagnosis not present

## 2019-09-01 DIAGNOSIS — Z01818 Encounter for other preprocedural examination: Secondary | ICD-10-CM | POA: Diagnosis not present

## 2019-09-04 DIAGNOSIS — H40003 Preglaucoma, unspecified, bilateral: Secondary | ICD-10-CM | POA: Diagnosis not present

## 2019-09-04 DIAGNOSIS — H52203 Unspecified astigmatism, bilateral: Secondary | ICD-10-CM | POA: Diagnosis not present

## 2019-09-04 DIAGNOSIS — E039 Hypothyroidism, unspecified: Secondary | ICD-10-CM | POA: Diagnosis not present

## 2019-09-04 DIAGNOSIS — E785 Hyperlipidemia, unspecified: Secondary | ICD-10-CM | POA: Diagnosis not present

## 2019-09-04 DIAGNOSIS — H25813 Combined forms of age-related cataract, bilateral: Secondary | ICD-10-CM | POA: Diagnosis not present

## 2019-09-04 DIAGNOSIS — Z79899 Other long term (current) drug therapy: Secondary | ICD-10-CM | POA: Diagnosis not present

## 2019-09-04 DIAGNOSIS — H527 Unspecified disorder of refraction: Secondary | ICD-10-CM | POA: Diagnosis not present

## 2019-09-04 DIAGNOSIS — H25812 Combined forms of age-related cataract, left eye: Secondary | ICD-10-CM | POA: Diagnosis not present

## 2019-09-08 ENCOUNTER — Ambulatory Visit: Payer: Medicare HMO | Attending: Internal Medicine

## 2019-09-08 DIAGNOSIS — Z23 Encounter for immunization: Secondary | ICD-10-CM | POA: Insufficient documentation

## 2019-09-08 NOTE — Progress Notes (Signed)
   Covid-19 Vaccination Clinic  Name:  Samantha Graham    MRN: [id number] DOB: [birth year]  09/08/2019  Samantha Graham was observed post Covid-19 immunization for 15 minutes without incidence. She was provided with Vaccine Information Sheet and instruction to access the V-Safe system.   Samantha Graham was instructed to call 911 with any severe reactions post vaccine: [name] Difficulty breathing  . Swelling of your face and throat  . A fast heartbeat  . A bad rash all over your body  . Dizziness and weakness    Immunizations Administered    Name Date Dose VIS Date Route   Pfizer COVID-19 Vaccine 09/08/2019 11:40 AM 0.3 mL 07/04/2019 Intramuscular   Manufacturer: [place]   Lot: [id number]   [place]: [id number]

## 2019-09-09 DIAGNOSIS — Z01818 Encounter for other preprocedural examination: Secondary | ICD-10-CM | POA: Diagnosis not present

## 2019-09-11 DIAGNOSIS — Z79899 Other long term (current) drug therapy: Secondary | ICD-10-CM | POA: Diagnosis not present

## 2019-09-11 DIAGNOSIS — C9110 Chronic lymphocytic leukemia of B-cell type not having achieved remission: Secondary | ICD-10-CM | POA: Diagnosis not present

## 2019-09-11 DIAGNOSIS — H43811 Vitreous degeneration, right eye: Secondary | ICD-10-CM | POA: Diagnosis not present

## 2019-09-11 DIAGNOSIS — H527 Unspecified disorder of refraction: Secondary | ICD-10-CM | POA: Diagnosis not present

## 2019-09-11 DIAGNOSIS — E039 Hypothyroidism, unspecified: Secondary | ICD-10-CM | POA: Diagnosis not present

## 2019-09-11 DIAGNOSIS — E785 Hyperlipidemia, unspecified: Secondary | ICD-10-CM | POA: Diagnosis not present

## 2019-09-11 DIAGNOSIS — H3540 Unspecified peripheral retinal degeneration: Secondary | ICD-10-CM | POA: Diagnosis not present

## 2019-09-11 DIAGNOSIS — H52203 Unspecified astigmatism, bilateral: Secondary | ICD-10-CM | POA: Diagnosis not present

## 2019-09-11 DIAGNOSIS — H40003 Preglaucoma, unspecified, bilateral: Secondary | ICD-10-CM | POA: Diagnosis not present

## 2019-09-11 DIAGNOSIS — H25811 Combined forms of age-related cataract, right eye: Secondary | ICD-10-CM | POA: Diagnosis not present

## 2019-09-11 DIAGNOSIS — H25813 Combined forms of age-related cataract, bilateral: Secondary | ICD-10-CM | POA: Diagnosis not present

## 2019-10-02 ENCOUNTER — Other Ambulatory Visit: Payer: Self-pay | Admitting: Endocrinology

## 2019-10-08 DIAGNOSIS — L821 Other seborrheic keratosis: Secondary | ICD-10-CM | POA: Diagnosis not present

## 2019-10-08 DIAGNOSIS — D485 Neoplasm of uncertain behavior of skin: Secondary | ICD-10-CM | POA: Diagnosis not present

## 2019-10-08 DIAGNOSIS — L570 Actinic keratosis: Secondary | ICD-10-CM | POA: Diagnosis not present

## 2019-10-08 DIAGNOSIS — D2261 Melanocytic nevi of right upper limb, including shoulder: Secondary | ICD-10-CM | POA: Diagnosis not present

## 2019-10-08 DIAGNOSIS — C44311 Basal cell carcinoma of skin of nose: Secondary | ICD-10-CM | POA: Diagnosis not present

## 2019-10-13 NOTE — Progress Notes (Signed)
HEMATOLOGY/ONCOLOGY CLINIC NOTE  Date of Service: 10/14/19    Patient Care Team: Garth Bigness as PCP - General (Physician Assistant)  CHIEF COMPLAINTS/PURPOSE OF CONSULTATION:  F/u for CLL  HISTORY OF PRESENTING ILLNESS:   Samantha Graham is a wonderful 74 y.o. female who has been referred to [place] by Dr. Joya Gaskins, Faylene Million, PA-C for evaluation and management of incidentally noted leucocytosis/Lymphocytosis.  Patient is generally in good overall health and on labs done for her annual physical in 06/2017 on labs was incidentally noted to have leucocytosis of 21.7k with lymphocytosis of 17.7k. Nl hgb of 13.8 and nl platelet count of 200k. She notes no new fatigue, fevers/chills/night sweats, overt LN enlargement, abdominal pain or distension. No recent viral infection. It is unclear if she has been noted to have lymphocytosis prior to these labs (old labs not available to [place] currently).  She was referred to [place] for further evaluation of her lymphocytosis.  She notes she feels no differently than has has felt over the last few years.  Patient notes that she is a non smoker. No other recent chemical exposures. No PRBC transfusions in the past.  Interval History:  Samantha Paschall is a wonderful 74 y.o. female who is following up today regarding her Chronic Lymphocytic Leukemia. The patient's last visit with [place] was on 04/16/19. The pt reports that she is doing well overall.  The pt reports she is doing good. Rocephin has been giving her hot flashes. She also had cataract surgery about 3 weeks ago. Pt goes to the gym for everyday for stretching and yoga. She had gotten both doses of the COVID19 vaccine. Pt had a bone density test in October.   Lab results today (10/14/19) of CBC w/diff and CMP is as follows: all values are WNL except for WBC at 37.1K, Lymphs Abs at 32.4K, Basophils Abs at 0.2K. 10/14/19 of LDH at 133  On review of systems, pt denies fever, chills, night  sweats, abdominal pain, and any other symptoms.    MEDICAL HISTORY:  Past Medical History:  Diagnosis Date  . Hyperlipidemia   Hypothyroidism  Rt frozen shoulder Osteopenia/osteoporosis  SURGICAL HISTORY: Past Surgical History:  Procedure Laterality Date  . THYROIDECTOMY  1985  hysterectomy with BSO due to prolpased uterus Bunionectomy Meniscal injury -rt knee Bladder cyst removal  SOCIAL HISTORY: Social History   Socioeconomic History  . Marital status: Married    Spouse name: Not on file  . Number of children: Not on file  . Years of education: Not on file  . Highest education level: Not on file  Occupational History  . Not on file  Tobacco Use  . Smoking status: Never Smoker  . Smokeless tobacco: Never Used  Substance and Sexual Activity  . Alcohol use: No  . Drug use: No  . Sexual activity: Not on file  Other Topics Concern  . Not on file  Social History Narrative  . Not on file   Social Determinants of Health   Financial Resource Strain:   . Difficulty of Paying Living Expenses:   Food Insecurity:   . Worried About Charity fundraiser in the Last Year:   . Arboriculturist in the Last Year:   Transportation Needs:   . Film/video editor (Medical):   [name] Lack of Transportation (Non-Medical):   Physical Activity:   . Days of Exercise per Week:   . Minutes of Exercise per Session:   Stress:   .  Feeling of Stress :   Social Connections:   . Frequency of Communication with Friends and Family:   . Frequency of Social Gatherings with Friends and Family:   . Attends Religious Services:   . Active Member of Clubs or Organizations:   . Attends Archivist Meetings:   [name] Marital Status:   Intimate Partner Violence:   . Fear of Current or Ex-Partner:   . Emotionally Abused:   [name] Physically Abused:   . Sexually Abused:     FAMILY HISTORY: Family History  Problem Relation Age of Onset  . Thyroid disease Mother   . Cancer Father   . Diabetes  Cousin     ALLERGIES:  has No Known Allergies.  MEDICATIONS:  Current Outpatient Medications  Medication Sig Dispense Refill  . B Complex-C-E-Zn (B COMPLEX-C-E-ZINC) tablet Take 1 tablet by mouth daily.    . Cholecalciferol (VITAMIN D3) 5000 UNITS CAPS Take 5,000 Units by mouth once a week.     Nyoka Cowden Tea, Camellia sinensis, (GREEN TEA EXTRACT PO) Take 1 tablet by mouth.    . NON FORMULARY Take 1 capsule by mouth daily.     . NON FORMULARY Take 1 Dose by mouth daily.     . Omega-3 Fatty Acids (FISH OIL) 1000 MG CAPS Take by mouth.    . raloxifene (EVISTA) 60 MG tablet TAKE 1 TABLET BY MOUTH EVERY DAY 90 tablet 1  . rosuvastatin (CRESTOR) 10 MG tablet TAKE 1 TABLET BY MOUTH EVERY DAY 90 tablet 1  . thyroid (ARMOUR THYROID) 90 MG tablet TAKE 1/2 A TABLET BY MOUTH DAILY, not on sunday (Patient taking differently: TAKE 1/2 A TABLET BY MOUTH DAILY) 45 tablet 1   No current facility-administered medications for this visit.    REVIEW OF SYSTEMS:    A 10+ POINT REVIEW OF SYSTEMS WAS OBTAINED including neurology, dermatology, psychiatry, cardiac, respiratory, lymph, extremities, GI, GU, Musculoskeletal, constitutional, breasts, reproductive, HEENT.  All pertinent positives are noted in the HPI.  All others are negative.   PHYSICAL EXAMINATION: ECOG PERFORMANCE STATUS: 0 - Asymptomatic  Vitals:   10/14/19 1205  BP: 101/60  Pulse: 60  Resp: 18  Temp: 97.8 F (36.6 C)  SpO2: 99%   Filed Weights   10/14/19 1205  Weight: 117 lb 4.8 oz (53.2 kg)   .Body mass index is 21.45 kg/m.   GENERAL:alert, in no acute distress and comfortable SKIN: no acute rashes, no significant lesions EYES: conjunctiva are pink and non-injected, sclera anicteric OROPHARYNX: MMM, no exudates, no oropharyngeal erythema or ulceration NECK: supple, no JVD LYMPH:  no palpable lymphadenopathy in the cervical, axillary or inguinal regions LUNGS: clear to auscultation b/l with normal respiratory effort HEART:  regular rate & rhythm ABDOMEN:  normoactive bowel sounds , non tender, not distended. Extremity: no pedal edema PSYCH: alert & oriented x 3 with fluent speech NEURO: no focal motor/sensory deficits  LABORATORY DATA:  I have reviewed the data as listed  . CBC Latest Ref Rng & Units 10/14/2019 07/07/2019 04/16/2019  WBC 4.0 - 10.5 K/uL 37.1(H) 33.2 Repeated and verified X2.([place]) 34.2(H)  Hemoglobin 12.0 - 15.0 g/dL 13.2 13.4 14.0  Hematocrit 36.0 - 46.0 % 41.0 41.3 42.7  Platelets 150 - 400 K/uL 178 261.0 205   . CBC    Component Value Date/Time   WBC 37.1 (H) 10/14/2019 1147   RBC 4.35 10/14/2019 1147   HGB 13.2 10/14/2019 1147   HGB 13.7 12/13/2017 1336   HCT 41.0 10/14/2019  1147   PLT 178 10/14/2019 1147   PLT 197 12/13/2017 1336   MCV 94.3 10/14/2019 1147   MCH 30.3 10/14/2019 1147   MCHC 32.2 10/14/2019 1147   RDW 14.6 10/14/2019 1147   LYMPHSABS 32.4 (H) 10/14/2019 1147   MONOABS 0.7 10/14/2019 1147   EOSABS 0.2 10/14/2019 1147   BASOSABS 0.2 (H) 10/14/2019 1147     . CMP Latest Ref Rng & Units 10/14/2019 07/07/2019 04/16/2019  Glucose 70 - 99 mg/dL 98 120(H) 96  BUN 8 - 23 mg/dL 16 24(H) 24(H)  Creatinine 0.44 - 1.00 mg/dL 0.85 0.93 1.02(H)  Sodium 135 - 145 mmol/L 140 137 138  Potassium 3.5 - 5.1 mmol/L 4.2 4.0 4.6  Chloride 98 - 111 mmol/L 103 101 102  CO2 22 - 32 mmol/L 27 29 28   Calcium 8.9 - 10.3 mg/dL 9.2 10.1 9.8  Total Protein 6.5 - 8.1 g/dL 6.6 7.2 7.2  Total Bilirubin 0.3 - 1.2 mg/dL 0.3 0.3 0.4  Alkaline Phos 38 - 126 U/L 39 39 44  AST 15 - 41 U/L 24 24 25   ALT 0 - 44 U/L 18 19 18      Component     Latest Ref Rng & Units 08/03/2017  Kappa free light chain     3.3 - 19.4 mg/L 15.0  Lamda free light chains     5.7 - 26.3 mg/L 13.0  Kappa, lamda light chain ratio     0.26 - 1.65 1.15  LDH     125 - 245 U/L 173       FISH:     RADIOGRAPHIC STUDIES:  I have personally reviewed the radiological images as listed and agreed with the  findings in the report. No results found.  ASSESSMENT & PLAN:   74 y.o. 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 No constitutional symptoms. Newly noted - incidentally on annual labs. No older labs available for reference.  FISH Prognostic panel revealed a monoallelic 0000000 deletion which  might suggest a more indolent course for her CLL  11/19/17 CT C/A/P revealed no changes in spleen size and no overtly pathologic adenopathy is identified. One upper normal sized lymph node is a 9 mm in short axis left axillary lymph node.   . Lab Results  Component Value Date   LDH 133 10/14/2019   PLAN: -Discussed pt labwork today, 10/14/19; of CBC w/diff and CMP is as follows: all values are WNL except for WBC at 37.1K, Lymphs Abs at 32.4K, Basophils Abs at 0.2K. -Discussed 10/14/19 of LDH at 133 -Advised WBC showing gradually increasing lymphocytosis as a part of the natural hx of her CLL  -The pt shows no significant clinical or lab progression of her CLL at this time.  -No indication for initiating active treatment at this time.   -F/u with endocrinologist for mx of osteoporosis -Recommends staying physical activity and continue to drink water  -Continue watching- Will see back in 6 months    .3) . Patient Active Problem List   Diagnosis Date Noted  . Hypercholesterolemia 05/02/2018  . Age related osteoporosis 08/04/2016  . 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   FOLLOW UP: RTC with Dr Irene Limbo with labs in 6 months   The total time spent in the appt was 20 minutes and more than 50% was on counseling and direct patient cares.  All of the patient's questions were answered  with apparent satisfaction. The patient knows to call the clinic with any problems, questions or concerns.    Sullivan Lone MD MS AAHIVMS [facility]  Hematology/Oncology Physician [facility]  (Office):       [phone] (Work cell):  [phone] (Fax):           [phone]  10/14/2019 1:41 PM  I, Dawayne Cirri am acting as a Education administrator for Dr. Sullivan Lone.   .I have reviewed the above documentation for accuracy and completeness, and I agree with the above. Brunetta Genera MD

## 2019-10-14 ENCOUNTER — Other Ambulatory Visit: Payer: Self-pay

## 2019-10-14 ENCOUNTER — Inpatient Hospital Stay: Payer: Medicare HMO | Attending: Hematology

## 2019-10-14 ENCOUNTER — Inpatient Hospital Stay: Payer: Medicare HMO | Admitting: Hematology

## 2019-10-14 VITALS — BP 101/60 | HR 60 | Temp 97.8°F | Resp 18 | Ht 62.0 in | Wt 117.3 lb

## 2019-10-14 DIAGNOSIS — C9110 Chronic lymphocytic leukemia of B-cell type not having achieved remission: Secondary | ICD-10-CM | POA: Insufficient documentation

## 2019-10-14 DIAGNOSIS — M810 Age-related osteoporosis without current pathological fracture: Secondary | ICD-10-CM | POA: Diagnosis not present

## 2019-10-14 LAB — CBC WITH DIFFERENTIAL/PLATELET
Abs Immature Granulocytes: 0.05 10*3/uL (ref 0.00–0.07)
Basophils Absolute: 0.2 10*3/uL — ABNORMAL HIGH (ref 0.0–0.1)
Basophils Relative: 1 %
Eosinophils Absolute: 0.2 10*3/uL (ref 0.0–0.5)
Eosinophils Relative: 1 %
HCT: 41 % (ref 36.0–46.0)
Hemoglobin: 13.2 g/dL (ref 12.0–15.0)
Immature Granulocytes: 0 %
Lymphocytes Relative: 86 %
Lymphs Abs: 32.4 10*3/uL — ABNORMAL HIGH (ref 0.7–4.0)
MCH: 30.3 pg (ref 26.0–34.0)
MCHC: 32.2 g/dL (ref 30.0–36.0)
MCV: 94.3 fL (ref 80.0–100.0)
Monocytes Absolute: 0.7 10*3/uL (ref 0.1–1.0)
Monocytes Relative: 2 %
Neutro Abs: 3.6 10*3/uL (ref 1.7–7.7)
Neutrophils Relative %: 10 %
Platelets: 178 10*3/uL (ref 150–400)
RBC: 4.35 MIL/uL (ref 3.87–5.11)
RDW: 14.6 % (ref 11.5–15.5)
WBC: 37.1 10*3/uL — ABNORMAL HIGH (ref 4.0–10.5)
nRBC: 0 % (ref 0.0–0.2)

## 2019-10-14 LAB — CMP (CANCER CENTER ONLY)
ALT: 18 U/L (ref 0–44)
AST: 24 U/L (ref 15–41)
Albumin: 3.8 g/dL (ref 3.5–5.0)
Alkaline Phosphatase: 39 U/L (ref 38–126)
Anion gap: 10 (ref 5–15)
BUN: 16 mg/dL (ref 8–23)
CO2: 27 mmol/L (ref 22–32)
Calcium: 9.2 mg/dL (ref 8.9–10.3)
Chloride: 103 mmol/L (ref 98–111)
Creatinine: 0.85 mg/dL (ref 0.44–1.00)
GFR, Est AFR Am: >60 mL/min (ref >60)
GFR, Estimated: >60 mL/min (ref >60)
Glucose, Bld: 98 mg/dL (ref 70–99)
Potassium: 4.2 mmol/L (ref 3.5–5.1)
Sodium: 140 mmol/L (ref 135–145)
Total Bilirubin: 0.3 mg/dL (ref 0.3–1.2)
Total Protein: 6.6 g/dL (ref 6.5–8.1)

## 2019-10-14 LAB — LACTATE DEHYDROGENASE: LDH: 133 U/L (ref 98–192)

## 2019-10-17 ENCOUNTER — Encounter: Payer: Self-pay | Admitting: Hematology

## 2019-10-17 ENCOUNTER — Telehealth: Payer: Self-pay | Admitting: Hematology

## 2019-10-17 NOTE — Telephone Encounter (Signed)
Scheduled per 03/23 los, called and spoke with patient's husband. Patient will be notified of upcoming appointments.

## 2019-10-21 DIAGNOSIS — H2511 Age-related nuclear cataract, right eye: Secondary | ICD-10-CM | POA: Diagnosis not present

## 2019-10-21 DIAGNOSIS — H2512 Age-related nuclear cataract, left eye: Secondary | ICD-10-CM | POA: Diagnosis not present

## 2019-10-21 DIAGNOSIS — H524 Presbyopia: Secondary | ICD-10-CM | POA: Diagnosis not present

## 2019-11-10 ENCOUNTER — Other Ambulatory Visit (INDEPENDENT_AMBULATORY_CARE_PROVIDER_SITE_OTHER): Payer: Medicare HMO

## 2019-11-10 ENCOUNTER — Other Ambulatory Visit: Payer: Self-pay

## 2019-11-10 DIAGNOSIS — E890 Postprocedural hypothyroidism: Secondary | ICD-10-CM

## 2019-11-10 DIAGNOSIS — E7800 Pure hypercholesterolemia, unspecified: Secondary | ICD-10-CM

## 2019-11-10 DIAGNOSIS — E559 Vitamin D deficiency, unspecified: Secondary | ICD-10-CM

## 2019-11-10 LAB — COMPREHENSIVE METABOLIC PANEL
ALT: 23 U/L (ref 0–35)
AST: 28 U/L (ref 0–37)
Albumin: 4.3 g/dL (ref 3.5–5.2)
Alkaline Phosphatase: 36 U/L — ABNORMAL LOW (ref 39–117)
BUN: 22 mg/dL (ref 6–23)
CO2: 29 mEq/L (ref 19–32)
Calcium: 9.8 mg/dL (ref 8.4–10.5)
Chloride: 103 mEq/L (ref 96–112)
Creatinine, Ser: 0.94 mg/dL (ref 0.40–1.20)
GFR: 58.27 mL/min — ABNORMAL LOW (ref >60.00)
Glucose, Bld: 99 mg/dL (ref 70–99)
Potassium: 3.9 mEq/L (ref 3.5–5.1)
Sodium: 139 mEq/L (ref 135–145)
Total Bilirubin: 0.4 mg/dL (ref 0.2–1.2)
Total Protein: 6.8 g/dL (ref 6.0–8.3)

## 2019-11-10 LAB — T3, FREE: T3, Free: 2.7 pg/mL (ref 2.3–4.2)

## 2019-11-10 LAB — LIPID PANEL
Cholesterol: 177 mg/dL (ref 0–200)
HDL: 70.2 mg/dL (ref >39.00)
LDL Cholesterol: 90 mg/dL (ref 0–99)
NonHDL: 107.02
Total CHOL/HDL Ratio: 3
Triglycerides: 85 mg/dL (ref 0.0–149.0)
VLDL: 17 mg/dL (ref 0.0–40.0)

## 2019-11-10 LAB — T4, FREE: Free T4: 0.71 ng/dL (ref 0.60–1.60)

## 2019-11-10 LAB — TSH: TSH: 3.01 u[IU]/mL (ref 0.35–4.50)

## 2019-11-10 LAB — VITAMIN D 25 HYDROXY (VIT D DEFICIENCY, FRACTURES): VITD: 40.59 ng/mL (ref 30.00–100.00)

## 2019-11-13 ENCOUNTER — Other Ambulatory Visit: Payer: Self-pay

## 2019-11-13 ENCOUNTER — Telehealth (INDEPENDENT_AMBULATORY_CARE_PROVIDER_SITE_OTHER): Payer: Medicare HMO | Admitting: Endocrinology

## 2019-11-13 ENCOUNTER — Encounter: Payer: Self-pay | Admitting: Endocrinology

## 2019-11-13 DIAGNOSIS — E890 Postprocedural hypothyroidism: Secondary | ICD-10-CM

## 2019-11-13 DIAGNOSIS — E7800 Pure hypercholesterolemia, unspecified: Secondary | ICD-10-CM | POA: Diagnosis not present

## 2019-11-13 DIAGNOSIS — M810 Age-related osteoporosis without current pathological fracture: Secondary | ICD-10-CM | POA: Diagnosis not present

## 2019-11-13 DIAGNOSIS — E559 Vitamin D deficiency, unspecified: Secondary | ICD-10-CM | POA: Diagnosis not present

## 2019-11-13 NOTE — Progress Notes (Signed)
Patient ID: Samantha Graham, female   DOB: 08/06/1945, 74 y.o.   MRN: [id number]  I connected with the above-named patient by video enabled telemedicine application and verified that I am speaking with the correct person. The patient was explained the limitations of evaluation and management by telemedicine and the availability of in person appointments.  Patient also understood that there may be a patient responsible charge related to this service . Location of the patient: Patient's home . Location of the provider: Physician office Only the patient and myself were participating in the encounter The patient understood the above statements and agreed to proceed.    Reason for Appointment: Osteoporosis, followup visit   History of Present Illness:    HYPOTHYROID: This was first diagnosed in 1985  She has history of hyperthyroidism and thyroid nodule treated with thyroidectomy and ? Radioactive iodine but detailed records are not available of this According to her records since about 2001 had been taking 88 mcg brand name Synthroid  Since about 12/14 her TSH had been relatively low and her dose was reduced gradually to a final dose of 62.5 g She however was having complaints of extreme fatigue, brain fog and wanted to increase her dose to feel better  Because of her fatigue she was empirically started on Armour Thyroid in 04/2014 with 45 g. She did feel less fatigued with this along with less hair loss initially  Since she was concerned about feeling more fatigue on her last visit despite normal TSH she is now taking Armour Thyroid daily instead of 6 days a week With the change she has been feeling slightly better  She has been taking the Armour Thyroid daily in the morning before breakfast She takes her calcium/vitamin D separately  Weight is about the same apparently  Her TSH is normal at 3.0 T3 level is slightly improved  Labs as follows   Lab Results   Component Value Date   TSH 3.01 11/10/2019   TSH 3.17 07/07/2019   TSH 2.58 01/08/2019   FREET4 0.71 11/10/2019   FREET4 0.70 07/07/2019   FREET4 0.77 01/08/2019   Free T3 is 2.3   OTHER active problems addressed today: See review of systems   Lab on 11/10/2019  Component Date Value Ref Range Status  . VITD 11/10/2019 40.59  30.00 - 100.00 ng/mL Final  . T3, Free 11/10/2019 2.7  2.3 - 4.2 pg/mL Final  . TSH 11/10/2019 3.01  0.35 - 4.50 uIU/mL Final  . Free T4 11/10/2019 0.71  0.60 - 1.60 ng/dL Final   Comment: Specimens from patients who are undergoing biotin therapy and /or ingesting biotin supplements may contain high levels of biotin.  The higher biotin concentration in these specimens interferes with this Free T4 assay.  Specimens that contain high levels  of biotin may cause false high results for this Free T4 assay.  Please interpret results in light of the total clinical presentation of the patient.    . Cholesterol 11/10/2019 177  0 - 200 mg/dL Final   ATP III Classification       Desirable:  < 200 mg/dL               Borderline High:  200 - 239 mg/dL          High:  > = 240 mg/dL  . Triglycerides 11/10/2019 85.0  0.0 - 149.0 mg/dL Final   Normal:  <150 mg/dLBorderline High:  150 - 199 mg/dL  . HDL 11/10/2019  70.20  >39.00 mg/dL Final  . VLDL 11/10/2019 17.0  0.0 - 40.0 mg/dL Final  . LDL Cholesterol 11/10/2019 90  0 - 99 mg/dL Final  . Total CHOL/HDL Ratio 11/10/2019 3   Final                  Men          Women1/2 Average Risk     3.4          3.3Average Risk          5.0          4.42X Average Risk          9.6          7.13X Average Risk          15.0          11.0                      . NonHDL 11/10/2019 107.02   Final   NOTE:  Non-HDL goal should be 30 mg/dL higher than patient's LDL goal (i.e. LDL goal of < 70 mg/dL, would have non-HDL goal of < 100 mg/dL)  . Sodium 11/10/2019 139  135 - 145 mEq/L Final  . Potassium 11/10/2019 3.9  3.5 - 5.1 mEq/L Final  . Chloride  11/10/2019 103  96 - 112 mEq/L Final  . CO2 11/10/2019 29  19 - 32 mEq/L Final  . Glucose, Bld 11/10/2019 99  70 - 99 mg/dL Final  . BUN 11/10/2019 22  6 - 23 mg/dL Final  . Creatinine, Ser 11/10/2019 0.94  0.40 - 1.20 mg/dL Final  . Total Bilirubin 11/10/2019 0.4  0.2 - 1.2 mg/dL Final  . Alkaline Phosphatase 11/10/2019 36* 39 - 117 U/L Final  . AST 11/10/2019 28  0 - 37 U/L Final  . ALT 11/10/2019 23  0 - 35 U/L Final  . Total Protein 11/10/2019 6.8  6.0 - 8.3 g/dL Final  . Albumin 11/10/2019 4.3  3.5 - 5.2 g/dL Final  . GFR 11/10/2019 58.27* >60.00 mL/min Final  . Calcium 11/10/2019 9.8  8.4 - 10.5 mg/dL Final    Allergies as of 11/13/2019   No Known Allergies     Medication List       Accurate as of November 13, 2019  1:27 PM. If you have any questions, ask your nurse or doctor.        b complex-C-E-zinc tablet Take 1 tablet by mouth daily.   Fish Oil 1000 MG Caps Take by mouth.   GREEN TEA EXTRACT PO Take 1 tablet by mouth.   NON FORMULARY Take 1 capsule by mouth daily.   NON FORMULARY Take 1 Dose by mouth daily.   raloxifene 60 MG tablet Commonly known as: EVISTA TAKE 1 TABLET BY MOUTH EVERY DAY   rosuvastatin 10 MG tablet Commonly known as: CRESTOR TAKE 1 TABLET BY MOUTH EVERY DAY   thyroid 90 MG tablet Commonly known as: Armour Thyroid TAKE 1/2 A TABLET BY MOUTH DAILY, not on sunday What changed: additional instructions   Vitamin D3 125 MCG (5000 UT) Caps Take 5,000 Units by mouth once a week.       Allergies: No Known Allergies  Past Medical History:  Diagnosis Date  . Hyperlipidemia     Past Surgical History:  Procedure Laterality Date  . THYROIDECTOMY  1985    Family History  Problem Relation Age of Onset  . Thyroid disease  Mother   . Cancer Father   . Diabetes Cousin     Social History:  reports that she has never smoked. She has never used smokeless tobacco. She reports that she does not drink alcohol or use drugs.  REVIEW Of  SYSTEMS:  Weight history as follows   Wt Readings from Last 3 Encounters:  10/14/19 117 lb 4.8 oz (53.2 kg)  04/16/19 118 lb 9.6 oz (53.8 kg)  07/11/18 112 lb 9.6 oz (51.1 kg)    Osteoporosis:   Baseline bone density done by gynecologist showed T score was -3.0     Bone density on 08/08/16 shows T scores of -2.4 at spine (improved) and -2.2 at hip, stable  Report from 04/29/2019:  Bone density at spine is --1.9 Bone density at the hip is -2.4 on the left side and -2.0 on the right  She did not want to take Fosamax because it did not work with her mother apparently She was given ACTONEL in 2017 and again 2018 but stopped this because of her perception that she was having joint pains after taking this after a few prescriptions  Also concerned about possible hair loss as a side effect  Reclast was given in 5/16, she did have some nausea the next day Does not want to take Reclast again because she thinks it made her hair thin out  She was started on Evista on her last visit and is now taking it daily without any side effects or hot flashes  Vitamin D supplements: Taking 5000 units twice weekly  Vitamin D level has been consistently normal  Lab Results  Component Value Date   VD25OH 40.59 11/10/2019   VD25OH 33.96 07/07/2019   VD25OH 42.75 01/08/2019     CLL: She is being followed for asymptomatic disease and no treatment recommended WBC recently 37,000  Hypercholesterolemia:  Her baseline LDL was 210 and she is being treated with Crestor 10 mg  Has been following a low-cholesterol low-fat diet consistently now  Lipids are within target range although LDL relatively higher  Lab Results  Component Value Date   CHOL 177 11/10/2019   CHOL 165 07/07/2019   CHOL 167 01/08/2019   Lab Results  Component Value Date   HDL 70.20 11/10/2019   HDL 64.80 07/07/2019   HDL 64.30 01/08/2019   Lab Results  Component Value Date   LDLCALC 90 11/10/2019   [place] 78  07/07/2019   LDLCALC 84 01/08/2019   Lab Results  Component Value Date   TRIG 85.0 11/10/2019   TRIG 114.0 07/07/2019   TRIG 92.0 01/08/2019   Lab Results  Component Value Date   CHOLHDL 3 11/10/2019   CHOLHDL 3 07/07/2019   CHOLHDL 3 01/08/2019   No results found for: LDLDIRECT      Examination:   There were no vitals taken for this visit.     Assessment /Plan   OSTEOPOROSIS without fracture   Her baseline T score was - 3.0  She has had treatment with Reclast once and Actonel for less than 2 years Had stopped Actonel because she thought it was causing joint pains Now able to take Evista since her last visit without any side effects which should maintain her bone density She will continue this long-term  Continue vitamin D 5000 units at the same dose of twice a week  Lipids: Well-controlled although LDL slightly higher than before. She is generally following a low-fat diet Explained to her that since her LDL is below  100 and HDL is excellent as well as not having any other risk factors Crestor 20 mg can be continued unchanged  HYPOTHYROIDISM: She says she feels slightly better with increasing her Armour Thyroid to 7 days a week and will continue same dose Free T3 level relatively better than before  We will recheck in 6 months  Calcium levels: Patient had question about parathyroid and since her calcium levels have been consistently well within the normal range do not think she has hyperparathyroidism    There are no Patient Instructions on file for this visit.   Elayne Snare 11/13/2019, 1:27 PM

## 2019-11-26 ENCOUNTER — Other Ambulatory Visit: Payer: Self-pay | Admitting: Anesthesiology

## 2019-11-26 ENCOUNTER — Other Ambulatory Visit: Payer: Self-pay | Admitting: Nurse Practitioner

## 2019-11-26 ENCOUNTER — Other Ambulatory Visit: Payer: Self-pay | Admitting: *Deleted

## 2019-11-26 DIAGNOSIS — Z1231 Encounter for screening mammogram for malignant neoplasm of breast: Secondary | ICD-10-CM

## 2019-12-10 DIAGNOSIS — C44311 Basal cell carcinoma of skin of nose: Secondary | ICD-10-CM | POA: Diagnosis not present

## 2019-12-23 DIAGNOSIS — C9110 Chronic lymphocytic leukemia of B-cell type not having achieved remission: Secondary | ICD-10-CM | POA: Diagnosis not present

## 2019-12-29 DIAGNOSIS — C44311 Basal cell carcinoma of skin of nose: Secondary | ICD-10-CM | POA: Diagnosis not present

## 2019-12-29 DIAGNOSIS — L570 Actinic keratosis: Secondary | ICD-10-CM | POA: Diagnosis not present

## 2019-12-29 DIAGNOSIS — L578 Other skin changes due to chronic exposure to nonionizing radiation: Secondary | ICD-10-CM | POA: Diagnosis not present

## 2019-12-29 DIAGNOSIS — D225 Melanocytic nevi of trunk: Secondary | ICD-10-CM | POA: Diagnosis not present

## 2020-01-09 ENCOUNTER — Ambulatory Visit: Payer: Medicare HMO

## 2020-01-09 ENCOUNTER — Other Ambulatory Visit: Payer: Self-pay | Admitting: Endocrinology

## 2020-01-13 DIAGNOSIS — R69 Illness, unspecified: Secondary | ICD-10-CM | POA: Diagnosis not present

## 2020-01-15 ENCOUNTER — Ambulatory Visit
Admission: RE | Admit: 2020-01-15 | Discharge: 2020-01-15 | Disposition: A | Discharge status: Home / Self Care | Payer: Medicare HMO | Source: Ambulatory Visit | Attending: Nurse Practitioner | Admitting: Nurse Practitioner

## 2020-01-15 ENCOUNTER — Other Ambulatory Visit: Payer: Self-pay

## 2020-01-15 DIAGNOSIS — Z1231 Encounter for screening mammogram for malignant neoplasm of breast: Secondary | ICD-10-CM

## 2020-02-06 DIAGNOSIS — M25562 Pain in left knee: Secondary | ICD-10-CM | POA: Diagnosis not present

## 2020-03-09 DIAGNOSIS — R69 Illness, unspecified: Secondary | ICD-10-CM | POA: Diagnosis not present

## 2020-03-15 ENCOUNTER — Other Ambulatory Visit: Payer: Self-pay | Admitting: Endocrinology

## 2020-04-13 ENCOUNTER — Inpatient Hospital Stay: Payer: Medicare HMO | Admitting: Hematology

## 2020-04-13 ENCOUNTER — Other Ambulatory Visit: Payer: Self-pay

## 2020-04-13 ENCOUNTER — Inpatient Hospital Stay: Payer: Medicare HMO | Attending: Hematology

## 2020-04-13 VITALS — BP 111/61 | HR 67 | Temp 97.7°F | Resp 18 | Ht 62.0 in | Wt 118.7 lb

## 2020-04-13 DIAGNOSIS — C9110 Chronic lymphocytic leukemia of B-cell type not having achieved remission: Secondary | ICD-10-CM | POA: Diagnosis not present

## 2020-04-13 LAB — CBC WITH DIFFERENTIAL/PLATELET
Abs Immature Granulocytes: 0.13 10*3/uL — ABNORMAL HIGH (ref 0.00–0.07)
Basophils Absolute: 0.1 10*3/uL (ref 0.0–0.1)
Basophils Relative: 0 %
Eosinophils Absolute: 0.2 10*3/uL (ref 0.0–0.5)
Eosinophils Relative: 0 %
HCT: 42.6 % (ref 36.0–46.0)
Hemoglobin: 13.7 g/dL (ref 12.0–15.0)
Immature Granulocytes: 0 %
Lymphocytes Relative: 87 %
Lymphs Abs: 39.1 10*3/uL — ABNORMAL HIGH (ref 0.7–4.0)
MCH: 30.2 pg (ref 26.0–34.0)
MCHC: 32.2 g/dL (ref 30.0–36.0)
MCV: 93.8 fL (ref 80.0–100.0)
Monocytes Absolute: 0.9 10*3/uL (ref 0.1–1.0)
Monocytes Relative: 2 %
Neutro Abs: 5 10*3/uL (ref 1.7–7.7)
Neutrophils Relative %: 11 %
Platelets: 173 10*3/uL (ref 150–400)
RBC: 4.54 MIL/uL (ref 3.87–5.11)
RDW: 13.9 % (ref 11.5–15.5)
WBC: 45.4 10*3/uL — ABNORMAL HIGH (ref 4.0–10.5)
nRBC: 0 % (ref 0.0–0.2)

## 2020-04-13 LAB — CMP (CANCER CENTER ONLY)
ALT: 19 U/L (ref 0–44)
AST: 24 U/L (ref 15–41)
Albumin: 4 g/dL (ref 3.5–5.0)
Alkaline Phosphatase: 40 U/L (ref 38–126)
Anion gap: 5 (ref 5–15)
BUN: 22 mg/dL (ref 8–23)
CO2: 29 mmol/L (ref 22–32)
Calcium: 9.7 mg/dL (ref 8.9–10.3)
Chloride: 101 mmol/L (ref 98–111)
Creatinine: 0.98 mg/dL (ref 0.44–1.00)
GFR, Est AFR Am: >60 mL/min (ref >60)
GFR, Estimated: 57 mL/min — ABNORMAL LOW (ref >60)
Glucose, Bld: 89 mg/dL (ref 70–99)
Potassium: 4.6 mmol/L (ref 3.5–5.1)
Sodium: 135 mmol/L (ref 135–145)
Total Bilirubin: 0.5 mg/dL (ref 0.3–1.2)
Total Protein: 7.2 g/dL (ref 6.5–8.1)

## 2020-04-13 LAB — LACTATE DEHYDROGENASE: LDH: 146 U/L (ref 98–192)

## 2020-04-13 NOTE — Progress Notes (Signed)
HEMATOLOGY/ONCOLOGY CLINIC NOTE  Date of Service: 04/13/20    Patient Care Team: [name], PA-C (Inactive) as PCP - General (Physician Assistant)  CHIEF COMPLAINTS/PURPOSE OF CONSULTATION:  F/u for CLL  HISTORY OF PRESENTING ILLNESS:   Samantha Graham is a wonderful 74 y.o. female who has been referred to [place] by Dr. Joya Gaskins, Faylene Million, PA-C (Inactive) for evaluation and management of incidentally noted leucocytosis/Lymphocytosis.  Patient is generally in good overall health and on labs done for her annual physical in 06/2017 on labs was incidentally noted to have leucocytosis of 21.7k with lymphocytosis of 17.7k. Nl hgb of 13.8 and nl platelet count of 200k. She notes no new fatigue, fevers/chills/night sweats, overt LN enlargement, abdominal pain or distension. No recent viral infection. It is unclear if she has been noted to have lymphocytosis prior to these labs (old labs not available to [place] currently).  She was referred to [place] for further evaluation of her lymphocytosis.  She notes she feels no differently than has has felt over the last few years.  Patient notes that she is a non smoker. No other recent chemical exposures. No PRBC transfusions in the past.  Interval History:  Samantha Graham is a wonderful 74 y.o. female who is following up today regarding her Chronic Lymphocytic Leukemia. The patient's last visit with [place] was on 10/14/2019. The pt reports that she is doing well overall.  The pt reports that she has been experiencing an increase in fatigue. Pt continues her regular activities, but finds herself needing a short rest in the afternoons. Pt also notes an increase in muscular fatigue during activity. She has had her thyroid levels check recently and her Armour Thyroid was adjusted accordingly. 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   Pt recently had a Mohs surgery for a Squamous cell lesion on the left side of her nose. She continues  following with her Dermatologist every 6 months.   She has received her COVID19 and annual flu vaccines. She had a headache with her second COVID19 injection. Pt is up to date with her Pneumonia vaccines. Pt is having her Vitamin D levels monitored with her PCP.   Lab results today (04/13/20) of CBC w/diff and CMP is as follows: all values are WNL except for WBC at 45.4, Lymphs Abs at 39.1, WBC Morphology shows "Variant Lymphocytes", Abs Immature Granulocytes at 0.13K, Smudge Cells are "Present", GFR Est Non Af Am at 57. 04/13/2020 LDH at 146  On review of systems, pt reports fatigue and denies fevers, chills, night sweats, unexpected weight loss, new lumps/bumps, abdominal pain, SOB and any other symptoms.    MEDICAL HISTORY:  Past Medical History:  Diagnosis Date  . Hyperlipidemia   Hypothyroidism  Rt frozen shoulder Osteopenia/osteoporosis  SURGICAL HISTORY: Past Surgical History:  Procedure Laterality Date  . THYROIDECTOMY  1985  hysterectomy with BSO due to prolpased uterus Bunionectomy Meniscal injury -rt knee Bladder cyst removal  SOCIAL HISTORY: Social History   Socioeconomic History  . Marital status: Married    Spouse name: Not on file  . Number of children: Not on file  . Years of education: Not on file  . Highest education level: Not on file  Occupational History  . Not on file  Tobacco Use  . Smoking status: Never Smoker  . Smokeless tobacco: Never Used  Vaping Use  . Vaping Use: Never used  Substance and Sexual Activity  . Alcohol use: No  . Drug use: No  .  Sexual activity: Not on file  Other Topics Concern  . Not on file  Social History Narrative  . Not on file   Social Determinants of Health   Financial Resource Strain:   . Difficulty of Paying Living Expenses: Not on file  Food Insecurity:   . Worried About Charity fundraiser in the Last Year: Not on file  . Ran Out of Food in the Last Year: Not on file  Transportation Needs:   . Lack of  Transportation (Medical): Not on file  . Lack of Transportation (Non-Medical): Not on file  Physical Activity:   . Days of Exercise per Week: Not on file  . Minutes of Exercise per Session: Not on file  Stress:   . Feeling of Stress : Not on file  Social Connections:   . Frequency of Communication with Friends and Family: Not on file  . Frequency of Social Gatherings with Friends and Family: Not on file  . Attends Religious Services: Not on file  . Active Member of Clubs or Organizations: Not on file  . Attends Archivist Meetings: Not on file  . Marital Status: Not on file  Intimate Partner Violence:   . Fear of Current or Ex-Partner: Not on file  . Emotionally Abused: Not on file  . Physically Abused: Not on file  . Sexually Abused: Not on file    FAMILY HISTORY: Family History  Problem Relation Age of Onset  . Thyroid disease Mother   . Cancer Father   . Diabetes Cousin     ALLERGIES:  has No Known Allergies.  MEDICATIONS:  Current Outpatient Medications  Medication Sig Dispense Refill  . B Complex-C-E-Zn (B COMPLEX-C-E-ZINC) tablet Take 1 tablet by mouth daily.    . Cholecalciferol (VITAMIN D3) 5000 UNITS CAPS Take 5,000 Units by mouth once a week.     Nyoka Cowden Tea, Camellia sinensis, (GREEN TEA EXTRACT PO) Take 1 tablet by mouth.    . NON FORMULARY Take 1 capsule by mouth daily.     . NON FORMULARY Take 1 Dose by mouth daily.     . Omega-3 Fatty Acids (FISH OIL) 1000 MG CAPS Take by mouth.    . raloxifene (EVISTA) 60 MG tablet TAKE 1 TABLET BY MOUTH EVERY DAY 90 tablet 1  . rosuvastatin (CRESTOR) 10 MG tablet TAKE 1 TABLET BY MOUTH EVERY DAY 90 tablet 1  . thyroid (ARMOUR THYROID) 90 MG tablet TAKE 1/2 TABLET BY MOUTH DAILY, NOT ON SUNDAY 45 tablet 1   No current facility-administered medications for this visit.    REVIEW OF SYSTEMS:   A 10+ POINT REVIEW OF SYSTEMS WAS OBTAINED including neurology, dermatology, psychiatry, cardiac, respiratory, lymph,  extremities, GI, GU, Musculoskeletal, constitutional, breasts, reproductive, HEENT.  All pertinent positives are noted in the HPI.  All others are negative.   PHYSICAL EXAMINATION: ECOG PERFORMANCE STATUS: 0 - Asymptomatic  Vitals:   04/13/20 1129  BP: 111/61  Pulse: 67  Resp: 18  Temp: 97.7 F (36.5 C)  SpO2: 98%   Filed Weights   04/13/20 1129  Weight: 118 lb 11.2 oz (53.8 kg)   .Body mass index is 21.71 kg/m.   GENERAL:alert, in no acute distress and comfortable SKIN: no acute rashes, no significant lesions EYES: conjunctiva are pink and non-injected, sclera anicteric OROPHARYNX: MMM, no exudates, no oropharyngeal erythema or ulceration NECK: supple, no JVD LYMPH:  no palpable lymphadenopathy in the cervical, axillary or inguinal regions LUNGS: clear to auscultation b/l with  normal respiratory effort HEART: regular rate & rhythm ABDOMEN:  normoactive bowel sounds , non tender, not distended. No palpable hepatosplenomegaly.  Extremity: no pedal edema PSYCH: alert & oriented x 3 with fluent speech NEURO: no focal motor/sensory deficits  LABORATORY DATA:  I have reviewed the data as listed  . CBC Latest Ref Rng & Units 04/13/2020 10/14/2019 07/07/2019  WBC 4.0 - 10.5 K/uL 45.4(H) 37.1(H) 33.2 Repeated and verified X2.(HH)  Hemoglobin 12.0 - 15.0 g/dL 13.7 13.2 13.4  Hematocrit 36 - 46 % 42.6 41.0 41.3  Platelets 150 - 400 K/uL 173 178 261.0   . CBC    Component Value Date/Time   WBC 45.4 (H) 04/13/2020 0952   RBC 4.54 04/13/2020 0952   HGB 13.7 04/13/2020 0952   HGB 13.7 12/13/2017 1336   HCT 42.6 04/13/2020 0952   PLT 173 04/13/2020 0952   PLT 197 12/13/2017 1336   MCV 93.8 04/13/2020 0952   MCH 30.2 04/13/2020 0952   MCHC 32.2 04/13/2020 0952   RDW 13.9 04/13/2020 0952   LYMPHSABS 39.1 (H) 04/13/2020 0952   MONOABS 0.9 04/13/2020 0952   EOSABS 0.2 04/13/2020 0952   BASOSABS 0.1 04/13/2020 0952     . CMP Latest Ref Rng & Units 04/13/2020 11/10/2019  10/14/2019  Glucose 70 - 99 mg/dL 89 99 98  BUN 8 - 23 mg/dL 22 22 16   Creatinine 0.44 - 1.00 mg/dL 0.98 0.94 0.85  Sodium 135 - 145 mmol/L 135 139 140  Potassium 3.5 - 5.1 mmol/L 4.6 3.9 4.2  Chloride 98 - 111 mmol/L 101 103 103  CO2 22 - 32 mmol/L 29 29 27   Calcium 8.9 - 10.3 mg/dL 9.7 9.8 9.2  Total Protein 6.5 - 8.1 g/dL 7.2 6.8 6.6  Total Bilirubin 0.3 - 1.2 mg/dL 0.5 0.4 0.3  Alkaline Phos 38 - 126 U/L 40 36(L) 39  AST 15 - 41 U/L 24 28 24   ALT 0 - 44 U/L 19 23 18      Component     Latest Ref Rng & Units 08/03/2017  Kappa free light chain     3.3 - 19.4 mg/L 15.0  Lamda free light chains     5.7 - 26.3 mg/L 13.0  Kappa, lamda light chain ratio     0.26 - 1.65 1.15  LDH     125 - 245 U/L 173       FISH:     RADIOGRAPHIC STUDIES:  I have personally reviewed the radiological images as listed and agreed with the findings in the report. No results found.  ASSESSMENT & PLAN:   74 y.o. 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 No constitutional symptoms. Newly noted - incidentally on annual labs. No older labs available for reference.  FISH Prognostic panel revealed a monoallelic 29U deletion which  might suggest a more indolent course for her CLL  11/19/17 CT C/A/P revealed no changes in spleen size and no overtly pathologic adenopathy is identified. One upper normal sized lymph node is a 9 mm in short axis left axillary lymph node.   . Lab Results  Component Value Date   LDH 146 04/13/2020   PLAN: -Discussed pt labwork today, 04/13/20; moderate increase in lymphocytes, Hgb & PLT are nml, blood chemistries are good, LDH is WNL. -No lab or clinic evidence of significant CLL progression at this time. Will continue watchful observation.  -Recommended that the pt continue  to eat well, drink at least 48-64 oz of water each day, and walk  20-30 minutes each day.  -Advised pt that CLL, and even more so her 13q deletion, increases the risk of non-melanoma skin cancers.  -Discussed CDC guidelines regarding the COVID19 booster. Offered in clinic today.  -Recommend pt continue to f/u with her Dermatologist. -Will see back in 6 months with labs   .3) . Patient Active Problem List   Diagnosis Date Noted  . Hypercholesterolemia 05/02/2018  . Age related osteoporosis 08/04/2016  . 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   FOLLOW UP: RTC with Dr Irene Limbo with labs in 6 months   The total time spent in the appt was 20 minutes and more than 50% was on counseling and direct patient cares.  All of the patient's questions were answered with apparent satisfaction. The patient knows to call the clinic with any problems, questions or concerns.    Sullivan Lone MD [place] AAHIVMS [facility] Hematology/Oncology Physician [facility]  (Office):       [phone] (Work cell):  [phone] (Fax):           [phone]  04/13/2020 12:06 PM  I, Yevette Edwards, am acting as a scribe for Dr. Sullivan Lone.   .I have reviewed the above documentation for accuracy and completeness, and I agree with the above. Brunetta Genera MD

## 2020-04-14 DIAGNOSIS — Z03818 Encounter for observation for suspected exposure to other biological agents ruled out: Secondary | ICD-10-CM | POA: Diagnosis not present

## 2020-04-14 DIAGNOSIS — R509 Fever, unspecified: Secondary | ICD-10-CM | POA: Diagnosis not present

## 2020-04-14 DIAGNOSIS — R05 Cough: Secondary | ICD-10-CM | POA: Diagnosis not present

## 2020-04-14 DIAGNOSIS — Z1152 Encounter for screening for COVID-19: Secondary | ICD-10-CM | POA: Diagnosis not present

## 2020-04-16 ENCOUNTER — Other Ambulatory Visit (HOSPITAL_COMMUNITY): Payer: Self-pay

## 2020-04-16 ENCOUNTER — Telehealth: Payer: Self-pay | Admitting: *Deleted

## 2020-04-16 NOTE — Telephone Encounter (Signed)
Patient called - seen in office by Dr. Irene Limbo on 04/13/20. Had cold type respiratory symptoms next day. Had Covid test and it was positive. She wants to know if she is eligible for monoclonal antibodies and if so, what is the process? Dr.Kale informed.  Per Dr. Irene Limbo - gave patient number for patient hotline [phone] - patient's information will be reviewed to see if she meets criteria for infusion. Patient verbalized understanding.

## 2020-04-17 ENCOUNTER — Other Ambulatory Visit (HOSPITAL_COMMUNITY): Payer: Self-pay

## 2020-04-17 ENCOUNTER — Telehealth: Payer: Self-pay | Admitting: Unknown Physician Specialty

## 2020-04-17 ENCOUNTER — Ambulatory Visit (HOSPITAL_COMMUNITY)
Admission: RE | Admit: 2020-04-17 | Discharge: 2020-04-17 | Disposition: A | Discharge status: Home / Self Care | Payer: Medicare Other | Source: Ambulatory Visit | Attending: Pulmonary Disease | Admitting: Pulmonary Disease

## 2020-04-17 ENCOUNTER — Other Ambulatory Visit: Payer: Self-pay | Admitting: Unknown Physician Specialty

## 2020-04-17 DIAGNOSIS — Z23 Encounter for immunization: Secondary | ICD-10-CM | POA: Diagnosis not present

## 2020-04-17 DIAGNOSIS — U071 COVID-19: Secondary | ICD-10-CM | POA: Insufficient documentation

## 2020-04-17 DIAGNOSIS — C9110 Chronic lymphocytic leukemia of B-cell type not having achieved remission: Secondary | ICD-10-CM

## 2020-04-17 MED ORDER — SODIUM CHLORIDE 0.9 % IV SOLN
1200.0000 mg | Freq: Once | INTRAVENOUS | Status: AC
  Administered 2020-04-17: 1200 mg via INTRAVENOUS

## 2020-04-17 MED ORDER — SODIUM CHLORIDE 0.9 % IV SOLN: INTRAVENOUS | Status: DC | PRN

## 2020-04-17 MED ORDER — ALBUTEROL SULFATE HFA 108 (90 BASE) MCG/ACT IN AERS: 2.0000 | INHALATION_SPRAY | Freq: Once | RESPIRATORY_TRACT | Status: DC | PRN

## 2020-04-17 MED ORDER — FAMOTIDINE IN NACL 20-0.9 MG/50ML-% IV SOLN: 20.0000 mg | Freq: Once | INTRAVENOUS | Status: DC | PRN

## 2020-04-17 MED ORDER — EPINEPHRINE 0.3 MG/0.3ML IJ SOAJ: 0.3000 mg | Freq: Once | INTRAMUSCULAR | Status: DC | PRN

## 2020-04-17 MED ORDER — METHYLPREDNISOLONE SODIUM SUCC 125 MG IJ SOLR: 125.0000 mg | Freq: Once | INTRAMUSCULAR | Status: DC | PRN

## 2020-04-17 MED ORDER — DIPHENHYDRAMINE HCL 50 MG/ML IJ SOLN: 50.0000 mg | Freq: Once | INTRAMUSCULAR | Status: DC | PRN

## 2020-04-17 NOTE — Discharge Instructions (Signed)

## 2020-04-17 NOTE — Progress Notes (Signed)
  Diagnosis: COVID-19  Physician: Asencion Noble, MD  Procedure: Covid Infusion Clinic Med: casirivimab\imdevimab infusion - Provided patient with casirivimab\imdevimab fact sheet for patients, parents and caregivers prior to infusion.  Complications: No immediate complications noted.  Discharge: Discharged home   Alena Bills 04/17/2020

## 2020-04-17 NOTE — Telephone Encounter (Signed)
I connected by phone with Samantha Graham on 04/17/2020 at 12:18 PM to discuss the potential use of a new treatment for mild to moderate COVID-19 viral infection in non-hospitalized patients.  This patient is a 74 y.o. female that meets the FDA criteria for Emergency Use Authorization of COVID monoclonal antibody casirivimab/imdevimab or bamlanivimab/eteseviamb.  Has a (+) direct SARS-CoV-2 viral test result  Has mild or moderate COVID-19   Is NOT hospitalized due to COVID-19  Is within 10 days of symptom onset  Has at least one of the high risk factor(s) for progression to severe COVID-19 and/or hospitalization as defined in EUA.  Specific high risk criteria : Older age (>/= 74 yo), BMI > 25 and Immunosuppressive Disease or Treatment   I have spoken and communicated the following to the patient or parent/caregiver regarding COVID monoclonal antibody treatment:  1. FDA has authorized the emergency use for the treatment of mild to moderate COVID-19 in adults and pediatric patients with positive results of direct SARS-CoV-2 viral testing who are 38 years of age and older weighing at least 40 kg, and who are at high risk for progressing to severe COVID-19 and/or hospitalization.  2. The significant known and potential risks and benefits of COVID monoclonal antibody, and the extent to which such potential risks and benefits are unknown.  3. Information on available alternative treatments and the risks and benefits of those alternatives, including clinical trials.  4. Patients treated with COVID monoclonal antibody should continue to self-isolate and use infection control measures (e.g., wear mask, isolate, social distance, avoid sharing personal items, clean and disinfect high touch surfaces, and frequent handwashing) according to CDC guidelines.   5. The patient or parent/caregiver has the option to accept or refuse COVID monoclonal antibody treatment.  After reviewing this information  with the patient, the patient has agreed to receive one of the available covid 19 monoclonal antibodies and will be provided an appropriate fact sheet prior to infusion. Samantha Haddock, NP 04/17/2020 12:18 PM  Sx onset 9/21

## 2020-05-10 ENCOUNTER — Other Ambulatory Visit (INDEPENDENT_AMBULATORY_CARE_PROVIDER_SITE_OTHER): Payer: Medicare HMO

## 2020-05-10 ENCOUNTER — Other Ambulatory Visit: Payer: Self-pay

## 2020-05-10 DIAGNOSIS — E7800 Pure hypercholesterolemia, unspecified: Secondary | ICD-10-CM

## 2020-05-10 DIAGNOSIS — M810 Age-related osteoporosis without current pathological fracture: Secondary | ICD-10-CM | POA: Diagnosis not present

## 2020-05-10 DIAGNOSIS — E890 Postprocedural hypothyroidism: Secondary | ICD-10-CM | POA: Diagnosis not present

## 2020-05-10 DIAGNOSIS — E559 Vitamin D deficiency, unspecified: Secondary | ICD-10-CM

## 2020-05-10 LAB — COMPREHENSIVE METABOLIC PANEL
ALT: 14 U/L (ref 0–35)
AST: 21 U/L (ref 0–37)
Albumin: 4 g/dL (ref 3.5–5.2)
Alkaline Phosphatase: 33 U/L — ABNORMAL LOW (ref 39–117)
BUN: 19 mg/dL (ref 6–23)
CO2: 31 mEq/L (ref 19–32)
Calcium: 9.4 mg/dL (ref 8.4–10.5)
Chloride: 103 mEq/L (ref 96–112)
Creatinine, Ser: 1.21 mg/dL — ABNORMAL HIGH (ref 0.40–1.20)
GFR: 44 mL/min — ABNORMAL LOW (ref >60.00)
Glucose, Bld: 109 mg/dL — ABNORMAL HIGH (ref 70–99)
Potassium: 3.8 mEq/L (ref 3.5–5.1)
Sodium: 140 mEq/L (ref 135–145)
Total Bilirubin: 0.3 mg/dL (ref 0.2–1.2)
Total Protein: 6.5 g/dL (ref 6.0–8.3)

## 2020-05-10 LAB — VITAMIN D 25 HYDROXY (VIT D DEFICIENCY, FRACTURES): VITD: 28.56 ng/mL — ABNORMAL LOW (ref 30.00–100.00)

## 2020-05-10 LAB — LIPID PANEL
Cholesterol: 147 mg/dL (ref 0–200)
HDL: 55.6 mg/dL (ref >39.00)
LDL Cholesterol: 69 mg/dL (ref 0–99)
NonHDL: 91.79
Total CHOL/HDL Ratio: 3
Triglycerides: 115 mg/dL (ref 0.0–149.0)
VLDL: 23 mg/dL (ref 0.0–40.0)

## 2020-05-10 LAB — TSH: TSH: 1.65 u[IU]/mL (ref 0.35–4.50)

## 2020-05-10 LAB — T3, FREE: T3, Free: 2.5 pg/mL (ref 2.3–4.2)

## 2020-05-10 LAB — T4, FREE: Free T4: 0.58 ng/dL — ABNORMAL LOW (ref 0.60–1.60)

## 2020-05-11 LAB — CBC
HCT: 39.6 % (ref 36.0–46.0)
Hemoglobin: 12.9 g/dL (ref 12.0–15.0)
MCHC: 32.5 g/dL (ref 30.0–36.0)
MCV: 94.1 fl (ref 78.0–100.0)
Platelets: 183 10*3/uL (ref 150.0–400.0)
RBC: 4.21 Mil/uL (ref 3.87–5.11)
RDW: 14.4 % (ref 11.5–15.5)
WBC: 48.1 10*3/uL (ref 4.0–10.5)

## 2020-05-13 ENCOUNTER — Telehealth (INDEPENDENT_AMBULATORY_CARE_PROVIDER_SITE_OTHER): Payer: Medicare HMO | Admitting: Endocrinology

## 2020-05-13 ENCOUNTER — Encounter: Payer: Self-pay | Admitting: Endocrinology

## 2020-05-13 ENCOUNTER — Other Ambulatory Visit: Payer: Self-pay

## 2020-05-13 VITALS — Wt 115.0 lb

## 2020-05-13 DIAGNOSIS — E890 Postprocedural hypothyroidism: Secondary | ICD-10-CM | POA: Diagnosis not present

## 2020-05-13 DIAGNOSIS — M810 Age-related osteoporosis without current pathological fracture: Secondary | ICD-10-CM | POA: Diagnosis not present

## 2020-05-13 DIAGNOSIS — E7800 Pure hypercholesterolemia, unspecified: Secondary | ICD-10-CM

## 2020-05-13 DIAGNOSIS — R69 Illness, unspecified: Secondary | ICD-10-CM | POA: Diagnosis not present

## 2020-05-13 DIAGNOSIS — E559 Vitamin D deficiency, unspecified: Secondary | ICD-10-CM

## 2020-05-13 NOTE — Progress Notes (Signed)
Patient ID: Samantha Graham, female   DOB: 17-Sep-1945, 74 y.o.   MRN: [id number]  I connected with the above-named patient by video enabled telemedicine application and verified that I am speaking with the correct person. The patient was explained the limitations of evaluation and management by telemedicine and the availability of in person appointments.  Patient also understood that there may be a patient responsible charge related to this service . Location of the patient: Patient's home . Location of the provider: Physician office Only the patient and myself were participating in the encounter The patient understood the above statements and agreed to proceed.    Reason for Appointment: Osteoporosis, followup visit   History of Present Illness:    HYPOTHYROID: This was first diagnosed in 1985  She has history of hyperthyroidism and thyroid nodule treated with thyroidectomy and ? Radioactive iodine but detailed records are not available of this According to her records since about 2001 had been taking 88 mcg brand name Synthroid  Since about 12/14 her TSH had been relatively low and her dose was reduced gradually to a final dose of 62.5 g She however was having complaints of extreme fatigue, brain fog and wanted to increase her dose to feel better  Because of her fatigue she was empirically started on Armour Thyroid in 04/2014 with 45 g. She did feel less fatigued with this along with less hair loss initially  Previously she had fatigue despite normal TSH; since 12/20 is taking Armour Thyroid daily instead of 6 days a week With the increase she has had less fatigue; recently has had some intermittent fatigue for other reasons  She has been taking her Armour Thyroid daily in the morning before breakfast consistently She takes her calcium/vitamin D separately   Her TSH is normal at 1.65 T3 level is slightly improved Free T4 is slightly lower possibly from being on  Armour Thyroid  Labs as follows   Lab Results  Component Value Date   TSH 1.65 05/10/2020   TSH 3.01 11/10/2019   TSH 3.17 07/07/2019   FREET4 0.58 (L) 05/10/2020   FREET4 0.71 11/10/2019   FREET4 0.70 07/07/2019   Free T3 is 2.5   OTHER active problems addressed today: See review of systems   Lab on 05/10/2020  Component Date Value Ref Range Status  . Cholesterol 05/10/2020 147  0 - 200 mg/dL Final   ATP III Classification       Desirable:  < 200 mg/dL               Borderline High:  200 - 239 mg/dL          High:  > = 240 mg/dL  . Triglycerides 05/10/2020 115.0  0 - 149 mg/dL Final   Normal:  <150 mg/dLBorderline High:  150 - 199 mg/dL  . HDL 05/10/2020 55.60  >39.00 mg/dL Final  . VLDL 05/10/2020 23.0  0.0 - 40.0 mg/dL Final  . LDL Cholesterol 05/10/2020 69  0 - 99 mg/dL Final  . Total CHOL/HDL Ratio 05/10/2020 3   Final                  Men          Women1/2 Average Risk     3.4          3.3Average Risk          5.0          4.42X Average Risk  9.6          7.13X Average Risk          15.0          11.0                      . NonHDL 05/10/2020 91.79   Final   NOTE:  Non-HDL goal should be 30 mg/dL higher than patient's LDL goal (i.e. LDL goal of < 70 mg/dL, would have non-HDL goal of < 100 mg/dL)  . VITD 05/10/2020 28.56* 30.00 - 100.00 ng/mL Final  . WBC 05/10/2020 48.1 Repeated and verified X2.* 4.0 - 10.5 K/uL Final  . RBC 05/10/2020 4.21  3.87 - 5.11 Mil/uL Final  . Platelets 05/10/2020 183.0  150 - 400 K/uL Final  . Hemoglobin 05/10/2020 12.9  12.0 - 15.0 g/dL Final  . HCT 05/10/2020 39.6  36 - 46 % Final  . MCV 05/10/2020 94.1  78.0 - 100.0 fl Final  . MCHC 05/10/2020 32.5  30.0 - 36.0 g/dL Final  . RDW 05/10/2020 14.4  11.5 - 15.5 % Final  . T3, Free 05/10/2020 2.5  2.3 - 4.2 pg/mL Final  . TSH 05/10/2020 1.65  0.35 - 4.50 uIU/mL Final  . Free T4 05/10/2020 0.58* 0.60 - 1.60 ng/dL Final   Comment: Specimens from patients who are undergoing biotin therapy  and /or ingesting biotin supplements may contain high levels of biotin.  The higher biotin concentration in these specimens interferes with this Free T4 assay.  Specimens that contain high levels  of biotin may cause false high results for this Free T4 assay.  Please interpret results in light of the total clinical presentation of the patient.    . Sodium 05/10/2020 140  135 - 145 mEq/L Final  . Potassium 05/10/2020 3.8  3.5 - 5.1 mEq/L Final  . Chloride 05/10/2020 103  96 - 112 mEq/L Final  . CO2 05/10/2020 31  19 - 32 mEq/L Final  . Glucose, Bld 05/10/2020 109* 70 - 99 mg/dL Final  . BUN 05/10/2020 19  6 - 23 mg/dL Final  . Creatinine, Ser 05/10/2020 1.21* 0.40 - 1.20 mg/dL Final  . Total Bilirubin 05/10/2020 0.3  0.2 - 1.2 mg/dL Final  . Alkaline Phosphatase 05/10/2020 33* 39 - 117 U/L Final  . AST 05/10/2020 21  0 - 37 U/L Final  . ALT 05/10/2020 14  0 - 35 U/L Final  . Total Protein 05/10/2020 6.5  6.0 - 8.3 g/dL Final  . Albumin 05/10/2020 4.0  3.5 - 5.2 g/dL Final  . GFR 05/10/2020 44.00* >60.00 mL/min Final  . Calcium 05/10/2020 9.4  8.4 - 10.5 mg/dL Final    Allergies as of 05/13/2020   No Known Allergies     Medication List       Accurate as of May 13, 2020  3:29 PM. If you have any questions, ask your nurse or doctor.        b complex-C-E-zinc tablet Take 1 tablet by mouth daily.   Fish Oil 1000 MG Caps Take by mouth.   GREEN TEA EXTRACT PO Take 1 tablet by mouth.   NON FORMULARY Take 1 capsule by mouth daily.   NON FORMULARY Take 1 Dose by mouth daily.   raloxifene 60 MG tablet Commonly known as: EVISTA TAKE 1 TABLET BY MOUTH EVERY DAY   rosuvastatin 10 MG tablet Commonly known as: CRESTOR TAKE 1 TABLET BY MOUTH EVERY DAY   thyroid 90  MG tablet Commonly known as: Armour Thyroid TAKE 1/2 TABLET BY MOUTH DAILY, NOT ON SUNDAY   Vitamin D3 125 MCG (5000 UT) Caps Take 5,000 Units by mouth once a week.       Allergies: No Known  Allergies  Past Medical History:  Diagnosis Date  . Hyperlipidemia     Past Surgical History:  Procedure Laterality Date  . THYROIDECTOMY  1985    Family History  Problem Relation Age of Onset  . Thyroid disease Mother   . Cancer Father   . Diabetes Cousin     Social History:  reports that she has never smoked. She has never used smokeless tobacco. She reports that she does not drink alcohol and does not use drugs.  REVIEW Of SYSTEMS:  Weight history as follows   Wt Readings from Last 3 Encounters:  05/13/20 115 lb (52.2 kg)  04/13/20 118 lb 11.2 oz (53.8 kg)  10/14/19 117 lb 4.8 oz (53.2 kg)    Osteoporosis:   Baseline bone density done by gynecologist showed T score was -3.0     Bone density on 08/08/16 shows T scores of -2.4 at spine (improved) and -2.2 at hip, stable  Report from 04/29/2019:  Bone density at spine is --1.9 Bone density at the hip is -2.4 on the left side and -2.0 on the right  She did not want to take Fosamax because it did not work with her mother apparently She was given ACTONEL in 2017 and again 2018 but stopped this because of her perception that she was having joint pains after taking this after a few prescriptions Also concerned about possible hair loss as a side effect  Reclast was given in 5/16, she did have some nausea the next day Does not want to take Reclast again because she thinks it made her hair thin out  She was started on Evista in 12/20 and is now taking it daily She does think it makes her feel little hot but this is tolerable  Vitamin D supplements: She was taking 5000 units twice weekly but is now taking only a calcium and vitamin D combination  Vitamin D level has been previously normal and now down to 28  Lab Results  Component Value Date   VD25OH 28.56 (L) 05/10/2020   VD25OH 40.59 11/10/2019   VD25OH 33.96 07/07/2019     CLL: She is being followed at [facility] for asymptomatic disease and no treatment  recommended   Hypercholesterolemia:  Her baseline LDL was 210 and she is being treated with Crestor 10 mg  Has been following a low-cholesterol low-fat diet consistently now  Lipids are within target range and lower this time  Lab Results  Component Value Date   CHOL 147 05/10/2020   CHOL 177 11/10/2019   CHOL 165 07/07/2019   Lab Results  Component Value Date   HDL 55.60 05/10/2020   HDL 70.20 11/10/2019   HDL 64.80 07/07/2019   Lab Results  Component Value Date   LDLCALC 69 05/10/2020   [place] 90 11/10/2019   [place] 78 07/07/2019   Lab Results  Component Value Date   TRIG 115.0 05/10/2020   TRIG 85.0 11/10/2019   TRIG 114.0 07/07/2019   Lab Results  Component Value Date   CHOLHDL 3 05/10/2020   CHOLHDL 3 11/10/2019   CHOLHDL 3 07/07/2019   No results found for: LDLDIRECT  She had mild Covid in 9/21 and did get antibiotic infusion    Examination:   Wt  115 lb (52.2 kg)   BMI 21.03 kg/m      Assessment /Plan   OSTEOPOROSIS:   Her baseline T score was - 3.0  She is taking Evista and tolerates it reasonably well with only mild hot flashes She will continue this long-term We will need follow-up bone density next year  She needs to resume vitamin D 5000 units every other day since her level has been lower with taking only the small dose in her calcium supplement  Lipids: Well-controlled on Crestor  HYPOTHYROIDISM: Generally has been feeling well and taking Armour Thyroid every day, TSH excellent  She is questioning the use of an anti-inflammatory diet since she thinks she has inflammation including in her knees; recommended she does try to use healthy balanced meals with plenty of antioxidant rich foods  Follow-up in 6 months    Patient Instructions       Elayne Snare 05/13/2020, 3:29 PM

## 2020-05-19 DIAGNOSIS — Z1211 Encounter for screening for malignant neoplasm of colon: Secondary | ICD-10-CM | POA: Diagnosis not present

## 2020-05-19 DIAGNOSIS — R143 Flatulence: Secondary | ICD-10-CM | POA: Diagnosis not present

## 2020-05-19 DIAGNOSIS — R140 Abdominal distension (gaseous): Secondary | ICD-10-CM | POA: Diagnosis not present

## 2020-06-02 DIAGNOSIS — R69 Illness, unspecified: Secondary | ICD-10-CM | POA: Diagnosis not present

## 2020-06-03 DIAGNOSIS — H40053 Ocular hypertension, bilateral: Secondary | ICD-10-CM | POA: Diagnosis not present

## 2020-06-22 DIAGNOSIS — C9110 Chronic lymphocytic leukemia of B-cell type not having achieved remission: Secondary | ICD-10-CM | POA: Diagnosis not present

## 2020-06-22 DIAGNOSIS — Z7189 Other specified counseling: Secondary | ICD-10-CM | POA: Diagnosis not present

## 2020-06-22 DIAGNOSIS — U071 COVID-19: Secondary | ICD-10-CM | POA: Diagnosis not present

## 2020-06-28 DIAGNOSIS — L578 Other skin changes due to chronic exposure to nonionizing radiation: Secondary | ICD-10-CM | POA: Diagnosis not present

## 2020-06-28 DIAGNOSIS — C44311 Basal cell carcinoma of skin of nose: Secondary | ICD-10-CM | POA: Diagnosis not present

## 2020-06-28 DIAGNOSIS — D2261 Melanocytic nevi of right upper limb, including shoulder: Secondary | ICD-10-CM | POA: Diagnosis not present

## 2020-06-28 DIAGNOSIS — L570 Actinic keratosis: Secondary | ICD-10-CM | POA: Diagnosis not present

## 2020-06-28 DIAGNOSIS — D1801 Hemangioma of skin and subcutaneous tissue: Secondary | ICD-10-CM | POA: Diagnosis not present

## 2020-06-28 DIAGNOSIS — L918 Other hypertrophic disorders of the skin: Secondary | ICD-10-CM | POA: Diagnosis not present

## 2020-06-28 DIAGNOSIS — D2262 Melanocytic nevi of left upper limb, including shoulder: Secondary | ICD-10-CM | POA: Diagnosis not present

## 2020-06-28 DIAGNOSIS — L814 Other melanin hyperpigmentation: Secondary | ICD-10-CM | POA: Diagnosis not present

## 2020-06-30 ENCOUNTER — Other Ambulatory Visit: Payer: Self-pay | Admitting: Endocrinology

## 2020-07-05 DIAGNOSIS — Z1159 Encounter for screening for other viral diseases: Secondary | ICD-10-CM | POA: Diagnosis not present

## 2020-07-08 ENCOUNTER — Other Ambulatory Visit: Payer: Self-pay | Admitting: *Deleted

## 2020-07-08 DIAGNOSIS — K5730 Diverticulosis of large intestine without perforation or abscess without bleeding: Secondary | ICD-10-CM | POA: Diagnosis not present

## 2020-07-08 DIAGNOSIS — Z1211 Encounter for screening for malignant neoplasm of colon: Secondary | ICD-10-CM | POA: Diagnosis not present

## 2020-07-08 MED ORDER — RALOXIFENE HCL 60 MG PO TABS
60.0000 mg | ORAL_TABLET | Freq: Every day | ORAL | 1 refills | Status: DC
Start: 2020-07-08 — End: 2020-08-09

## 2020-07-09 ENCOUNTER — Other Ambulatory Visit: Payer: Self-pay | Admitting: Endocrinology

## 2020-07-27 DIAGNOSIS — U071 COVID-19: Secondary | ICD-10-CM | POA: Diagnosis not present

## 2020-07-29 ENCOUNTER — Telehealth: Payer: Self-pay | Admitting: *Deleted

## 2020-07-29 NOTE — Telephone Encounter (Signed)
Patient called - she tested positive for Covid on 1/4. Barely having symptoms - runny nose and cough. She is seeking advice. The [facility] testing center told her she should call PCP.  Per Dr. Clyda Greener direction - contacted patient: She can call PCP to be screened for antibody treatment. Also we can give her the [facility] number for antibody treatment. We do not do antibody treatment at the cancer center. She should isolate for at least 10 days and/or till asymptomatic. If worsening fevers/SOB should go to ED. Gave patient hotline number for antibody treatment: [phone].

## 2020-07-31 ENCOUNTER — Telehealth: Payer: Self-pay | Admitting: Unknown Physician Specialty

## 2020-07-31 ENCOUNTER — Telehealth (HOSPITAL_COMMUNITY): Payer: Self-pay | Admitting: Pharmacist

## 2020-07-31 ENCOUNTER — Encounter: Payer: Self-pay | Admitting: Unknown Physician Specialty

## 2020-07-31 ENCOUNTER — Other Ambulatory Visit: Payer: Self-pay | Admitting: Unknown Physician Specialty

## 2020-07-31 MED ORDER — NIRMATRELVIR/RITONAVIR (PAXLOVID)TABLET: 2.0000 | ORAL_TABLET | Freq: Two times a day (BID) | ORAL | 0 refills | Status: AC

## 2020-07-31 NOTE — Telephone Encounter (Signed)
Patient was prescribed oral covid treatment paxlovid and treatment note was reviewed. Medication has been received by [place] and reviewed for appropriateness.  Drug Interactions or Dosage Adjustments Noted:  Rosuvastatin: educated patient to hold rosuvastatin during paxlovid therapy; can resume 24 hours after last paxlovid dose   CrCl:  Recent labs: 34  Baseline: 42  Delivery Method: Pick-up (by patient's husband)  Patient contacted for counseling on 07/31/2020 and verbalized understanding.   Delivery or Pick-Up Date: 07/31/2020   Lowella Bandy 07/31/2020, 11:09 AM [facility] Outpatient Pharmacist Phone# [phone]

## 2020-07-31 NOTE — Telephone Encounter (Signed)
Outpatient Oral COVID Treatment Note  I connected with Samantha Graham on 07/31/2020/10:03 AM by telephone and verified that I am speaking with the correct person using two identifiers.  I discussed the limitations, risks, security, and privacy concerns of performing an evaluation and management service by telephone and the availability of in person appointments. I also discussed with the patient that there may be a patient responsible charge related to this service. The patient expressed understanding and agreed to proceed.  Patient location: home Provider location: home  Diagnosis: COVID-19 infection  Purpose of visit: Discussion of potential use of Molnupiravir or Paxlovid, a new treatment for mild to moderate COVID-19 viral infection in non-hospitalized patients.   Subjective: Patient is a 75 y.o. female who has been diagnosed with COVID 19 viral infection.  Their symptoms began on 1/4 with mild URI   Past Medical History:  Diagnosis Date  . Hyperlipidemia     No Known Allergies  Current Outpatient Medications on File Prior to Visit  Medication Sig Dispense Refill  . B Complex-C-E-Zn (B COMPLEX-C-E-ZINC) tablet Take 1 tablet by mouth daily.    . Cholecalciferol (VITAMIN D3) 5000 UNITS CAPS Take 5,000 Units by mouth once a week.     Nyoka Cowden Tea, Camellia sinensis, (GREEN TEA EXTRACT PO) Take 1 tablet by mouth.    . NON FORMULARY Take 1 capsule by mouth daily.     . NON FORMULARY Take 1 Dose by mouth daily.     . Omega-3 Fatty Acids (FISH OIL) 1000 MG CAPS Take by mouth.    . raloxifene (EVISTA) 60 MG tablet Take 1 tablet (60 mg total) by mouth daily. 90 tablet 1  . rosuvastatin (CRESTOR) 10 MG tablet TAKE 1 TABLET BY MOUTH EVERY DAY 90 tablet 1  . thyroid (ARMOUR THYROID) 90 MG tablet TAKE 1/2 TABLET BY MOUTH DAILY, NOT ON SUNDAY 45 tablet 1   No current facility-administered medications on file prior to visit.     Objective: Patient appears/sounds well  They are in no  apparent distress.  Breathing is non labored.  Mood and behavior are normal.  Laboratory Data:  Recent Results (from the past 2160 hour(s))  Lipid panel     Status: None   Collection Time: 05/10/20  2:15 PM  Result Value Ref Range   Cholesterol 147 0 - 200 mg/dL    Comment: ATP III Classification       Desirable:  < 200 mg/dL               Borderline High:  200 - 239 mg/dL          High:  > = 240 mg/dL   Triglycerides 115.0 0.0 - 149.0 mg/dL    Comment: Normal:  <150 mg/dLBorderline High:  150 - 199 mg/dL   HDL 55.60 >39.00 mg/dL   VLDL 23.0 0.0 - 40.0 mg/dL   LDL Cholesterol 69 0 - 99 mg/dL   Total CHOL/HDL Ratio 3     Comment:                Men          Women1/2 Average Risk     3.4          3.3Average Risk          5.0          4.42X Average Risk          9.6          7.13X  Average Risk          15.0          11.0                       NonHDL 91.79     Comment: NOTE:  Non-HDL goal should be 30 mg/dL higher than patient's LDL goal (i.e. LDL goal of < 70 mg/dL, would have non-HDL goal of < 100 mg/dL)  VITAMIN D 25 Hydroxy (Vit-D Deficiency, Fractures)     Status: Abnormal   Collection Time: 05/10/20  2:15 PM  Result Value Ref Range   VITD 28.56 (L) 30.00 - 100.00 ng/mL  CBC     Status: Abnormal   Collection Time: 05/10/20  2:15 PM  Result Value Ref Range   WBC 48.1 Repeated and verified X2. (HH) 4.0 - 10.5 K/uL   RBC 4.21 3.87 - 5.11 Mil/uL   Platelets 183.0 150.0 - 400.0 K/uL   Hemoglobin 12.9 12.0 - 15.0 g/dL   HCT 39.6 36.0 - 46.0 %   MCV 94.1 78.0 - 100.0 fl   MCHC 32.5 30.0 - 36.0 g/dL   RDW 14.4 11.5 - 15.5 %  T3, free     Status: None   Collection Time: 05/10/20  2:15 PM  Result Value Ref Range   T3, Free 2.5 2.3 - 4.2 pg/mL  TSH     Status: None   Collection Time: 05/10/20  2:15 PM  Result Value Ref Range   TSH 1.65 0.35 - 4.50 uIU/mL  T4, free     Status: Abnormal   Collection Time: 05/10/20  2:15 PM  Result Value Ref Range   Free T4 0.58 (L) 0.60 - 1.60 ng/dL     Comment: Specimens from patients who are undergoing biotin therapy and /or ingesting biotin supplements may contain high levels of biotin.  The higher biotin concentration in these specimens interferes with this Free T4 assay.  Specimens that contain high levels  of biotin may cause false high results for this Free T4 assay.  Please interpret results in light of the total clinical presentation of the patient.    Comprehensive metabolic panel     Status: Abnormal   Collection Time: 05/10/20  2:15 PM  Result Value Ref Range   Sodium 140 135 - 145 mEq/L   Potassium 3.8 3.5 - 5.1 mEq/L   Chloride 103 96 - 112 mEq/L   CO2 31 19 - 32 mEq/L   Glucose, Bld 109 (H) 70 - 99 mg/dL   BUN 19 6 - 23 mg/dL   Creatinine, Ser 1.21 (H) 0.40 - 1.20 mg/dL   Total Bilirubin 0.3 0.2 - 1.2 mg/dL   Alkaline Phosphatase 33 (L) 39 - 117 U/L   AST 21 0 - 37 U/L   ALT 14 0 - 35 U/L   Total Protein 6.5 6.0 - 8.3 g/dL   Albumin 4.0 3.5 - 5.2 g/dL   GFR 44.00 (L) >60.00 mL/min   Calcium 9.4 8.4 - 10.5 mg/dL     Assessment: 75 y.o. female with mild/moderate COVID 19 viral infection diagnosed on 1/4 at high risk for progression to severe COVID 19.  Plan:  This patient is a 75 y.o. female that meets the following criteria for Emergency Use Authorization of: Paxlovid 1. Age >12 yr AND > 40 kg 2. SARS-COV-2 positive test 3. Symptom onset < 5 days 4. Mild-to-moderate COVID disease with high risk for severe progression to hospitalization or death  I have spoken and communicated the following to the patient or parent/caregiver regarding: 1. Paxlovid is an unapproved drug that is authorized for use under an Emergency Use Authorization.  2. There are no adequate, approved, available products for the treatment of COVID-19 in adults who have mild-to-moderate COVID-19 and are at high risk for progressing to severe COVID-19, including hospitalization or death. 3. Other therapeutics are currently authorized. For additional  information on all products authorized for treatment or prevention of COVID-19, please see [employer].  4. There are benefits and risks of taking this treatment as outlined in the "Fact Sheet for Patients and Caregivers."  5. "Fact Sheet for Patients and Caregivers" was reviewed with patient. A hard copy will be provided to patient from pharmacy prior to the patient receiving treatment. 6. Patients should continue to self-isolate and use infection control measures (e.g., wear mask, isolate, social distance, avoid sharing personal items, clean and disinfect "high touch" surfaces, and frequent handwashing) according to CDC guidelines.  7. The patient or parent/caregiver has the option to accept or refuse treatment. 8. Patient medication history was reviewed for potential drug interactions:Statin which she will hold 9. Patient's creatinine clearance was calculated to be 44, and they were therefore prescribed Reduced dose (CrCl 30-60) - nirmatrelvir 150mg  tab (1 tablet) by mouth twice daily AND ritonavir 100mg  tab (1 tablet) by mouth twice daily   After reviewing above information with the patient, the patient agrees to receive Paxlovid.  Follow up instructions:    . Take prescription BID x 5 days as directed . Reach out to pharmacist for counseling on medication if desired . For concerns regarding further COVID symptoms please follow up with your PCP or urgent care . For urgent or life-threatening issues, seek care at your local emergency department  The patient was provided an opportunity to ask questions, and all were answered. The patient agreed with the plan and demonstrated an understanding of the instructions.   Script sent to [facility] and opted to pick up RX.   The patient was advised to call their PCP or seek an in-person evaluation if the symptoms worsen  or if the condition fails to improve as anticipated.   I provided 20 minutes of non face-to-face telephone visit time during this encounter, and > 50% was spent counseling as documented under my assessment & plan.  Kathrine Haddock, NP 07/31/2020 /10:03 AM

## 2020-08-03 ENCOUNTER — Encounter (HOSPITAL_COMMUNITY): Payer: Self-pay

## 2020-08-03 ENCOUNTER — Emergency Department (HOSPITAL_COMMUNITY)
Admission: EM | Admit: 2020-08-03 | Discharge: 2020-08-03 | Disposition: A | Discharge status: Home / Self Care | Payer: Medicare HMO | Attending: Emergency Medicine | Admitting: Emergency Medicine

## 2020-08-03 ENCOUNTER — Other Ambulatory Visit: Payer: Self-pay

## 2020-08-03 DIAGNOSIS — Z8616 Personal history of COVID-19: Secondary | ICD-10-CM | POA: Diagnosis not present

## 2020-08-03 DIAGNOSIS — R112 Nausea with vomiting, unspecified: Secondary | ICD-10-CM

## 2020-08-03 DIAGNOSIS — R197 Diarrhea, unspecified: Secondary | ICD-10-CM | POA: Insufficient documentation

## 2020-08-03 LAB — CBC WITH DIFFERENTIAL/PLATELET
Abs Immature Granulocytes: 0.13 10*3/uL — ABNORMAL HIGH (ref 0.00–0.07)
Basophils Absolute: 0.2 10*3/uL — ABNORMAL HIGH (ref 0.0–0.1)
Basophils Relative: 0 %
Eosinophils Absolute: 0.1 10*3/uL (ref 0.0–0.5)
Eosinophils Relative: 0 %
HCT: 42.2 % (ref 36.0–46.0)
Hemoglobin: 13.8 g/dL (ref 12.0–15.0)
Immature Granulocytes: 0 %
Lymphocytes Relative: 85 %
Lymphs Abs: 44.6 10*3/uL — ABNORMAL HIGH (ref 0.7–4.0)
MCH: 30.3 pg (ref 26.0–34.0)
MCHC: 32.7 g/dL (ref 30.0–36.0)
MCV: 92.5 fL (ref 80.0–100.0)
Monocytes Absolute: 0.7 10*3/uL (ref 0.1–1.0)
Monocytes Relative: 1 %
Neutro Abs: 7.3 10*3/uL (ref 1.7–7.7)
Neutrophils Relative %: 14 %
Platelets: 193 10*3/uL (ref 150–400)
RBC: 4.56 MIL/uL (ref 3.87–5.11)
RDW: 14.2 % (ref 11.5–15.5)
WBC: 53 10*3/uL (ref 4.0–10.5)
nRBC: 0 % (ref 0.0–0.2)

## 2020-08-03 LAB — COMPREHENSIVE METABOLIC PANEL
ALT: 19 U/L (ref 0–44)
AST: 33 U/L (ref 15–41)
Albumin: 4.3 g/dL (ref 3.5–5.0)
Alkaline Phosphatase: 38 U/L (ref 38–126)
Anion gap: 10 (ref 5–15)
BUN: 11 mg/dL (ref 8–23)
CO2: 23 mmol/L (ref 22–32)
Calcium: 9.3 mg/dL (ref 8.9–10.3)
Chloride: 102 mmol/L (ref 98–111)
Creatinine, Ser: 0.73 mg/dL (ref 0.44–1.00)
GFR, Estimated: >60 mL/min (ref >60)
Glucose, Bld: 117 mg/dL — ABNORMAL HIGH (ref 70–99)
Potassium: 4.7 mmol/L (ref 3.5–5.1)
Sodium: 135 mmol/L (ref 135–145)
Total Bilirubin: 0.5 mg/dL (ref 0.3–1.2)
Total Protein: 7.5 g/dL (ref 6.5–8.1)

## 2020-08-03 LAB — LIPASE, BLOOD: Lipase: 44 U/L (ref 11–51)

## 2020-08-03 MED ORDER — ALUM & MAG HYDROXIDE-SIMETH 200-200-20 MG/5ML PO SUSP
30.0000 mL | Freq: Once | ORAL | Status: AC
  Administered 2020-08-03: 30 mL via ORAL
  Filled 2020-08-03: qty 30

## 2020-08-03 MED ORDER — SODIUM CHLORIDE 0.9 % IV BOLUS
1000.0000 mL | Freq: Once | INTRAVENOUS | Status: AC
  Administered 2020-08-03: 1000 mL via INTRAVENOUS

## 2020-08-03 MED ORDER — ONDANSETRON HCL 4 MG/2ML IJ SOLN
4.0000 mg | Freq: Once | INTRAMUSCULAR | Status: AC
  Administered 2020-08-03: 4 mg via INTRAVENOUS
  Filled 2020-08-03: qty 2

## 2020-08-03 MED ORDER — ONDANSETRON 4 MG PO TBDP: ORAL_TABLET | ORAL | 0 refills | Status: DC

## 2020-08-03 NOTE — ED Triage Notes (Addendum)
Pt reports dx with covid 19 on 07/27/20. Given rx "PAXLOVID" to help. Since then has been nauseated, diarrhea, and headache all since yesterday. Hx CLL

## 2020-08-03 NOTE — ED Notes (Signed)
Given ginger ale, water and crackers and encouraged to eat/ drink.

## 2020-08-03 NOTE — ED Provider Notes (Addendum)
[facility] DEPT Provider Note   CSN: [id number] Arrival date & time: 08/03/20  0310     History No chief complaint on file.   Samantha Graham is a 75 y.o. female.  75 yo F with a chief complaint of nausea vomiting and diarrhea.  The patient was diagnosed with coronavirus about 7 days ago.  She qualified for one of the new oral monoclonal antibody medications.  She began taking that without any issues until yesterday morning when she started having profuse vomiting and diarrhea.  She feels like her stomach is upset but denies overt pain.  Denies chest pain or pressure.  Denies any difficulty with breathing.  Has really not been able to eat or drink well.  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   The history is provided by the patient.  Illness Severity:  Moderate Onset quality:  Gradual Duration:  2 days Timing:  Constant Progression:  Worsening Chronicity:  New Associated symptoms: diarrhea, nausea and vomiting   Associated symptoms: no chest pain, no congestion, no fever, no headaches, no myalgias, no rhinorrhea, no shortness of breath and no wheezing        Past Medical History:  Diagnosis Date  . Hyperlipidemia     Patient Active Problem List   Diagnosis Date Noted  . Hypercholesterolemia 05/02/2018  . Age related osteoporosis 08/04/2016  . 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 THYROIDECTOMY  1985     OB History   No obstetric history on file.     Family History  Problem Relation Age of Onset  . Thyroid disease Mother   . Cancer Father   . Diabetes Cousin     Social History   Tobacco Use  . Smoking status: Never Smoker  . Smokeless tobacco: Never Used  Vaping Use  . Vaping Use: Never used  Substance Use Topics  . Alcohol use: No  . Drug use: No    Home Medications Prior to Admission medications   Medication Sig Start Date End Date Taking? Authorizing  Provider  ondansetron (ZOFRAN ODT) 4 MG disintegrating tablet 4mg  ODT q4 hours prn nausea/vomit 08/03/20  Yes Shiann Kam, DO  B Complex-C-E-Zn (B COMPLEX-C-E-ZINC) tablet Take 1 tablet by mouth daily.    [provider]  Cholecalciferol (VITAMIN D3) 5000 UNITS CAPS Take 5,000 Units by mouth once a week.     [provider]  Nyoka Cowden Tea, Camellia sinensis, (GREEN TEA EXTRACT PO) Take 1 tablet by mouth.    [provider]  nirmatrelvir/ritonavir EUA (PAXLOVID) TABS Take 2 tablets by mouth 2 (two) times daily for 5 days. Take nirmatrelvir (150 mg) 1 tablet(s) twice daily for 5 days and ritonavir (100 mg) one tablet twice daily for 5 days. 07/31/20 08/05/20  Kathrine Haddock, NP  NON FORMULARY Take 1 capsule by mouth daily.     [provider]  NON FORMULARY Take 1 Dose by mouth daily.     [provider]  Omega-3 Fatty Acids (FISH OIL) 1000 MG CAPS Take by mouth.    [provider]  raloxifene (EVISTA) 60 MG tablet Take 1 tablet (60 mg total) by mouth daily. 07/08/20   Elayne Snare, MD  rosuvastatin (CRESTOR) 10 MG tablet TAKE 1 TABLET BY MOUTH EVERY DAY 07/09/20   Elayne Snare, MD  thyroid [facility] THYROID) 90 MG tablet TAKE 1/2 TABLET BY MOUTH DAILY, NOT ON SUNDAY 03/16/20   Elayne Snare, MD  Allergies    Patient has no known allergies.  Review of Systems   Review of Systems  Constitutional: Negative for chills and fever.  HENT: Negative for congestion and rhinorrhea.   Eyes: Negative for redness and visual disturbance.  Respiratory: Negative for shortness of breath and wheezing.   Cardiovascular: Negative for chest pain and palpitations.  Gastrointestinal: Positive for diarrhea, nausea and vomiting.  Genitourinary: Negative for dysuria and urgency.  Musculoskeletal: Negative for arthralgias and myalgias.  Skin: Negative for pallor and wound.  Neurological: Negative for dizziness and headaches.    Physical Exam Updated Vital Signs BP 122/62  (BP Location: Left Arm)   Pulse 82   Temp 98.6 F (37 C) (Oral)   Resp 19   SpO2 100%   Physical Exam Vitals and nursing note reviewed.  Constitutional:      General: She is not in acute distress.    Appearance: She is well-developed and well-nourished. She is not diaphoretic.  HENT:     Head: Normocephalic and atraumatic.  Eyes:     Extraocular Movements: EOM normal.     Pupils: Pupils are equal, round, and reactive to light.  Cardiovascular:     Rate and Rhythm: Normal rate and regular rhythm.     Heart sounds: No murmur heard. No friction rub. No gallop.   Pulmonary:     Effort: Pulmonary effort is normal.     Breath sounds: No wheezing or rales.  Abdominal:     General: There is no distension.     Palpations: Abdomen is soft.     Tenderness: There is no abdominal tenderness.  Musculoskeletal:        General: No tenderness or edema.     Cervical back: Normal range of motion and neck supple.  Skin:    General: Skin is warm and dry.  Neurological:     Mental Status: She is alert and oriented to person, place, and time.  Psychiatric:        Mood and Affect: Mood and affect normal.        Behavior: Behavior normal.     ED Results / Procedures / Treatments   Labs (all labs ordered are listed, but only abnormal results are displayed) Labs Reviewed  CBC WITH DIFFERENTIAL/PLATELET - Abnormal; Notable for the following components:      Result Value   WBC 53.0 (*)    Lymphs Abs 44.6 (*)    Basophils Absolute 0.2 (*)    Abs Immature Granulocytes 0.13 (*)    All other components within normal limits  COMPREHENSIVE METABOLIC PANEL - Abnormal; Notable for the following components:   Glucose, Bld 117 (*)    All other components within normal limits  LIPASE, BLOOD    EKG None  Radiology No results found.  Procedures Procedures (including critical care time)  Medications Ordered in ED Medications  alum & mag hydroxide-simeth (MAALOX/MYLANTA) 200-200-20 MG/5ML  suspension 30 mL (has no administration in time range)  sodium chloride 0.9 % bolus 1,000 mL (1,000 mLs Intravenous New Bag/Given 08/03/20 0621)  ondansetron (ZOFRAN) injection 4 mg (4 mg Intravenous Given 08/03/20 1610)    ED Course  I have reviewed the triage vital signs and the nursing notes.  Pertinent labs & imaging results that were available during my care of the patient were reviewed by me and considered in my medical decision making (see chart for details).    MDM Rules/Calculators/A&P  75 yo F with a chief complaints of nausea vomiting and diarrhea after starting monoclonal antibody oral therapy for the coronavirus.  Likely this is a side effect from her medicine.  Timeline wise seems less likely primarily related to the coronavirus.  On literature review it looks like there has been some cases of hepatitis caused by these medications.  We will check a laboratory evaluation give a bolus of IV fluids antiemetics reassess.  On reassessment patient feeling much better.  No significant LFT elevation lipase is normal.  Patient's white blood cell count is mildly elevated but seems to be near baseline.  Normal platelets no anemia.  Will perform an oral trial finished IV fluids.  Discharged home with PCP and oncology follow-up.  7:13 AM:  I have discussed the diagnosis/risks/treatment options with the patient and believe the pt to be eligible for discharge home to follow-up with PCP. We also discussed returning to the ED immediately if new or worsening sx occur. We discussed the sx which are most concerning (e.g., sudden worsening pain, fever, inability to tolerate by mouth) that necessitate immediate return. Medications administered to the patient during their visit and any new prescriptions provided to the patient are listed below.  Medications given during this visit Medications  alum & mag hydroxide-simeth (MAALOX/MYLANTA) 200-200-20 MG/5ML suspension 30 mL (has no  administration in time range)  sodium chloride 0.9 % bolus 1,000 mL (1,000 mLs Intravenous New Bag/Given 08/03/20 0621)  ondansetron (ZOFRAN) injection 4 mg (4 mg Intravenous Given 08/03/20 0622)     The patient appears reasonably screen and/or stabilized for discharge and I doubt any other medical condition or other [facility] requiring further screening, evaluation, or treatment in the ED at this time prior to discharge.   Final Clinical Impression(s) / ED Diagnoses Final diagnoses:  Nausea and vomiting in adult    Rx / DC Orders ED Discharge Orders         Ordered    ondansetron (ZOFRAN ODT) 4 MG disintegrating tablet        08/03/20 0659           Deno Etienne, DO 08/03/20 Baker, Lamoni, DO 08/03/20 [phone]

## 2020-08-03 NOTE — Discharge Instructions (Signed)
Return for abdominal pain, fever, inability to eat or drink

## 2020-08-03 NOTE — ED Provider Notes (Signed)
Blood pressure 122/62, pulse 82, temperature 98.6 F (37 C), temperature source Oral, resp. rate 19, SpO2 100 %.  Assuming care from Dr. Tyrone Nine.  In short, Samantha Graham is a 75 y.o. female with a chief complaint of No chief complaint on file. [name]  Refer to the original H&P for additional details.  The current plan of care is to follow up after PO trial.  07:55 AM  Patient is eating crackers and feeling better. Dr. Tyrone Nine called Zofran to her pharmacy. Patient to f/u with PCP by phone to update on symptoms and follow up plan.     Margette Fast, MD 08/03/20 [phone]

## 2020-08-03 NOTE — ED Notes (Signed)
Date and time results received: 08/03/20 6:49 AM (use smartphrase ".now" to insert current time)  Test: WBC Critical Value: 68  Name of Provider Notified: Tyrone Nine  Orders Received? Or Actions Taken?: Orders Received - See Orders for details

## 2020-08-04 ENCOUNTER — Telehealth: Payer: Self-pay | Admitting: *Deleted

## 2020-08-05 NOTE — Telephone Encounter (Signed)
Patient called 1/12: Covid + 1/4. Respiratory symptoms - contacted PCP - was referred to [facility]. Assessed telephonically and prescribed Paxlovid. Took for approx 48 hours beginning 1/8. Had severe nausea and diarrhea. Went to [facility] ED 1/10 - treated with fluids and anti nausea meds. Contacted PCP and was advised to update Dr. Irene Limbo. She states decreased respiratory symptoms at this time. Stated her WBCs were elevated and wanted to know if that was a concern.Also asked when she would be able to take booster vaccine - was to have it this week. Dr. Irene Limbo informed.  Per Dr. Irene Limbo - mild increase in WBC counts not immediately concerning-- there will be some reactive lymphocytosis expected from viral infection in addition to her CLL. She should wait 2 months for booster.   Contacted patient with information from Dr. Irene Limbo 1/13 - she states she is feeling better, less respiratory symptoms every day. She verbalized understanding of all information.

## 2020-08-09 ENCOUNTER — Other Ambulatory Visit: Payer: Self-pay | Admitting: *Deleted

## 2020-08-09 MED ORDER — RALOXIFENE HCL 60 MG PO TABS
60.0000 mg | ORAL_TABLET | Freq: Every day | ORAL | 1 refills | Status: DC
Start: 2020-08-09 — End: 2021-08-02

## 2020-08-24 ENCOUNTER — Other Ambulatory Visit: Payer: Self-pay | Admitting: Endocrinology

## 2020-08-30 DIAGNOSIS — Z8616 Personal history of COVID-19: Secondary | ICD-10-CM | POA: Diagnosis not present

## 2020-08-30 DIAGNOSIS — R42 Dizziness and giddiness: Secondary | ICD-10-CM | POA: Diagnosis not present

## 2020-08-30 DIAGNOSIS — H6121 Impacted cerumen, right ear: Secondary | ICD-10-CM | POA: Diagnosis not present

## 2020-08-31 DIAGNOSIS — H40053 Ocular hypertension, bilateral: Secondary | ICD-10-CM | POA: Diagnosis not present

## 2020-09-24 DIAGNOSIS — Z23 Encounter for immunization: Secondary | ICD-10-CM | POA: Diagnosis not present

## 2020-09-28 DIAGNOSIS — H527 Unspecified disorder of refraction: Secondary | ICD-10-CM | POA: Diagnosis not present

## 2020-09-28 DIAGNOSIS — Z961 Presence of intraocular lens: Secondary | ICD-10-CM | POA: Diagnosis not present

## 2020-09-28 DIAGNOSIS — H26493 Other secondary cataract, bilateral: Secondary | ICD-10-CM | POA: Diagnosis not present

## 2020-10-10 NOTE — Progress Notes (Signed)
HEMATOLOGY/ONCOLOGY CLINIC NOTE  Date of Service: 10/11/20    Patient Care Team: Orpah Melter, MD as PCP - General (Family Medicine)  CHIEF COMPLAINTS/PURPOSE OF CONSULTATION:  F/u for CLL  HISTORY OF PRESENTING ILLNESS:   Samantha Graham is a wonderful 75 y.o. female who has been referred to [place] by Dr. Orpah Melter, MD for evaluation and management of incidentally noted leucocytosis/Lymphocytosis.  Patient is generally in good overall health and on labs done for her annual physical in 06/2017 on labs was incidentally noted to have leucocytosis of 21.7k with lymphocytosis of 17.7k. Nl hgb of 13.8 and nl platelet count of 200k. She notes no new fatigue, fevers/chills/night sweats, overt LN enlargement, abdominal pain or distension. No recent viral infection. It is unclear if she has been noted to have lymphocytosis prior to these labs (old labs not available to [place] currently).  She was referred to [place] for further evaluation of her lymphocytosis.  She notes she feels no differently than has has felt over the last few years.  Patient notes that she is a non smoker. No other recent chemical exposures. No PRBC transfusions in the past.  INTERVAL HISTORY:  Samantha Graham is a wonderful 75 y.o. female who is following up today regarding her Chronic Lymphocytic Leukemia. The patient's last visit with [place] was on 04/13/2020. The pt reports that she is doing well overall.  The pt reports that she has gradually been improving since her COVID infection in January. The pt also just received her COVID booster and Pneumonia vaccines. The pt notes some intermittent fatigue, but is doing well overall. The pt notes that she visited her Endocrinologist, who increased her Vitamin D levels from 5000 IU weekly to daily due to low levels.  Lab results today 10/11/2020 of CBC w/diff and CMP is as follows: all values are WNL except for WBC of 43.1K, Glucose of 105, BUN of 25, Alkaline  Phosphatase of 35. 10/11/2020 LDH . Lab Results  Component Value Date   LDH 129 10/11/2020   On review of systems, pt reports fatigue and denies fevers, chills, night sweats, new lumps/bumps, abdominal pain, back pain, leg swelling, and any other symptoms.  MEDICAL HISTORY:  Past Medical History:  Diagnosis Date  . Hyperlipidemia   Hypothyroidism  Rt frozen shoulder Osteopenia/osteoporosis  SURGICAL HISTORY: Past Surgical History:  Procedure Laterality Date  . THYROIDECTOMY  1985  hysterectomy with BSO due to prolpased uterus Bunionectomy Meniscal injury -rt knee Bladder cyst removal  SOCIAL HISTORY: Social History   Socioeconomic History  . Marital status: Married    Spouse name: Not on file  . Number of children: Not on file  . Years of education: Not on file  . Highest education level: Not on file  Occupational History  . Not on file  Tobacco Use  . Smoking status: Never Smoker  . Smokeless tobacco: Never Used  Vaping Use  . Vaping Use: Never used  Substance and Sexual Activity  . Alcohol use: No  . Drug use: No  . Sexual activity: Not on file  Other Topics Concern  . Not on file  Social History Narrative  . Not on file   Social Determinants of Health   Financial Resource Strain: Not on file  Food Insecurity: Not on file  Transportation Needs: Not on file  Physical Activity: Not on file  Stress: Not on file  Social Connections: Not on file  Intimate Partner Violence: Not on file    FAMILY  HISTORY: Family History  Problem Relation Age of Onset  . Thyroid disease Mother   . Cancer Father   . Diabetes Cousin     ALLERGIES:  has No Known Allergies.  MEDICATIONS:  Current Outpatient Medications  Medication Sig Dispense Refill  . B Complex-C-E-Zn (B COMPLEX-C-E-ZINC) tablet Take 1 tablet by mouth daily.    . Cholecalciferol (VITAMIN D3) 5000 UNITS CAPS Take 5,000 Units by mouth once a week.     Nyoka Cowden Tea, Camellia sinensis, (GREEN TEA EXTRACT  PO) Take 1 tablet by mouth.    . meloxicam (MOBIC) 15 MG tablet Take 15 mg by mouth daily.    . Nutritional Supplements (HI-CAL) LIQD Take by mouth.    . Omega-3 Fatty Acids (FISH OIL) 1000 MG CAPS Take by mouth.    . raloxifene (EVISTA) 60 MG tablet Take 1 tablet (60 mg total) by mouth daily. 90 tablet 1  . rosuvastatin (CRESTOR) 10 MG tablet TAKE 1 TABLET BY MOUTH EVERY DAY 90 tablet 1  . SIMBRINZA 1-0.2 % SUSP Place 1 drop into both eyes 2 (two) times daily.    [name] thyroid (ARMOUR THYROID) 90 MG tablet TAKE 1/2 TABLET BY MOUTH DAILY, NOT ON SUNDAY 36 tablet 2  . XIIDRA 5 % SOLN Apply 1 drop to eye 2 (two) times daily.     No current facility-administered medications for this visit.    REVIEW OF SYSTEMS:   10 Point review of Systems was done is negative except as noted above.   PHYSICAL EXAMINATION: ECOG PERFORMANCE STATUS: 0 - Asymptomatic  Vitals:   10/11/20 1152  BP: (!) 102/58  Pulse: 60  Resp: 13  Temp: 97.8 F (36.6 C)  SpO2: 100%   Filed Weights   10/11/20 1152  Weight: 116 lb 4.8 oz (52.8 kg)   .Body mass index is 21.27 kg/m.    GENERAL:alert, in no acute distress and comfortable SKIN: no acute rashes, no significant lesions EYES: conjunctiva are pink and non-injected, sclera anicteric OROPHARYNX: MMM, no exudates, no oropharyngeal erythema or ulceration NECK: supple, no JVD LYMPH:  no palpable lymphadenopathy in the cervical, axillary or inguinal regions LUNGS: clear to auscultation b/l with normal respiratory effort HEART: regular rate & rhythm ABDOMEN:  normoactive bowel sounds , non tender, not distended. Extremity: no pedal edema PSYCH: alert & oriented x 3 with fluent speech NEURO: no focal motor/sensory deficits   LABORATORY DATA:  I have reviewed the data as listed  . CBC Latest Ref Rng & Units 10/11/2020 08/03/2020 05/10/2020  WBC 4.0 - 10.5 K/uL 43.1(H) 53.0(HH) 48.1 Repeated and verified X2.(HH)  Hemoglobin 12.0 - 15.0 g/dL 12.0 13.8 12.9   Hematocrit 36.0 - 46.0 % 38.2 42.2 39.6  Platelets 150 - 400 K/uL 163 193 183.0   . CBC    Component Value Date/Time   WBC 43.1 (H) 10/11/2020 1130   RBC 4.08 10/11/2020 1130   HGB 12.0 10/11/2020 1130   HGB 13.7 12/13/2017 1336   HCT 38.2 10/11/2020 1130   PLT 163 10/11/2020 1130   PLT 197 12/13/2017 1336   MCV 93.6 10/11/2020 1130   MCH 29.4 10/11/2020 1130   MCHC 31.4 10/11/2020 1130   RDW 14.6 10/11/2020 1130   LYMPHSABS 37.9 (H) 10/11/2020 1130   MONOABS 0.7 10/11/2020 1130   EOSABS 0.2 10/11/2020 1130   BASOSABS 0.1 10/11/2020 1130     . CMP Latest Ref Rng & Units 10/11/2020 08/03/2020 05/10/2020  Glucose 70 - 99 mg/dL 105(H) 117(H) 109(H)  BUN 8 - 23 mg/dL 25(H) 11 19  Creatinine 0.44 - 1.00 mg/dL 0.98 0.73 1.21(H)  Sodium 135 - 145 mmol/L 138 135 140  Potassium 3.5 - 5.1 mmol/L 4.3 4.7 3.8  Chloride 98 - 111 mmol/L 104 102 103  CO2 22 - 32 mmol/L 26 23 31   Calcium 8.9 - 10.3 mg/dL 9.2 9.3 9.4  Total Protein 6.5 - 8.1 g/dL 6.5 7.5 6.5  Total Bilirubin 0.3 - 1.2 mg/dL 0.4 0.5 0.3  Alkaline Phos 38 - 126 U/L 35(L) 38 33(L)  AST 15 - 41 U/L 23 33 21  ALT 0 - 44 U/L 17 19 14      Component     Latest Ref Rng & Units 08/03/2017  Kappa free light chain     3.3 - 19.4 mg/L 15.0  Lamda free light chains     5.7 - 26.3 mg/L 13.0  Kappa, lamda light chain ratio     0.26 - 1.65 1.15  LDH     125 - 245 U/L 173       FISH:     RADIOGRAPHIC STUDIES:  I have personally reviewed the radiological images as listed and agreed with the findings in the report. No results found.  ASSESSMENT & PLAN:   75 y.o. 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 No constitutional symptoms. Newly noted - incidentally on annual labs. No older labs available for reference.  FISH Prognostic panel revealed a monoallelic 25O deletion which  might  suggest a more indolent course for her CLL  11/19/17 CT C/A/P revealed no changes in spleen size and no overtly pathologic adenopathy is identified. One upper normal sized lymph node is a 9 mm in short axis left axillary lymph node.   . Lab Results  Component Value Date   LDH 146 04/13/2020   PLAN: -Discussed pt labwork today, 10/11/2020; WBC improved, no anemia, Plt normal. -Advised pt that WBC fluctuate in the setting of acute infections.  -No lab or clinic evidence of significant CLL progression at this time. Will continue watchful observation.  -Recommended that the pt continue to eat well, drink at least 48-64 oz of water each day, and walk 20-30 minutes each day.  -Goal Vitamin D between 60-90. 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 -Will see back in 6 months with labs.   .3) . Patient Active Problem List   Diagnosis Date Noted  . Hypercholesterolemia 05/02/2018  . Age related osteoporosis 08/04/2016  . 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   FOLLOW UP: RTC with Dr Irene Limbo with labs in 6 months    The total time spent in the appointment was 20 minutes and more than 50% was on counseling and direct patient cares.   All of the patient's questions were answered with apparent satisfaction. The patient knows to call the clinic with any problems, questions or concerns.    Sullivan Lone MD [place] AAHIVMS [facility] Hematology/Oncology Physician [facility]  (Office):       [phone] (Work cell):  [phone] (Fax):           [phone]  10/11/2020 12:14 PM  I, Reinaldo Raddle, am acting as scribe for Dr. Sullivan Lone, MD.      .I have reviewed the above documentation for accuracy and completeness, and I agree with the  above. .Brunetta Genera MD

## 2020-10-11 ENCOUNTER — Inpatient Hospital Stay: Payer: Medicare HMO | Admitting: Hematology

## 2020-10-11 ENCOUNTER — Other Ambulatory Visit: Payer: Self-pay

## 2020-10-11 ENCOUNTER — Inpatient Hospital Stay: Payer: Medicare HMO | Attending: Hematology

## 2020-10-11 VITALS — BP 102/58 | HR 60 | Temp 97.8°F | Resp 13 | Ht 62.0 in | Wt 116.3 lb

## 2020-10-11 DIAGNOSIS — Z8616 Personal history of COVID-19: Secondary | ICD-10-CM | POA: Insufficient documentation

## 2020-10-11 DIAGNOSIS — C9110 Chronic lymphocytic leukemia of B-cell type not having achieved remission: Secondary | ICD-10-CM | POA: Diagnosis not present

## 2020-10-11 LAB — CBC WITH DIFFERENTIAL/PLATELET
Abs Immature Granulocytes: 0.06 10*3/uL (ref 0.00–0.07)
Basophils Absolute: 0.1 10*3/uL (ref 0.0–0.1)
Basophils Relative: 0 %
Eosinophils Absolute: 0.2 10*3/uL (ref 0.0–0.5)
Eosinophils Relative: 1 %
HCT: 38.2 % (ref 36.0–46.0)
Hemoglobin: 12 g/dL (ref 12.0–15.0)
Immature Granulocytes: 0 %
Lymphocytes Relative: 87 %
Lymphs Abs: 37.9 10*3/uL — ABNORMAL HIGH (ref 0.7–4.0)
MCH: 29.4 pg (ref 26.0–34.0)
MCHC: 31.4 g/dL (ref 30.0–36.0)
MCV: 93.6 fL (ref 80.0–100.0)
Monocytes Absolute: 0.7 10*3/uL (ref 0.1–1.0)
Monocytes Relative: 2 %
Neutro Abs: 4.2 10*3/uL (ref 1.7–7.7)
Neutrophils Relative %: 10 %
Platelets: 163 10*3/uL (ref 150–400)
RBC: 4.08 MIL/uL (ref 3.87–5.11)
RDW: 14.6 % (ref 11.5–15.5)
WBC: 43.1 10*3/uL — ABNORMAL HIGH (ref 4.0–10.5)
nRBC: 0 % (ref 0.0–0.2)

## 2020-10-11 LAB — LACTATE DEHYDROGENASE: LDH: 129 U/L (ref 98–192)

## 2020-10-11 LAB — CMP (CANCER CENTER ONLY)
ALT: 17 U/L (ref 0–44)
AST: 23 U/L (ref 15–41)
Albumin: 3.8 g/dL (ref 3.5–5.0)
Alkaline Phosphatase: 35 U/L — ABNORMAL LOW (ref 38–126)
Anion gap: 8 (ref 5–15)
BUN: 25 mg/dL — ABNORMAL HIGH (ref 8–23)
CO2: 26 mmol/L (ref 22–32)
Calcium: 9.2 mg/dL (ref 8.9–10.3)
Chloride: 104 mmol/L (ref 98–111)
Creatinine: 0.98 mg/dL (ref 0.44–1.00)
GFR, Estimated: >60 mL/min (ref >60)
Glucose, Bld: 105 mg/dL — ABNORMAL HIGH (ref 70–99)
Potassium: 4.3 mmol/L (ref 3.5–5.1)
Sodium: 138 mmol/L (ref 135–145)
Total Bilirubin: 0.4 mg/dL (ref 0.3–1.2)
Total Protein: 6.5 g/dL (ref 6.5–8.1)

## 2020-10-12 DIAGNOSIS — H26491 Other secondary cataract, right eye: Secondary | ICD-10-CM | POA: Diagnosis not present

## 2020-11-16 DIAGNOSIS — H04123 Dry eye syndrome of bilateral lacrimal glands: Secondary | ICD-10-CM | POA: Diagnosis not present

## 2020-12-21 ENCOUNTER — Other Ambulatory Visit: Payer: Self-pay | Admitting: Family Medicine

## 2020-12-21 DIAGNOSIS — Z79899 Other long term (current) drug therapy: Secondary | ICD-10-CM | POA: Diagnosis not present

## 2020-12-21 DIAGNOSIS — C9110 Chronic lymphocytic leukemia of B-cell type not having achieved remission: Secondary | ICD-10-CM | POA: Diagnosis not present

## 2020-12-21 DIAGNOSIS — Z8616 Personal history of COVID-19: Secondary | ICD-10-CM | POA: Diagnosis not present

## 2020-12-21 DIAGNOSIS — C8580 Other specified types of non-Hodgkin lymphoma, unspecified site: Secondary | ICD-10-CM | POA: Diagnosis not present

## 2020-12-21 DIAGNOSIS — Z1231 Encounter for screening mammogram for malignant neoplasm of breast: Secondary | ICD-10-CM

## 2021-01-04 ENCOUNTER — Other Ambulatory Visit: Payer: Self-pay | Admitting: Endocrinology

## 2021-01-04 DIAGNOSIS — R0789 Other chest pain: Secondary | ICD-10-CM | POA: Diagnosis not present

## 2021-01-04 DIAGNOSIS — M25562 Pain in left knee: Secondary | ICD-10-CM | POA: Diagnosis not present

## 2021-01-04 DIAGNOSIS — M545 Low back pain, unspecified: Secondary | ICD-10-CM | POA: Diagnosis not present

## 2021-01-05 DIAGNOSIS — M545 Low back pain, unspecified: Secondary | ICD-10-CM | POA: Diagnosis not present

## 2021-01-06 ENCOUNTER — Ambulatory Visit
Admission: RE | Admit: 2021-01-06 | Discharge: 2021-01-06 | Disposition: A | Discharge status: Home / Self Care | Payer: Medicare HMO | Source: Ambulatory Visit | Attending: Orthopedic Surgery | Admitting: Orthopedic Surgery

## 2021-01-06 ENCOUNTER — Other Ambulatory Visit: Payer: Self-pay | Admitting: Orthopedic Surgery

## 2021-01-06 DIAGNOSIS — R0789 Other chest pain: Secondary | ICD-10-CM

## 2021-01-06 DIAGNOSIS — R079 Chest pain, unspecified: Secondary | ICD-10-CM | POA: Diagnosis not present

## 2021-01-12 DIAGNOSIS — Z85828 Personal history of other malignant neoplasm of skin: Secondary | ICD-10-CM | POA: Diagnosis not present

## 2021-01-12 DIAGNOSIS — L814 Other melanin hyperpigmentation: Secondary | ICD-10-CM | POA: Diagnosis not present

## 2021-01-12 DIAGNOSIS — L570 Actinic keratosis: Secondary | ICD-10-CM | POA: Diagnosis not present

## 2021-01-12 DIAGNOSIS — L821 Other seborrheic keratosis: Secondary | ICD-10-CM | POA: Diagnosis not present

## 2021-01-12 DIAGNOSIS — D1801 Hemangioma of skin and subcutaneous tissue: Secondary | ICD-10-CM | POA: Diagnosis not present

## 2021-01-12 DIAGNOSIS — L578 Other skin changes due to chronic exposure to nonionizing radiation: Secondary | ICD-10-CM | POA: Diagnosis not present

## 2021-01-27 ENCOUNTER — Other Ambulatory Visit: Payer: Self-pay

## 2021-01-27 DIAGNOSIS — C9110 Chronic lymphocytic leukemia of B-cell type not having achieved remission: Secondary | ICD-10-CM

## 2021-01-28 DIAGNOSIS — M25562 Pain in left knee: Secondary | ICD-10-CM | POA: Diagnosis not present

## 2021-01-31 ENCOUNTER — Telehealth: Payer: Self-pay | Admitting: *Deleted

## 2021-01-31 ENCOUNTER — Other Ambulatory Visit: Payer: Self-pay

## 2021-01-31 ENCOUNTER — Inpatient Hospital Stay: Payer: Medicare HMO | Attending: Hematology

## 2021-01-31 DIAGNOSIS — C9110 Chronic lymphocytic leukemia of B-cell type not having achieved remission: Secondary | ICD-10-CM | POA: Insufficient documentation

## 2021-01-31 LAB — CBC WITH DIFFERENTIAL (CANCER CENTER ONLY)
Abs Immature Granulocytes: 0.09 10*3/uL — ABNORMAL HIGH (ref 0.00–0.07)
Basophils Absolute: 0.2 10*3/uL — ABNORMAL HIGH (ref 0.0–0.1)
Basophils Relative: 0 %
Eosinophils Absolute: 0.2 10*3/uL (ref 0.0–0.5)
Eosinophils Relative: 0 %
HCT: 39.8 % (ref 36.0–46.0)
Hemoglobin: 12.7 g/dL (ref 12.0–15.0)
Immature Granulocytes: 0 %
Lymphocytes Relative: 93 %
Lymphs Abs: 68.5 10*3/uL — ABNORMAL HIGH (ref 0.7–4.0)
MCH: 30 pg (ref 26.0–34.0)
MCHC: 31.9 g/dL (ref 30.0–36.0)
MCV: 94.1 fL (ref 80.0–100.0)
Monocytes Absolute: 1.4 10*3/uL — ABNORMAL HIGH (ref 0.1–1.0)
Monocytes Relative: 2 %
Neutro Abs: 3.5 10*3/uL (ref 1.7–7.7)
Neutrophils Relative %: 5 %
Platelet Count: 166 10*3/uL (ref 150–400)
RBC: 4.23 MIL/uL (ref 3.87–5.11)
RDW: 14.6 % (ref 11.5–15.5)
WBC Count: 74 10*3/uL (ref 4.0–10.5)
nRBC: 0 % (ref 0.0–0.2)

## 2021-01-31 NOTE — Telephone Encounter (Signed)
PC to patient, no answer left VM to call Dr. Grier Mitts office.  See Dr. Grier Mitts message below:   Plz let patient know wbc counts are uptrending with the CLL but hgb and plt remain WNL. Assuming she is not having any new symptoms we will f/u with her in 2 months as scheduled in September. If she is having new symptoms would plan to rpt CT chest/abd/pelvis and see her sooner. thanks

## 2021-01-31 NOTE — Telephone Encounter (Signed)
CRITICAL VALUE STICKER  CRITICAL VALUE: WBC 74  RECEIVER (on-site recipient of call): L. Cagwin RN  DATE & TIME NOTIFIED: 01/31/21 1434  MESSENGER (representative from lab):Deidre Ala  MD NOTIFIED: Irene Limbo  TIME OF NOTIFICATION:1439  RESPONSE:  MD aware

## 2021-01-31 NOTE — Telephone Encounter (Signed)
CBC results from 01/31/21 faxed to [facility] per request.  Fax confirmation received.

## 2021-02-01 ENCOUNTER — Other Ambulatory Visit: Payer: Self-pay

## 2021-02-01 DIAGNOSIS — C9110 Chronic lymphocytic leukemia of B-cell type not having achieved remission: Secondary | ICD-10-CM

## 2021-02-01 NOTE — Progress Notes (Signed)
Pt contacted per her request. Pt states [facility] MD wanted repeat CBC in one month. Lab appointment made and orders placed. Will fax results to [facility]/ Rosalie Doctor MD Fax #  [phone] with results.

## 2021-02-04 ENCOUNTER — Inpatient Hospital Stay: Payer: Medicare HMO

## 2021-02-08 ENCOUNTER — Telehealth: Payer: Self-pay | Admitting: Hematology

## 2021-02-08 NOTE — Telephone Encounter (Signed)
Called pt to r/s appts per 7/18 sch msg. No answer. Left msg for pt to call back to r/s

## 2021-02-16 ENCOUNTER — Other Ambulatory Visit: Payer: Self-pay

## 2021-02-16 ENCOUNTER — Ambulatory Visit
Admission: RE | Admit: 2021-02-16 | Discharge: 2021-02-16 | Disposition: A | Discharge status: Home / Self Care | Payer: Medicare HMO | Source: Ambulatory Visit | Attending: Family Medicine | Admitting: Family Medicine

## 2021-02-16 DIAGNOSIS — Z1231 Encounter for screening mammogram for malignant neoplasm of breast: Secondary | ICD-10-CM | POA: Diagnosis not present

## 2021-03-03 DIAGNOSIS — H04123 Dry eye syndrome of bilateral lacrimal glands: Secondary | ICD-10-CM | POA: Diagnosis not present

## 2021-03-07 ENCOUNTER — Inpatient Hospital Stay: Payer: Medicare HMO | Attending: Hematology

## 2021-03-07 ENCOUNTER — Other Ambulatory Visit: Payer: Self-pay

## 2021-03-07 DIAGNOSIS — C9110 Chronic lymphocytic leukemia of B-cell type not having achieved remission: Secondary | ICD-10-CM | POA: Insufficient documentation

## 2021-03-07 LAB — CBC WITH DIFFERENTIAL (CANCER CENTER ONLY)
Abs Immature Granulocytes: 0.09 10*3/uL — ABNORMAL HIGH (ref 0.00–0.07)
Basophils Absolute: 0.1 10*3/uL (ref 0.0–0.1)
Basophils Relative: 0 %
Eosinophils Absolute: 0.2 10*3/uL (ref 0.0–0.5)
Eosinophils Relative: 0 %
HCT: 39.1 % (ref 36.0–46.0)
Hemoglobin: 13.1 g/dL (ref 12.0–15.0)
Immature Granulocytes: 0 %
Lymphocytes Relative: 92 %
Lymphs Abs: 53.6 10*3/uL — ABNORMAL HIGH (ref 0.7–4.0)
MCH: 31 pg (ref 26.0–34.0)
MCHC: 33.5 g/dL (ref 30.0–36.0)
MCV: 92.4 fL (ref 80.0–100.0)
Monocytes Absolute: 1.4 10*3/uL — ABNORMAL HIGH (ref 0.1–1.0)
Monocytes Relative: 2 %
Neutro Abs: 3.5 10*3/uL (ref 1.7–7.7)
Neutrophils Relative %: 6 %
Platelet Count: 176 10*3/uL (ref 150–400)
RBC: 4.23 MIL/uL (ref 3.87–5.11)
RDW: 14.6 % (ref 11.5–15.5)
WBC Count: 59 10*3/uL (ref 4.0–10.5)
nRBC: 0 % (ref 0.0–0.2)

## 2021-03-07 NOTE — Progress Notes (Unsigned)
CRITICAL VALUE STICKER  CRITICAL VALUE: WBC 59.0  DATE & TIME NOTIFIED: 03/07/21 1:27   MD NOTIFIED: Dede Query PA  TIME OF NOTIFICATION: 03/07/21 1:28

## 2021-03-08 ENCOUNTER — Encounter: Payer: Self-pay | Admitting: Hematology

## 2021-03-08 NOTE — Progress Notes (Signed)
03/07/21 labs results faxed to [facility]/ Dr Rebeca Alert at [phone] per their request.

## 2021-03-22 ENCOUNTER — Encounter: Payer: Self-pay | Admitting: Hematology

## 2021-03-22 DIAGNOSIS — R9431 Abnormal electrocardiogram [ECG] [EKG]: Secondary | ICD-10-CM | POA: Diagnosis not present

## 2021-03-22 DIAGNOSIS — R0602 Shortness of breath: Secondary | ICD-10-CM | POA: Diagnosis not present

## 2021-03-23 ENCOUNTER — Telehealth: Payer: Self-pay

## 2021-03-23 NOTE — Telephone Encounter (Signed)
Referral notes received from [facility] family medicine  , Phone #: [phone], Fax #: [phone]   A copy of the notes have been placed in the scheduling box for check-out to pick-up and to enter referral. Original notes placed in file cabinet.

## 2021-03-24 NOTE — Telephone Encounter (Signed)
Patient has been scheduled for 03/25/21 with Dr. Sallyanne Kuster. Please fax notes to [facility].

## 2021-03-24 NOTE — Telephone Encounter (Signed)
Notes have been faxed to Dr. Victorino December office. Confirmation has been received.

## 2021-03-25 ENCOUNTER — Ambulatory Visit: Payer: Medicare HMO | Admitting: Cardiovascular Disease

## 2021-03-25 ENCOUNTER — Encounter: Payer: Self-pay | Admitting: Cardiovascular Disease

## 2021-03-25 ENCOUNTER — Other Ambulatory Visit: Payer: Self-pay

## 2021-03-25 VITALS — BP 100/62 | HR 67 | Ht 63.0 in | Wt 112.4 lb

## 2021-03-25 DIAGNOSIS — C9110 Chronic lymphocytic leukemia of B-cell type not having achieved remission: Secondary | ICD-10-CM

## 2021-03-25 DIAGNOSIS — E890 Postprocedural hypothyroidism: Secondary | ICD-10-CM | POA: Diagnosis not present

## 2021-03-25 DIAGNOSIS — R0602 Shortness of breath: Secondary | ICD-10-CM

## 2021-03-25 DIAGNOSIS — R9431 Abnormal electrocardiogram [ECG] [EKG]: Secondary | ICD-10-CM | POA: Diagnosis not present

## 2021-03-25 DIAGNOSIS — E7800 Pure hypercholesterolemia, unspecified: Secondary | ICD-10-CM

## 2021-03-25 MED ORDER — METOPROLOL TARTRATE 50 MG PO TABS: ORAL_TABLET | ORAL | 0 refills | Status: DC

## 2021-03-25 NOTE — Patient Instructions (Addendum)
Medication Instructions:  No change   Lab Work:  Bmet have done 1 week before coronary ct   Testing/Procedures:  Schedule Echo   Coronary CT  will be scheduled after approved by insurance  Follow instructions below   Follow-Up: At [facility], you and your health needs are our priority.  As part of our continuing mission to provide you with exceptional heart care, we have created designated Provider Care Teams.  These Care Teams include your primary Cardiologist (physician) and Advanced Practice Providers (APPs -  Physician Assistants and Nurse Practitioners) who all work together to provide you with the care you need, when you need it.  We recommend signing up for the patient portal called "MyChart".  Sign up information is provided on this After Visit Summary.  MyChart is used to connect with patients for Virtual Visits (Telemedicine).  Patients are able to view lab/test results, encounter notes, upcoming appointments, etc.  Non-urgent messages can be sent to your provider as well.   To learn more about what you can do with MyChart, go to [email or web address].     Your next appointment:  After test   The format for your next appointment: Office   Provider:  Dr.Croitotu      Your cardiac CT will be scheduled at one of the below locations:   [facility] [address] [place], [place] [address] [phone]  [place] [address] [place],  [address] [phone]  If scheduled at [facility], please arrive at the [facility] main entrance (entrance A) of [facility] 30 minutes prior to test start time. Proceed to the [facility] Radiology Department (first floor) to check-in and test prep.  If scheduled at [facility], please arrive 15 mins early for check-in and test prep.  Please follow these instructions carefully (unless otherwise  directed):    On the Night Before the Test: Be sure to Drink plenty of water. Do not consume any caffeinated/decaffeinated beverages or chocolate 12 hours prior to your test. Do not take any antihistamines 12 hours prior to your test.   On the Day of the Test: Drink plenty of water until 1 hour prior to the test. Do not eat any food 4 hours prior to the test. You may take your regular medications prior to the test.  Take metoprolol  50 mg two hours prior to test. FEMALES- please wear underwire-free bra if available, avoid dresses & tight clothing           After the Test: Drink plenty of water. After receiving IV contrast, you may experience a mild flushed feeling. This is normal. On occasion, you may experience a mild rash up to 24 hours after the test. This is not dangerous. If this occurs, you can take Benadryl 25 mg and increase your fluid intake. If you experience trouble breathing, this can be serious. If it is severe call 911 IMMEDIATELY. If it is mild, please call our office.  Please allow 2-4 weeks for scheduling of routine cardiac CTs. Some insurance companies require a pre-authorization which may delay scheduling of this test.   For non-scheduling related questions, please contact the cardiac imaging nurse navigator should you have any questions/concerns: Marchia Bond, Cardiac Imaging Nurse Navigator Gordy Clement, Cardiac Imaging Nurse Navigator [facility] Direct Office Dial: [phone]   For scheduling needs, including cancellations and rescheduling, please call [place], [phone].

## 2021-03-25 NOTE — Progress Notes (Signed)
Cardiology Office Note:    Date:  03/26/2021   ID:  Nigel Berthold, DOB 07-Jul-1946, MRN [id number]  PCP:  Orpah Melter, MD   [place] Providers Cardiologist:  None     Referring MD: Orpah Melter, MD   No chief complaint on file. Samantha Graham is a 75 y.o. female who is being seen today for the evaluation of dyspnea and abnormal ECG at the request of Orpah Melter, MD.   History of Present Illness:    Samantha Graham is a 75 y.o. female with longstanding CLL, but otherwise with a hx of remarkably good health (treated hypercholesterolemia and hypothyroidism following surgical treatment for Graves' disease) who has had intermittent problems with dyspnea since she had COVID-19 infection in March 2022.  She has dyspnea and fatigue associated with activities that used to be very easy for her.  She still goes to yoga and stretching classes and even some Pilates, but feels exhausted after she is done.  She has noticed that her heart rate increases rapidly with exercise, which was not the case before she had a viral infection.  She does not have chest tightness or pain with activity.  She denies orthopnea, PND, palpitations, syncope, leg edema, intermittent claudication or focal neurological complaints.  Due to her complaints of dyspnea she underwent an electrocardiogram.  Surprisingly, there are no older tracings available for review.  The ECG from 03/22/2021 shows normal sinus rhythm, T wave inversion across the anterior precordium and generalized low voltage.  The QTC is normal at 416 ms.  The electrocardiogram performed in our office today is very similar.  It shows normal sinus rhythm, generalized low voltage, T wave inversion in V1-V4 as well as in lead III and lead aVF, QTC 437 ms.  Her CLL was first diagnosed in 2019 and is associated with impressively high white blood cell counts usually in the 50-60000 range, but she has not had anemia or thrombocytopenia or any need  for treatment so far.  BNP performed on 08 30 was normal at 88.  Most recent creatinine from October 11, 2020 was 0.98.  She took Paxlovid in March and had side effects from it.  Evaluation in the emergency room at that time showed normal chest x-ray and normal creatinine kinase as well as normal routine labs.  She has never smoked.  She has been taking a statin for the last roughly 4 years.  Her most recent LDL was excellent at 69 and she has a good HDL at 55.  Her mother lived to be 24.  Her father did have a AAA and a pacemaker and an enlarged heart, but he lived to his mid 3s.  She has a brother who has rhythm problems.  Past Medical History:  Diagnosis Date   Hyperlipidemia     Past Surgical History:  Procedure Laterality Date   THYROIDECTOMY  1985    Current Medications: Current Meds  Medication Sig   B Complex-C-E-Zn (B COMPLEX-C-E-ZINC) tablet Take 1 tablet by mouth daily.   Cholecalciferol (VITAMIN D3) 5000 UNITS CAPS Take 5,000 Units by mouth once.   meloxicam (MOBIC) 15 MG tablet Take 15 mg by mouth daily.   metoprolol tartrate (LOPRESSOR) 50 MG tablet Take 50 mg 2 hours before coronary ct   Nutritional Supplements (HI-CAL) LIQD Take by mouth.   Omega-3 Fatty Acids (FISH OIL) 1000 MG CAPS Take by mouth.   raloxifene (EVISTA) 60 MG tablet Take 1 tablet (60 mg total) by mouth daily.  rosuvastatin (CRESTOR) 10 MG tablet TAKE 1 TABLET BY MOUTH EVERY DAY   thyroid (ARMOUR THYROID) 90 MG tablet TAKE 1/2 TABLET BY MOUTH DAILY, NOT ON SUNDAY   XIIDRA 5 % SOLN Apply 1 drop to eye 2 (two) times daily.     Allergies:   Patient has no known allergies.   Social History   Socioeconomic History   Marital status: Married    Spouse name: Not on file   Number of children: Not on file   Years of education: Not on file   Highest education level: Not on file  Occupational History   Not on file  Tobacco Use   Smoking status: Never   Smokeless tobacco: Never  Vaping Use   Vaping  Use: Never used  Substance and Sexual Activity   Alcohol use: No   Drug use: No   Sexual activity: Not on file  Other Topics Concern   Not on file  Social History Narrative   Not on file   Social Determinants of Health   Financial Resource Strain: Not on file  Food Insecurity: Not on file  Transportation Needs: Not on file  Physical Activity: Not on file  Stress: Not on file  Social Connections: Not on file     Family History: The patient's family history includes Cancer in her father; Diabetes in her cousin; Thyroid disease in her mother.  ROS:   Please see the history of present illness.     All other systems reviewed and are negative.  EKGs/Labs/Other Studies Reviewed:    The following studies were reviewed today: Record of brief hospitalization in March 2022, records from primary care provider including ECG and labs.  EKG:  EKG is  ordered today.  The ekg ordered today demonstrates normal sinus rhythm, generalized low voltage, T wave inversion leads V1-V4, 3, aVF, normal QTC at 437 ms.  It looks similar to the tracing from 03/22/2021.  Recent Labs: 05/10/2020: TSH 1.65 10/11/2020: ALT 17; BUN 25; Creatinine 0.98; Potassium 4.3; Sodium 138 03/07/2021: Hemoglobin 13.1; Platelet Count 176  Recent Lipid Panel    Component Value Date/Time   CHOL 147 05/10/2020 1415   TRIG 115.0 05/10/2020 1415   HDL 55.60 05/10/2020 1415   CHOLHDL 3 05/10/2020 1415   VLDL 23.0 05/10/2020 1415   LDLCALC 69 05/10/2020 1415     Risk Assessment/Calculations:           Physical Exam:    VS:  BP 100/62   Pulse 67   Ht '5\' 3"'$  (1.6 m)   Wt 112 lb 6.4 oz (51 kg)   SpO2 95%   BMI 19.91 kg/m     Wt Readings from Last 3 Encounters:  03/25/21 112 lb 6.4 oz (51 kg)  10/11/20 116 lb 4.8 oz (52.8 kg)  05/13/20 115 lb (52.2 kg)     GEN: Lean, well nourished, well developed in no acute distress HEENT: Normal NECK: No JVD; No carotid bruits LYMPHATICS: No lymphadenopathy CARDIAC:  RRR, no murmurs, rubs, gallops RESPIRATORY:  Clear to auscultation without rales, wheezing or rhonchi  ABDOMEN: Soft, non-tender, non-distended MUSCULOSKELETAL:  No edema; No deformity  SKIN: Warm and dry NEUROLOGIC:  Alert and oriented x 3 PSYCHIATRIC:  Normal affect   ASSESSMENT:    1. SOB (shortness of breath)   2. Nonspecific abnormal electrocardiogram (ECG) (EKG)   3. Hypercholesterolemia   4. CLL (chronic lymphocytic leukemia) ([place])   5. Postsurgical hypothyroidism    PLAN:    In order  of problems listed above:  Exertional dyspnea: Check echocardiogram.  Considering the ECG findings, this could be an anginal equivalent.  May also represent long COVID.  Normal chest x-ray 01/06/2021.  Normal BNP a couple of days ago. Abnormal ECG: Stable when compared to the one performed 03/22/2021, but unfortunately no older tracings are available for review in any of the databases that are available to [place].  Recommend coronary CT angiogram.  Risk factors include known hypercholesterolemia, father with atherosclerotic disease (AAA), but otherwise she does not have a lot of risk factors. HLP: On statin with great LDL. CLL: Despite markedly elevated lymphocyte count, does not have anemia, thrombocytopenia or other symptoms of hematological malignancy.  Need to keep in mind that these could be be B symptoms from the CLL. Hypothyroidism: On appropriate supplementation with normal TSH.        Medication Adjustments/Labs and Tests Ordered: Current medicines are reviewed at length with the patient today.  Concerns regarding medicines are outlined above.  Orders Placed This Encounter  Procedures   CT CORONARY MORPH W/CTA COR W/SCORE W/CA W/CM &/OR WO/CM   Basic metabolic panel   EKG [id number]   ECHOCARDIOGRAM COMPLETE   Meds ordered this encounter  Medications   metoprolol tartrate (LOPRESSOR) 50 MG tablet    Sig: Take 50 mg 2 hours before coronary ct    Dispense:  1 tablet    Refill:  0     Patient Instructions  Medication Instructions:  No change   Lab Work:  Bmet have done 1 week before coronary ct   Testing/Procedures:  Schedule Echo   Coronary CT  will be scheduled after approved by insurance  Follow instructions below   Follow-Up: At [facility], you and your health needs are our priority.  As part of our continuing mission to provide you with exceptional heart care, we have created designated Provider Care Teams.  These Care Teams include your primary Cardiologist (physician) and Advanced Practice Providers (APPs -  Physician Assistants and Nurse Practitioners) who all work together to provide you with the care you need, when you need it.  We recommend signing up for the patient portal called "MyChart".  Sign up information is provided on this After Visit Summary.  MyChart is used to connect with patients for Virtual Visits (Telemedicine).  Patients are able to view lab/test results, encounter notes, upcoming appointments, etc.  Non-urgent messages can be sent to your provider as well.   To learn more about what you can do with MyChart, go to [email or web address].     Your next appointment:  After test   The format for your next appointment: Office   Provider:  Dr.Croitotu      Your cardiac CT will be scheduled at one of the below locations:   [facility] [address] [place], [place] [address] [phone]  [facility] [address] [place], [place] [address] [phone]  If scheduled at [facility], please arrive at the [facility] main entrance (entrance A) of [facility] 30 minutes prior to test start time. Proceed to the [facility] Radiology Department (first floor) to check-in and test prep.  If scheduled at [facility], please arrive 15 mins early for check-in and test prep.  Please follow these instructions  carefully (unless otherwise directed):    On the Night Before the Test: Be sure to Drink plenty of water. Do not consume any caffeinated/decaffeinated beverages  or chocolate 12 hours prior to your test. Do not take any antihistamines 12 hours prior to your test.   On the Day of the Test: Drink plenty of water until 1 hour prior to the test. Do not eat any food 4 hours prior to the test. You may take your regular medications prior to the test.  Take metoprolol  50 mg two hours prior to test. FEMALES- please wear underwire-free bra if available, avoid dresses & tight clothing           After the Test: Drink plenty of water. After receiving IV contrast, you may experience a mild flushed feeling. This is normal. On occasion, you may experience a mild rash up to 24 hours after the test. This is not dangerous. If this occurs, you can take Benadryl 25 mg and increase your fluid intake. If you experience trouble breathing, this can be serious. If it is severe call 911 IMMEDIATELY. If it is mild, please call our office.  Please allow 2-4 weeks for scheduling of routine cardiac CTs. Some insurance companies require a pre-authorization which may delay scheduling of this test.   For non-scheduling related questions, please contact the cardiac imaging nurse navigator should you have any questions/concerns: Marchia Bond, Cardiac Imaging Nurse Navigator Gordy Clement, Cardiac Imaging Nurse Navigator [facility] Direct Office Dial: [phone]   For scheduling needs, including cancellations and rescheduling, please call [place], [phone].    Signed, Sanda Klein, MD  03/26/2021 7:22 PM    [facility]

## 2021-03-29 ENCOUNTER — Other Ambulatory Visit (HOSPITAL_BASED_OUTPATIENT_CLINIC_OR_DEPARTMENT_OTHER): Payer: Self-pay | Admitting: Cardiovascular Disease

## 2021-03-29 DIAGNOSIS — R9431 Abnormal electrocardiogram [ECG] [EKG]: Secondary | ICD-10-CM

## 2021-03-29 DIAGNOSIS — R0602 Shortness of breath: Secondary | ICD-10-CM

## 2021-03-31 ENCOUNTER — Other Ambulatory Visit: Payer: Self-pay | Admitting: Endocrinology

## 2021-03-31 ENCOUNTER — Ambulatory Visit (INDEPENDENT_AMBULATORY_CARE_PROVIDER_SITE_OTHER): Payer: Medicare HMO

## 2021-03-31 ENCOUNTER — Other Ambulatory Visit: Payer: Self-pay

## 2021-03-31 DIAGNOSIS — R9431 Abnormal electrocardiogram [ECG] [EKG]: Secondary | ICD-10-CM

## 2021-03-31 DIAGNOSIS — R0602 Shortness of breath: Secondary | ICD-10-CM | POA: Diagnosis not present

## 2021-03-31 LAB — ECHOCARDIOGRAM COMPLETE
Area-P 1/2: 3.99 cm2
Calc EF: 68.7 %
MV M vel: 3.32 m/s
MV Peak grad: 44.1 mmHg
S' Lateral: 2.24 cm
Single Plane A2C EF: 69.7 %
Single Plane A4C EF: 67.3 %

## 2021-04-04 ENCOUNTER — Telehealth (HOSPITAL_COMMUNITY): Payer: Self-pay | Admitting: Emergency Medicine

## 2021-04-04 ENCOUNTER — Other Ambulatory Visit (HOSPITAL_BASED_OUTPATIENT_CLINIC_OR_DEPARTMENT_OTHER): Payer: Medicare HMO

## 2021-04-04 NOTE — Telephone Encounter (Signed)
Reaching out to patient to offer assistance regarding upcoming cardiac imaging study; pt verbalizes understanding of appt date/time, parking situation and where to check in, pre-test NPO status and medications ordered, and verified current allergies; name and call back number provided for further questions should they arise Marchia Bond RN Navigator Cardiac Imaging [facility] [phone] office [phone] cell  Pt getting labs tomorrow 9/13 Denies iv issues Denies claustro '50mg'$  metop 2 hr prior

## 2021-04-05 DIAGNOSIS — R0602 Shortness of breath: Secondary | ICD-10-CM | POA: Diagnosis not present

## 2021-04-05 DIAGNOSIS — R9431 Abnormal electrocardiogram [ECG] [EKG]: Secondary | ICD-10-CM | POA: Diagnosis not present

## 2021-04-05 LAB — BASIC METABOLIC PANEL
BUN/Creatinine Ratio: 21 (ref 12–28)
BUN: 19 mg/dL (ref 8–27)
CO2: 24 mmol/L (ref 20–29)
Calcium: 9.4 mg/dL (ref 8.7–10.3)
Chloride: 104 mmol/L (ref 96–106)
Creatinine, Ser: 0.9 mg/dL (ref 0.57–1.00)
Glucose: 77 mg/dL (ref 65–99)
Potassium: 4.6 mmol/L (ref 3.5–5.2)
Sodium: 142 mmol/L (ref 134–144)
eGFR: 67 mL/min/{1.73_m2} (ref >59)

## 2021-04-07 ENCOUNTER — Ambulatory Visit (HOSPITAL_COMMUNITY)
Admission: RE | Admit: 2021-04-07 | Discharge: 2021-04-07 | Disposition: A | Discharge status: Home / Self Care | Payer: Medicare HMO | Source: Ambulatory Visit | Attending: Cardiovascular Disease | Admitting: Cardiovascular Disease

## 2021-04-07 ENCOUNTER — Other Ambulatory Visit: Payer: Self-pay

## 2021-04-07 DIAGNOSIS — R0602 Shortness of breath: Secondary | ICD-10-CM | POA: Diagnosis present

## 2021-04-07 DIAGNOSIS — R9431 Abnormal electrocardiogram [ECG] [EKG]: Secondary | ICD-10-CM | POA: Insufficient documentation

## 2021-04-07 MED ORDER — NITROGLYCERIN 0.4 MG SL SUBL
SUBLINGUAL_TABLET | SUBLINGUAL | Status: AC
  Filled 2021-04-07: qty 2

## 2021-04-07 MED ORDER — NITROGLYCERIN 0.4 MG SL SUBL
0.8000 mg | SUBLINGUAL_TABLET | Freq: Once | SUBLINGUAL | Status: AC
  Administered 2021-04-07: 0.8 mg via SUBLINGUAL

## 2021-04-07 MED ORDER — IOHEXOL 350 MG/ML SOLN
95.0000 mL | Freq: Once | INTRAVENOUS | Status: AC | PRN
  Administered 2021-04-07: 95 mL via INTRAVENOUS

## 2021-04-08 ENCOUNTER — Other Ambulatory Visit: Payer: Self-pay | Admitting: *Deleted

## 2021-04-08 DIAGNOSIS — R0602 Shortness of breath: Secondary | ICD-10-CM

## 2021-04-13 ENCOUNTER — Inpatient Hospital Stay: Payer: Medicare HMO | Admitting: Hematology

## 2021-04-13 ENCOUNTER — Telehealth: Payer: Self-pay

## 2021-04-13 ENCOUNTER — Other Ambulatory Visit: Payer: Self-pay

## 2021-04-13 ENCOUNTER — Inpatient Hospital Stay: Payer: Medicare HMO | Attending: Hematology

## 2021-04-13 VITALS — BP 101/56 | HR 64 | Temp 96.3°F | Resp 17 | Wt 114.8 lb

## 2021-04-13 DIAGNOSIS — C9110 Chronic lymphocytic leukemia of B-cell type not having achieved remission: Secondary | ICD-10-CM

## 2021-04-13 LAB — CBC WITH DIFFERENTIAL/PLATELET
Abs Immature Granulocytes: 0.09 10*3/uL — ABNORMAL HIGH (ref 0.00–0.07)
Basophils Absolute: 0.1 10*3/uL (ref 0.0–0.1)
Basophils Relative: 0 %
Eosinophils Absolute: 0.2 10*3/uL (ref 0.0–0.5)
Eosinophils Relative: 0 %
HCT: 38.4 % (ref 36.0–46.0)
Hemoglobin: 12.6 g/dL (ref 12.0–15.0)
Immature Granulocytes: 0 %
Lymphocytes Relative: 90 %
Lymphs Abs: 51.2 10*3/uL — ABNORMAL HIGH (ref 0.7–4.0)
MCH: 30.4 pg (ref 26.0–34.0)
MCHC: 32.8 g/dL (ref 30.0–36.0)
MCV: 92.8 fL (ref 80.0–100.0)
Monocytes Absolute: 1.7 10*3/uL — ABNORMAL HIGH (ref 0.1–1.0)
Monocytes Relative: 3 %
Neutro Abs: 4 10*3/uL (ref 1.7–7.7)
Neutrophils Relative %: 7 %
Platelets: 158 10*3/uL (ref 150–400)
RBC: 4.14 MIL/uL (ref 3.87–5.11)
RDW: 14 % (ref 11.5–15.5)
WBC: 57.4 10*3/uL (ref 4.0–10.5)
nRBC: 0 % (ref 0.0–0.2)

## 2021-04-13 LAB — CMP (CANCER CENTER ONLY)
ALT: 16 U/L (ref 0–44)
AST: 25 U/L (ref 15–41)
Albumin: 3.9 g/dL (ref 3.5–5.0)
Alkaline Phosphatase: 38 U/L (ref 38–126)
Anion gap: 9 (ref 5–15)
BUN: 23 mg/dL (ref 8–23)
CO2: 27 mmol/L (ref 22–32)
Calcium: 9.4 mg/dL (ref 8.9–10.3)
Chloride: 106 mmol/L (ref 98–111)
Creatinine: 1.3 mg/dL — ABNORMAL HIGH (ref 0.44–1.00)
GFR, Estimated: 43 mL/min — ABNORMAL LOW (ref >60)
Glucose, Bld: 98 mg/dL (ref 70–99)
Potassium: 4.7 mmol/L (ref 3.5–5.1)
Sodium: 142 mmol/L (ref 135–145)
Total Bilirubin: 0.4 mg/dL (ref 0.3–1.2)
Total Protein: 6.5 g/dL (ref 6.5–8.1)

## 2021-04-13 LAB — LACTATE DEHYDROGENASE: LDH: 137 U/L (ref 98–192)

## 2021-04-13 NOTE — Telephone Encounter (Signed)
CRITICAL VALUE STICKER  CRITICAL VALUE: WBC = 57.4  RECEIVER (on-site recipient of call): Yetta Glassman, [place] NOTIFIED: 04/13/21 at 1:45p  MESSENGER (representative from lab): Ulice Dash  MD NOTIFIED: Irene Limbo  TIME OF NOTIFICATION: 04/13/21 at 1:50p  RESPONSE: Notification given to Ilda Foil., RN for follow-up with the provider and pt.

## 2021-04-14 ENCOUNTER — Telehealth: Payer: Self-pay | Admitting: Hematology

## 2021-04-14 NOTE — Telephone Encounter (Signed)
Scheduled follow-up appointment per 9/21 los. Patient's husband is aware.

## 2021-04-19 NOTE — Progress Notes (Signed)
HEMATOLOGY/ONCOLOGY CLINIC NOTE  Date of Service: .04/13/2021   Patient Care Team: Orpah Melter, MD as PCP - General (Family Medicine)  CHIEF COMPLAINTS/PURPOSE OF CONSULTATION:  F/u for CLL  HISTORY OF PRESENTING ILLNESS:   Samantha Graham is a wonderful 75 y.o. female who has been referred to [place] by Dr. Orpah Melter, MD for evaluation and management of incidentally noted leucocytosis/Lymphocytosis.  Patient is generally in good overall health and on labs done for her annual physical in 06/2017 on labs was incidentally noted to have leucocytosis of 21.7k with lymphocytosis of 17.7k. Nl hgb of 13.8 and nl platelet count of 200k. She notes no new fatigue, fevers/chills/night sweats, overt LN enlargement, abdominal pain or distension. No recent viral infection. It is unclear if she has been noted to have lymphocytosis prior to these labs (old labs not available to [place] currently).  She was referred to [place] for further evaluation of her lymphocytosis.  She notes she feels no differently than has has felt over the last few years.  Patient notes that she is a non smoker. No other recent chemical exposures. No PRBC transfusions in the past.  INTERVAL HISTORY:  Samantha Graham is a wonderful 75 y.o. female who is following up today regarding her Chronic Lymphocytic Leukemia. The patient's last visit with [place] was on 10/11/2020. The pt reports that she is doing well overall.  The pt reports her chronic back pain issues for which his gets ESI's as needed.  No fevers no chills no night sweats no significant weight loss.  No new lumps or bumps.  Lab results today 04/13/2021 of CBC w/diff shows stably elevated WBC count, normal hemoglobin of 12.6, normal platelets of 158 k  CMP stable  LDH .  62  MEDICAL HISTORY:  Past Medical History:  Diagnosis Date   Hyperlipidemia   Hypothyroidism  Rt frozen shoulder Osteopenia/osteoporosis  SURGICAL HISTORY: Past Surgical History:   Procedure Laterality Date   THYROIDECTOMY  1985  hysterectomy with BSO due to prolpased uterus Bunionectomy Meniscal injury -rt knee Bladder cyst removal  SOCIAL HISTORY: Social History   Socioeconomic History   Marital status: Married    Spouse name: Not on file   Number of children: Not on file   Years of education: Not on file   Highest education level: Not on file  Occupational History   Not on file  Tobacco Use   Smoking status: Never   Smokeless tobacco: Never  Vaping Use   Vaping Use: Never used  Substance and Sexual Activity   Alcohol use: No   Drug use: No   Sexual activity: Not on file  Other Topics Concern   Not on file  Social History Narrative   Not on file   Social Determinants of Health   Financial Resource Strain: Not on file  Food Insecurity: Not on file  Transportation Needs: Not on file  Physical Activity: Not on file  Stress: Not on file  Social Connections: Not on file  Intimate Partner Violence: Not on file    FAMILY HISTORY: Family History  Problem Relation Age of Onset   Thyroid disease Mother    Cancer Father    Diabetes Cousin     ALLERGIES:  has No Known Allergies.  MEDICATIONS:  Current Outpatient Medications  Medication Sig Dispense Refill   B Complex-C-E-Zn (B COMPLEX-C-E-ZINC) tablet Take 1 tablet by mouth daily.     Cholecalciferol (VITAMIN D3) 5000 UNITS CAPS Take 5,000 Units by mouth once.  meloxicam (MOBIC) 15 MG tablet Take 15 mg by mouth daily.     metoprolol tartrate (LOPRESSOR) 50 MG tablet Take 50 mg 2 hours before coronary ct (Patient not taking: Reported on 04/13/2021) 1 tablet 0   Nutritional Supplements (HI-CAL) LIQD Take by mouth.     Omega-3 Fatty Acids (FISH OIL) 1000 MG CAPS Take by mouth.     raloxifene (EVISTA) 60 MG tablet Take 1 tablet (60 mg total) by mouth daily. 90 tablet 1   rosuvastatin (CRESTOR) 10 MG tablet TAKE 1 TABLET BY MOUTH EVERY DAY 90 tablet 1   thyroid (ARMOUR THYROID) 90 MG tablet  TAKE 1/2 TABLET BY MOUTH DAILY, NOT ON SUNDAY 36 tablet 2   XIIDRA 5 % SOLN Apply 1 drop to eye 2 (two) times daily.     No current facility-administered medications for this visit.    REVIEW OF SYSTEMS:   .10 Point review of Systems was done is negative except as noted above.  PHYSICAL EXAMINATION: ECOG PERFORMANCE STATUS: 0 - Asymptomatic  Vitals:   04/13/21 1346  BP: (!) 101/56  Pulse: 64  Resp: 17  Temp: (!) 96.3 F (35.7 C)  SpO2: 98%   Filed Weights   04/13/21 1346  Weight: 114 lb 12.8 oz (52.1 kg)   .Body mass index is 20.34 kg/m.  [name] GENERAL:alert, in no acute distress and comfortable SKIN: no acute rashes, no significant lesions EYES: conjunctiva are pink and non-injected, sclera anicteric OROPHARYNX: MMM, no exudates, no oropharyngeal erythema or ulceration NECK: supple, no JVD LYMPH:  no palpable lymphadenopathy in the cervical, axillary or inguinal regions LUNGS: clear to auscultation b/l with normal respiratory effort HEART: regular rate & rhythm ABDOMEN:  normoactive bowel sounds , non tender, not distended. Extremity: no pedal edema PSYCH: alert & oriented x 3 with fluent speech NEURO: no focal motor/sensory deficits   LABORATORY DATA:  I have reviewed the data as listed  . CBC Latest Ref Rng & Units 04/13/2021 03/07/2021 01/31/2021  WBC 4.0 - 10.5 K/uL 57.4(HH) 59.0(HH) 74.0(HH)  Hemoglobin 12.0 - 15.0 g/dL 12.6 13.1 12.7  Hematocrit 36.0 - 46.0 % 38.4 39.1 39.8  Platelets 150 - 400 K/uL 158 176 166   . CBC    Component Value Date/Time   WBC 57.4 (HH) 04/13/2021 1336   RBC 4.14 04/13/2021 1336   HGB 12.6 04/13/2021 1336   HGB 13.1 03/07/2021 1312   HCT 38.4 04/13/2021 1336   PLT 158 04/13/2021 1336   PLT 176 03/07/2021 1312   MCV 92.8 04/13/2021 1336   MCH 30.4 04/13/2021 1336   MCHC 32.8 04/13/2021 1336   RDW 14.0 04/13/2021 1336   LYMPHSABS 51.2 (H) 04/13/2021 1336   MONOABS 1.7 (H) 04/13/2021 1336   EOSABS 0.2 04/13/2021 1336    BASOSABS 0.1 04/13/2021 1336     . CMP Latest Ref Rng & Units 04/13/2021 04/05/2021 10/11/2020  Glucose 70 - 99 mg/dL 98 77 105(H)  BUN 8 - 23 mg/dL 23 19 25(H)  Creatinine 0.44 - 1.00 mg/dL 1.30(H) 0.90 0.98  Sodium 135 - 145 mmol/L 142 142 138  Potassium 3.5 - 5.1 mmol/L 4.7 4.6 4.3  Chloride 98 - 111 mmol/L 106 104 104  CO2 22 - 32 mmol/L 27 24 26   Calcium 8.9 - 10.3 mg/dL 9.4 9.4 9.2  Total Protein 6.5 - 8.1 g/dL 6.5 - 6.5  Total Bilirubin 0.3 - 1.2 mg/dL 0.4 - 0.4  Alkaline Phos 38 - 126 U/L 38 - 35(L)  AST 15 -  41 U/L 25 - 23  ALT 0 - 44 U/L 16 - 17   . Lab Results  Component Value Date   LDH 137 04/13/2021      Component     Latest Ref Rng & Units 08/03/2017  Kappa free light chain     3.3 - 19.4 mg/L 15.0  Lamda free light chains     5.7 - 26.3 mg/L 13.0  Kappa, lamda light chain ratio     0.26 - 1.65 1.15  LDH     125 - 245 U/L 173       FISH:     RADIOGRAPHIC STUDIES:  I have personally reviewed the radiological images as listed and agreed with the findings in the report. CT CORONARY MORPH W/CTA COR W/SCORE W/CA W/CM &/OR WO/CM  Addendum Date: 04/07/2021   ADDENDUM REPORT: 04/07/2021 15:06 HISTORY: abnormal ekg,sob EXAM: Cardiac/Coronary  CT TECHNIQUE: The patient was scanned on a [employer]. PROTOCOL: A 120 kV prospective scan was triggered in the descending thoracic aorta at 111 HU's. Axial non-contrast 3 mm slices were carried out through the heart. The data set was analyzed on a dedicated work station and scored using the Agatston method. Gantry rotation speed was 250 msecs and collimation was .6 mm. Beta blockade and 0.8 mg of sl NTG was given. The 3D data set was reconstructed in 5% intervals of the 35-75 % of the R-R cycle. Systolic and diastolic phases were analyzed on a dedicated work station using MPR, MIP and VRT modes. The patient received 50mL OMNIPAQUE IOHEXOL 350 MG/ML SOLN contrast. FINDINGS: Image quality: Good Noise artifact is:  Limited Coronary calcium score is 35, which places the patient in the 58 percentile for age and sex matched control. Coronary arteries: Normal coronary origins.  Right dominance. Right Coronary Artery: No detectable plaque or stenosis. Left Main Coronary Artery: No detectable plaque or stenosis. Left Anterior Descending Coronary Artery: Minimal mixed atherosclerotic plaque in the ostial LAD, <25% stenosis. Left Circumflex Artery: No detectable plaque or stenosis. Aorta: Normal size, 28 mm at the mid ascending aorta (level of the PA bifurcation) measured double oblique. No calcifications. No dissection. Aortic Valve: No calcifications.  Tricuspid aortic valve. Other findings: Normal pulmonary vein drainage into the left atrium. Normal left atrial appendage without thrombus. Normal size of the pulmonary artery. Trivial pericardial effusion. IMPRESSION: 1.  Minimal CAD, CADRADS = 1. 2. Coronary calcium score is 35, which places the patient in the 62 percentile for age and sex matched control. 3. Normal coronary origins with right dominance. Electronically Signed   By: Cherlynn Kaiser M.D.   On: 04/07/2021 15:06   Result Date: 04/07/2021 EXAM: OVER-READ INTERPRETATION  CT CHEST The following report is an over-read performed by radiologist Dr. Rolm Baptise of [facility], [place] on 04/07/2021. This over-read does not include interpretation of cardiac or coronary anatomy or pathology. The coronary CTA interpretation by the cardiologist is attached. COMPARISON:  None. FINDINGS: Vascular: Heart is normal size.  Aorta normal caliber. Mediastinum/Nodes: No adenopathy. Lungs/Pleura: No confluent opacity or effusion. Scarring in the anterior left lower lobe. Upper Abdomen: Imaging into the upper abdomen demonstrates no acute findings. Musculoskeletal: Chest wall soft tissues are unremarkable. No acute bony abnormality. IMPRESSION: No acute or significant extracardiac abnormality. Electronically Signed: By: Rolm Baptise  M.D. On: 04/07/2021 13:24   ECHOCARDIOGRAM COMPLETE  Result Date: 03/31/2021    ECHOCARDIOGRAM REPORT   Patient Name:   Samantha Graham Date of Exam: 03/31/2021 Medical  Rec #:  [id number]          Height:       63.0 in Accession #:    [id number]         Weight:       112.4 lb Date of Birth:  [birth year]           BSA:          1.514 m Patient Age:    66 years           BP:           100/62 mmHg Patient Gender: F                  HR:           60 bpm. Exam Location:  Outpatient Procedure: 2D Echo, Color Doppler, Cardiac Doppler and Strain Analysis Indications:    Nonspecific abnormal electrocardiogram (ECG) (EKG)  History:        Patient has no prior history of Echocardiogram examinations.                 Risk Factors:Non-Smoker and Dyslipidemia. Covid 19.  Sonographer:    Leavy Cella RDCS Referring Phys: [place]  1. Left ventricular ejection fraction, by estimation, is 60 to 65%. The left ventricle has normal function. The left ventricle has no regional wall motion abnormalities. Left ventricular diastolic parameters were normal.  2. Right ventricular systolic function is normal. The right ventricular size is normal. There is normal pulmonary artery systolic pressure. The estimated right ventricular systolic pressure is 40.3 mmHg.  3. The mitral valve is grossly normal. Trivial mitral valve regurgitation. No evidence of mitral stenosis.  4. The aortic valve is tricuspid. Aortic valve regurgitation is not visualized. No aortic stenosis is present.  5. The inferior vena cava is normal in size with greater than 50% respiratory variability, suggesting right atrial pressure of 3 mmHg. Conclusion(s)/Recommendation(s): Normal biventricular function without evidence of hemodynamically significant valvular heart disease. FINDINGS  Left Ventricle: Left ventricular ejection fraction, by estimation, is 60 to 65%. The left ventricle has normal function. The left ventricle has no regional wall motion  abnormalities. The left ventricular internal cavity size was normal in size. There is  no left ventricular hypertrophy. Left ventricular diastolic parameters were normal. Right Ventricle: The right ventricular size is normal. No increase in right ventricular wall thickness. Right ventricular systolic function is normal. There is normal pulmonary artery systolic pressure. The tricuspid regurgitant velocity is 2.27 m/s, and  with an assumed right atrial pressure of 3 mmHg, the estimated right ventricular systolic pressure is 47.4 mmHg. Left Atrium: Left atrial size was normal in size. Right Atrium: Right atrial size was normal in size. Pericardium: There is no evidence of pericardial effusion. Mitral Valve: The mitral valve is grossly normal. Trivial mitral valve regurgitation. No evidence of mitral valve stenosis. Tricuspid Valve: The tricuspid valve is grossly normal. Tricuspid valve regurgitation is trivial. No evidence of tricuspid stenosis. Aortic Valve: The aortic valve is tricuspid. Aortic valve regurgitation is not visualized. No aortic stenosis is present. Pulmonic Valve: The pulmonic valve was grossly normal. Pulmonic valve regurgitation is trivial. No evidence of pulmonic stenosis. Aorta: The aortic root and ascending aorta are structurally normal, with no evidence of dilitation. Venous: The inferior vena cava is normal in size with greater than 50% respiratory variability, suggesting right atrial pressure of 3 mmHg. IAS/Shunts: The atrial septum is grossly normal.  LEFT VENTRICLE PLAX 2D LVIDd:  3.60 cm     Diastology LVIDs:         2.24 cm     LV e' medial:    10.10 cm/s LV PW:         0.90 cm     LV E/e' medial:  8.1 LV IVS:        0.81 cm     LV e' lateral:   10.90 cm/s LVOT diam:     1.90 cm     LV E/e' lateral: 7.6 LVOT Area:     2.84 cm                            2D Longitudinal Strain                            2D Strain GLS Avg:     -24.8 % LV Volumes (MOD) LV vol d, MOD A2C: 55.2 ml LV vol  d, MOD A4C: 57.8 ml LV vol s, MOD A2C: 16.7 ml LV vol s, MOD A4C: 18.9 ml LV SV MOD A2C:     38.5 ml LV SV MOD A4C:     57.8 ml LV SV MOD BP:      38.7 ml RIGHT VENTRICLE RV Basal diam:  3.25 cm RV Mid diam:    2.75 cm RV S prime:     11.70 cm/s TAPSE (M-mode): 2.3 cm LEFT ATRIUM             Index       RIGHT ATRIUM           Index LA diam:        2.90 cm 1.92 cm/m  RA Area:     10.70 cm 7.07 cm/m LA Vol (A2C):   34.7 ml 22.93 ml/m LA Vol (A4C):   21.0 ml 13.87 ml/m LA Biplane Vol: 27.3 ml 18.04 ml/m   AORTA Ao Root diam: 2.70 cm MITRAL VALVE               TRICUSPID VALVE MV Area (PHT): 3.99 cm    TR Peak grad:   20.6 mmHg MV Decel Time: 190 msec    TR Vmax:        227.00 cm/s MR Peak grad: 44.1 mmHg MR Vmax:      332.00 cm/s  SHUNTS MV E velocity: 82.30 cm/s  Systemic Diam: 1.90 cm MV A velocity: 66.60 cm/s MV E/A ratio:  1.24 Eleonore Chiquito MD Electronically signed by Eleonore Chiquito MD Signature Date/Time: 03/31/2021/8:09:43 PM    Final     ASSESSMENT & PLAN:   76 y.o. 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 No constitutional symptoms. Newly noted - incidentally on annual labs. No older labs available for reference.  FISH Prognostic panel revealed a monoallelic 66Y deletion which  might suggest a more indolent course for her CLL  11/19/17 CT C/A/P revealed no changes in spleen size and no overtly pathologic adenopathy is identified. One upper normal sized lymph node is a 9 mm in short axis left axillary lymph node.   . Lab Results  Component Value Date   LDH 137 04/13/2021   PLAN: -Discussed pt labwork today, 04/13/2021; WBC improved, no anemia, Plt normal. -No lab or clinic evidence of significant CLL progression at this time. Will continue watchful observation.  -Recommended  that the pt continue to eat well, drink at least 48-64 oz of water each day, and  walk 20-30 minutes each day.  -Goal Vitamin D between 60-90. 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 -Will see back in 6 months with labs.   .3) . 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   FOLLOW UP: RTC with Dr Irene Limbo with labs in 6 months  . The total time spent in the appointment was 20 minutes and more than 50% was on counseling and direct patient cares.  All of the patient's questions were answered with apparent satisfaction. The patient knows to call the clinic with any problems, questions or concerns.    Sullivan Lone MD [place] AAHIVMS [facility] Hematology/Oncology Physician [facility]

## 2021-05-09 ENCOUNTER — Other Ambulatory Visit: Payer: Self-pay

## 2021-05-09 ENCOUNTER — Other Ambulatory Visit (INDEPENDENT_AMBULATORY_CARE_PROVIDER_SITE_OTHER): Payer: Medicare HMO

## 2021-05-09 DIAGNOSIS — E890 Postprocedural hypothyroidism: Secondary | ICD-10-CM

## 2021-05-09 DIAGNOSIS — E7800 Pure hypercholesterolemia, unspecified: Secondary | ICD-10-CM

## 2021-05-09 DIAGNOSIS — E559 Vitamin D deficiency, unspecified: Secondary | ICD-10-CM

## 2021-05-09 LAB — VITAMIN D 25 HYDROXY (VIT D DEFICIENCY, FRACTURES): VITD: 72.69 ng/mL (ref 30.00–100.00)

## 2021-05-09 LAB — COMPREHENSIVE METABOLIC PANEL
ALT: 18 U/L (ref 0–35)
AST: 27 U/L (ref 0–37)
Albumin: 4.5 g/dL (ref 3.5–5.2)
Alkaline Phosphatase: 41 U/L (ref 39–117)
BUN: 18 mg/dL (ref 6–23)
CO2: 29 mEq/L (ref 19–32)
Calcium: 10.1 mg/dL (ref 8.4–10.5)
Chloride: 104 mEq/L (ref 96–112)
Creatinine, Ser: 1.16 mg/dL (ref 0.40–1.20)
GFR: 46.24 mL/min — ABNORMAL LOW (ref >60.00)
Glucose, Bld: 109 mg/dL — ABNORMAL HIGH (ref 70–99)
Potassium: 3.7 mEq/L (ref 3.5–5.1)
Sodium: 140 mEq/L (ref 135–145)
Total Bilirubin: 0.4 mg/dL (ref 0.2–1.2)
Total Protein: 7.1 g/dL (ref 6.0–8.3)

## 2021-05-09 LAB — LIPID PANEL
Cholesterol: 159 mg/dL (ref 0–200)
HDL: 64.8 mg/dL (ref >39.00)
LDL Cholesterol: 75 mg/dL (ref 0–99)
NonHDL: 94.21
Total CHOL/HDL Ratio: 2
Triglycerides: 98 mg/dL (ref 0.0–149.0)
VLDL: 19.6 mg/dL (ref 0.0–40.0)

## 2021-05-09 LAB — T3, FREE: T3, Free: 2.3 pg/mL (ref 2.3–4.2)

## 2021-05-09 LAB — T4, FREE: Free T4: 0.58 ng/dL — ABNORMAL LOW (ref 0.60–1.60)

## 2021-05-09 LAB — TSH: TSH: 2.14 u[IU]/mL (ref 0.35–5.50)

## 2021-05-10 ENCOUNTER — Ambulatory Visit: Payer: Medicare HMO | Admitting: Internal Medicine

## 2021-05-10 ENCOUNTER — Encounter: Payer: Self-pay | Admitting: Internal Medicine

## 2021-05-10 VITALS — BP 98/60 | HR 71 | Temp 98.4°F | Ht 63.5 in | Wt 114.4 lb

## 2021-05-10 DIAGNOSIS — R0602 Shortness of breath: Secondary | ICD-10-CM

## 2021-05-10 DIAGNOSIS — Z8616 Personal history of COVID-19: Secondary | ICD-10-CM

## 2021-05-10 NOTE — Patient Instructions (Addendum)
Please schedule follow up scheduled as needed.  Before your next visit I would like you to have: Full set of PFTs - pulmonary function testing - 1 hour

## 2021-05-10 NOTE — Progress Notes (Signed)
Samantha Graham    [id number]    07/09/1946  Primary Care Physician:Meyers, Annie Main, MD  Referring Physician: Sanda Klein, MD [address] [place],   [id number] Reason for Consultation: shortness of breath Date of Consultation: 05/10/2021  Chief complaint:   Chief Complaint  Patient presents with   Consult    SOB     HPI: Samantha Graham is a 75 y.o. woman with history of CLLwho presents with new patient evaluation for dyspnea. Symptoms started after she had covid infection in march 2022. Had diarrhea with paxlovid and didn't finish the course.  She also has gout, osteopenia and osteoarthritis.  She was put on steroids by a family friend for her sciatica. He did an xray which was clear, and she has also had an echocardiogram which led to evaluation with cardiologist. She had ct coronary calcium test which showed some scarring in the left anterior lower lobe. This is why she is presenting, out of concern for an abnormal ct chest.   She notes doing stretching, yoga, barre, pilates regularly and she is able to do all these activities without dyspnea. She does feel like she has a hard time getting a deep breath in ince covid infection.  Denies cough, chest tightness, wheezing. No recurrent pneumonia or  bronchitis. No fevers chills nigh t sweats or weight loss  No childhood respiratory disease  Social history:  Occupation: [employer], [employer], retired Exposures: lives at home with husband, no pets Smoking history: never smoker, no significant passive smoke exposure  Social History   Occupational History   Not on file  Tobacco Use   Smoking status: Never   Smokeless tobacco: Never  Vaping Use   Vaping Use: Never used  Substance and Sexual Activity   Alcohol use: No   Drug use: No   Sexual activity: Not on file    Relevant family history:  Family History  Problem Relation Age of Onset   Thyroid disease Mother    Cancer Father     Diabetes Cousin     Past Medical History:  Diagnosis Date   Hyperlipidemia     Past Surgical History:  Procedure Laterality Date   THYROIDECTOMY  1985     Physical Exam: Blood pressure 98/60, pulse 71, temperature 98.4 F (36.9 C), height 5' 3.5" (1.613 m), weight 114 lb 6.4 oz (51.9 kg), SpO2 99 %. Gen:      No acute distress ENT:  no nasal polyps, mucus membranes moist Lungs:    No increased respiratory effort, symmetric chest wall excursion, clear to auscultation bilaterally, no wheezes or crackles CV:         Regular rate and rhythm; no murmurs, rubs, or gallops.  No pedal edema Abd:      + bowel sounds; soft, non-tender; no distension MSK: no acute synovitis of DIP or PIP joints, no mechanics hands.  Skin:      Warm and dry; no rashes Neuro: normal speech, no focal facial asymmetry Psych: alert and oriented x3, normal mood and affect   Data Reviewed/Medical Decision Making:  Independent interpretation of tests: Imaging:  Review of patient's Ct cardiac calcium scoring images revealed small left anterior lower lobe scarring. The patient's images have been independently reviewed by me.    PFTs: None on file No flowsheet data found.  Labs:  Lab Results  Component Value Date   NA 140 05/09/2021   K 3.7 05/09/2021   CL 104  05/09/2021   CO2 29 05/09/2021   Lab Results  Component Value Date   WBC 57.4 (HH) 04/13/2021   HGB 12.6 04/13/2021   HCT 38.4 04/13/2021   MCV 92.8 04/13/2021   PLT 158 04/13/2021      Immunization status:  Immunization History  Administered Date(s) Administered   Fluad Quad(high Dose 65+) 04/07/2019   Influenza Split 04/30/2009, 07/12/2011, 05/30/2012, 05/13/2020   Influenza Whole 07/12/2011, 05/30/2012, 05/20/2013   Influenza, High Dose Seasonal PF 05/27/2015, 05/13/2020   Influenza,inj,Quad PF,6+ Mos 06/30/2014   Influenza-Unspecified 04/30/2009, 05/20/2013   PFIZER Comirnaty(Gray Top)Covid-19 Tri-Sucrose Vaccine 10/08/2020    PFIZER(Purple Top)SARS-COV-2 Vaccination 08/18/2019, 09/08/2019, 09/11/2019   Pneumococcal Conjugate-13 02/03/2014, 08/15/2017   Pneumococcal Polysaccharide-23 12/13/2017, 09/24/2020   Tdap 12/04/2014, 01/14/2015   Zoster Recombinat (Shingrix) 02/27/2019, 06/26/2019   Zoster, Live 02/27/2019, 06/26/2019     I reviewed prior external note(s) from cardiology  I reviewed the result(s) of the labs and imaging as noted above.   I have ordered PFT  Assessment:  Dyspnea following covid 19 infection Abnormal CT Chest, left lower lobe scarring  Plan/Recommendations: Her scarring is very mild and likely post infectious or post inflammatory. I do not think contributing to her symptoms. Exam and imaging thus far reassuring, I don't feel there is fibrosis related to covid. Will proceed with PFTs. If abnormal will pursue dedicated ct chest. However I suspect this will be normal.   Will contact her with results and next steps.   Return to Care: Return if symptoms worsen or fail to improve.  Lenice Llamas, MD Pulmonary and [place]  CC: Croitoru, Dani Gobble, MD

## 2021-05-12 ENCOUNTER — Other Ambulatory Visit: Payer: Self-pay

## 2021-05-12 ENCOUNTER — Ambulatory Visit (INDEPENDENT_AMBULATORY_CARE_PROVIDER_SITE_OTHER): Payer: Medicare HMO | Admitting: Endocrinology

## 2021-05-12 VITALS — Ht 63.25 in | Wt 114.0 lb

## 2021-05-12 DIAGNOSIS — E7800 Pure hypercholesterolemia, unspecified: Secondary | ICD-10-CM

## 2021-05-12 DIAGNOSIS — M810 Age-related osteoporosis without current pathological fracture: Secondary | ICD-10-CM | POA: Diagnosis not present

## 2021-05-12 DIAGNOSIS — L539 Erythematous condition, unspecified: Secondary | ICD-10-CM | POA: Diagnosis not present

## 2021-05-12 DIAGNOSIS — E079 Disorder of thyroid, unspecified: Secondary | ICD-10-CM | POA: Diagnosis not present

## 2021-05-12 DIAGNOSIS — R519 Headache, unspecified: Secondary | ICD-10-CM | POA: Diagnosis not present

## 2021-05-12 DIAGNOSIS — E890 Postprocedural hypothyroidism: Secondary | ICD-10-CM | POA: Diagnosis not present

## 2021-05-12 DIAGNOSIS — Z79899 Other long term (current) drug therapy: Secondary | ICD-10-CM | POA: Diagnosis not present

## 2021-05-12 DIAGNOSIS — E559 Vitamin D deficiency, unspecified: Secondary | ICD-10-CM

## 2021-05-12 DIAGNOSIS — R5383 Other fatigue: Secondary | ICD-10-CM | POA: Diagnosis not present

## 2021-05-12 DIAGNOSIS — R69 Illness, unspecified: Secondary | ICD-10-CM | POA: Diagnosis not present

## 2021-05-12 DIAGNOSIS — Z79890 Hormone replacement therapy (postmenopausal): Secondary | ICD-10-CM | POA: Diagnosis not present

## 2021-05-12 DIAGNOSIS — W109XXA Fall (on) (from) unspecified stairs and steps, initial encounter: Secondary | ICD-10-CM | POA: Diagnosis not present

## 2021-05-12 DIAGNOSIS — S0990XA Unspecified injury of head, initial encounter: Secondary | ICD-10-CM | POA: Diagnosis not present

## 2021-05-12 DIAGNOSIS — S0083XA Contusion of other part of head, initial encounter: Secondary | ICD-10-CM | POA: Diagnosis not present

## 2021-05-12 MED ORDER — LEVOTHYROXINE SODIUM 25 MCG PO TABS: 37.5000 ug | ORAL_TABLET | Freq: Every day | ORAL | 1 refills | Status: DC

## 2021-05-12 MED ORDER — LIOTHYRONINE SODIUM 5 MCG PO TABS: 5.0000 ug | ORAL_TABLET | Freq: Every day | ORAL | 2 refills | Status: DC

## 2021-05-12 NOTE — Progress Notes (Signed)
Patient ID: Samantha Graham, female   DOB: 08/23/1945, 75 y.o.   MRN: [id number]  I connected with the above-named patient by video enabled telemedicine application and verified that I am speaking with the correct person. The patient was explained the limitations of evaluation and management by telemedicine and the availability of in person appointments.  Patient also understood that there may be a patient responsible charge related to this service  Location of the patient: Patient's home  Location of the provider: Physician office Only the patient and myself were participating in the encounter The patient understood the above statements and agreed to proceed.    Reason for Appointment: Osteoporosis, followup visit   History of Present Illness:    HYPOTHYROID: This was first diagnosed in 1985  She has history of hyperthyroidism and thyroid nodule treated with thyroidectomy and ? Radioactive iodine but detailed records are not available of this According to her records since about 2001 had been taking 88 mcg brand name Synthroid  Since about 12/14 her TSH had been relatively low and her dose was reduced gradually to a final dose of 62.5 g She however was having complaints of extreme fatigue, brain fog and wanted to increase her dose to feel better  Because of her fatigue she was empirically started on Armour Thyroid in 04/2014 with 45 g. She did feel less fatigued with this along with less hair loss initially  She tends to have fatigue despite normal TSH; since 12/20 is taking Armour Thyroid daily   With the increase she has had less fatigue; recently has had some intermittent fatigue for other reasons  She has been taking her Armour Thyroid daily in the morning before breakfast consistently She takes her calcium/vitamin D separately  Her TSH is normal again  T3 level is slightly lower than before at 2.3  Free T4 is slightly low from being on Armour Thyroid  Labs  as follows   Lab Results  Component Value Date   TSH 2.14 05/09/2021   TSH 1.65 05/10/2020   TSH 3.01 11/10/2019   FREET4 0.58 (L) 05/09/2021   FREET4 0.58 (L) 05/10/2020   FREET4 0.71 11/10/2019   Free T3 is 2.3   OTHER active problems addressed today: See review of systems   Lab on 05/09/2021  Component Date Value Ref Range Status   VITD 05/09/2021 72.69  30.00 - 100.00 ng/mL Final   Cholesterol 05/09/2021 159  0 - 200 mg/dL Final   ATP III Classification       Desirable:  < 200 mg/dL               Borderline High:  200 - 239 mg/dL          High:  > = 240 mg/dL   Triglycerides 05/09/2021 98.0  0.0 - 149.0 mg/dL Final   Normal:  <150 mg/dLBorderline High:  150 - 199 mg/dL   HDL 05/09/2021 64.80  >39.00 mg/dL Final   VLDL 05/09/2021 19.6  0.0 - 40.0 mg/dL Final   LDL Cholesterol 05/09/2021 75  0 - 99 mg/dL Final   Total CHOL/HDL Ratio 05/09/2021 2   Final                  Men          Women1/2 Average Risk     3.4          3.3Average Risk          5.0  4.42X Average Risk          9.6          7.13X Average Risk          15.0          11.0                       NonHDL 05/09/2021 94.21   Final   NOTE:  Non-HDL goal should be 30 mg/dL higher than patient's LDL goal (i.e. LDL goal of < 70 mg/dL, would have non-HDL goal of < 100 mg/dL)   T3, Free 05/09/2021 2.3  2.3 - 4.2 pg/mL Final   Free T4 05/09/2021 0.58 (A)  0.60 - 1.60 ng/dL Final   Comment: Specimens from patients who are undergoing biotin therapy and /or ingesting biotin supplements may contain high levels of biotin.  The higher biotin concentration in these specimens interferes with this Free T4 assay.  Specimens that contain high levels  of biotin may cause false high results for this Free T4 assay.  Please interpret results in light of the total clinical presentation of the patient.     TSH 05/09/2021 2.14  0.35 - 5.50 uIU/mL Final   Sodium 05/09/2021 140  135 - 145 mEq/L Final   Potassium 05/09/2021 3.7  3.5 -  5.1 mEq/L Final   Chloride 05/09/2021 104  96 - 112 mEq/L Final   CO2 05/09/2021 29  19 - 32 mEq/L Final   Glucose, Bld 05/09/2021 109 (A)  70 - 99 mg/dL Final   BUN 05/09/2021 18  6 - 23 mg/dL Final   Creatinine, Ser 05/09/2021 1.16  0.40 - 1.20 mg/dL Final   Total Bilirubin 05/09/2021 0.4  0.2 - 1.2 mg/dL Final   Alkaline Phosphatase 05/09/2021 41  39 - 117 U/L Final   AST 05/09/2021 27  0 - 37 U/L Final   ALT 05/09/2021 18  0 - 35 U/L Final   Total Protein 05/09/2021 7.1  6.0 - 8.3 g/dL Final   Albumin 05/09/2021 4.5  3.5 - 5.2 g/dL Final   GFR 05/09/2021 46.24 (A)  >60.00 mL/min Final   Calculated using the CKD-EPI Creatinine Equation (2021)   Calcium 05/09/2021 10.1  8.4 - 10.5 mg/dL Final    Allergies as of 05/12/2021   No Known Allergies      Medication List        Accurate as of May 12, 2021  8:12 AM. If you have any questions, ask your nurse or doctor.          b complex-C-E-zinc tablet Take 1 tablet by mouth daily.   Fish Oil 1000 MG Caps Take by mouth.   Hi-Cal Liqd Take by mouth.   meloxicam 15 MG tablet Commonly known as: MOBIC Take 15 mg by mouth daily.   raloxifene 60 MG tablet Commonly known as: EVISTA Take 1 tablet (60 mg total) by mouth daily.   rosuvastatin 10 MG tablet Commonly known as: CRESTOR TAKE 1 TABLET BY MOUTH EVERY DAY   thyroid 90 MG tablet Commonly known as: Armour Thyroid TAKE 1/2 TABLET BY MOUTH DAILY, NOT ON SUNDAY   Vitamin D3 125 MCG (5000 UT) Caps Take 5,000 Units by mouth once.   Xiidra 5 % Soln Generic drug: Lifitegrast Apply 1 drop to eye 2 (two) times daily.        Allergies: No Known Allergies  Past Medical History:  Diagnosis Date   Hyperlipidemia  Past Surgical History:  Procedure Laterality Date   THYROIDECTOMY  1985    Family History  Problem Relation Age of Onset   Thyroid disease Mother    Cancer Father    Diabetes Cousin     Social History:  reports that she has never smoked.  She has never used smokeless tobacco. She reports that she does not drink alcohol and does not use drugs.  REVIEW Of SYSTEMS:  Weight history as follows   Wt Readings from Last 3 Encounters:  05/10/21 114 lb 6.4 oz (51.9 kg)  04/13/21 114 lb 12.8 oz (52.1 kg)  03/25/21 112 lb 6.4 oz (51 kg)    Osteoporosis:   Baseline bone density done by gynecologist showed T score was -3.0     Bone density on 08/08/16 shows T scores of -2.4 at spine (improved) and -2.2 at hip, stable  Report from 04/29/2019:  Bone density at spine is --1.9 Bone density at the hip is -2.4 on the left side and -2.0 on the right  She did not want to take Fosamax because it did not work with her mother apparently She was given ACTONEL in 2017 and again 2018 but stopped this because of her perception that she was having joint pains after taking this after a few prescriptions Also concerned about possible hair loss as a side effect  Reclast was given in 5/16, she did have some nausea the next day Does not want to take Reclast again because she thinks it made her hair thin out  She was started on Evista in 12/20 and is now taking it daily She does periodically have mild hot flashes  Vitamin D supplements: She was taking 5000 units twice weekly and was told to start taking this daily   Vitamin D level has been previously normal and now back up to 72.7 with taking 5000 units daily  Lab Results  Component Value Date   VD25OH 72.69 05/09/2021   VD25OH 28.56 (L) 05/10/2020   VD25OH 40.59 11/10/2019     CLL: She is being followed by oncologist for asymptomatic disease and no treatment recommended   Hypercholesterolemia:  Her baseline LDL was 210 and she is being treated with Crestor 10 mg  Has been following a low-cholesterol low-fat diet consistently  She is asking about Crestor affecting her immune system  Lipids are within target range  Lab Results  Component Value Date   CHOL 159 05/09/2021   CHOL  147 05/10/2020   CHOL 177 11/10/2019   Lab Results  Component Value Date   HDL 64.80 05/09/2021   HDL 55.60 05/10/2020   HDL 70.20 11/10/2019   Lab Results  Component Value Date   LDLCALC 75 05/09/2021   LDLCALC 69 05/10/2020   [place] 90 11/10/2019   Lab Results  Component Value Date   TRIG 98.0 05/09/2021   TRIG 115.0 05/10/2020   TRIG 85.0 11/10/2019   Lab Results  Component Value Date   CHOLHDL 2 05/09/2021   CHOLHDL 3 05/10/2020   CHOLHDL 3 11/10/2019   No results found for: LDLDIRECT      Examination:   There were no vitals taken for this visit.     Assessment /Plan   OSTEOPOROSIS:   Her baseline T score was - 3.0  She is taking Evista and tolerates it well with only mild hot flashes No other side effects and discussed need to follow-up regularly with checking her height She will continue this long-term We will need follow-up  bone density next year Also needs to check her height at the office  Vitamin D deficiency: Is repleted, she needs to continue vitamin D 5000 units but she will take it 5 days a week instead of daily    Lipids: Well-controlled on Crestor, reassured her about the safety and needs to continue this long-term  HYPOTHYROIDISM: She complains that she is feeling exhausted and has not been able to pinpoint the reason, she still has tendency to insomnia as before  She taking Armour Thyroid 45 mg every day, TSH quite normal, explained that this is the most sensitive test for her thyroid function However since her free T4 is tending to be slightly low would benefit from increasing her levothyroxine component and compensating by lowering the T3 component slightly She will now take 37.5 mcg levothyroxine with 5 mcg liothyronine   Follow-up in 2 months    There are no Patient Instructions on file for this visit.   Elayne Snare 05/12/2021, 8:12 AM

## 2021-06-13 ENCOUNTER — Ambulatory Visit (INDEPENDENT_AMBULATORY_CARE_PROVIDER_SITE_OTHER): Payer: Medicare HMO | Admitting: Internal Medicine

## 2021-06-13 ENCOUNTER — Other Ambulatory Visit: Payer: Self-pay

## 2021-06-13 DIAGNOSIS — R0602 Shortness of breath: Secondary | ICD-10-CM | POA: Diagnosis not present

## 2021-06-13 LAB — PULMONARY FUNCTION TEST
DL/VA % pred: 102 %
DL/VA: 4.27 ml/min/mmHg/L
DLCO cor % pred: 84 %
DLCO cor: 15.02 ml/min/mmHg
DLCO unc % pred: 84 %
DLCO unc: 15.02 ml/min/mmHg
FEF 25-75 Post: 1.7 L/sec
FEF 25-75 Pre: 1.26 L/sec
FEF2575-%Change-Post: 35 %
FEF2575-%Pred-Post: 109 %
FEF2575-%Pred-Pre: 81 %
FEV1-%Change-Post: 10 %
FEV1-%Pred-Post: 89 %
FEV1-%Pred-Pre: 80 %
FEV1-Post: 1.71 L
FEV1-Pre: 1.54 L
FEV1FVC-%Change-Post: 8 %
FEV1FVC-%Pred-Pre: 97 %
FEV6-%Change-Post: 2 %
FEV6-%Pred-Post: 88 %
FEV6-%Pred-Pre: 86 %
FEV6-Post: 2.17 L
FEV6-Pre: 2.11 L
FEV6FVC-%Pred-Post: 105 %
FEV6FVC-%Pred-Pre: 105 %
FVC-%Change-Post: 2 %
FVC-%Pred-Post: 84 %
FVC-%Pred-Pre: 82 %
FVC-Post: 2.17 L
FVC-Pre: 2.11 L
Post FEV1/FVC ratio: 79 %
Post FEV6/FVC ratio: 100 %
Pre FEV1/FVC ratio: 73 %
Pre FEV6/FVC Ratio: 100 %
RV % pred: 101 %
RV: 2.21 L
TLC % pred: 90 %
TLC: 4.31 L

## 2021-06-13 NOTE — Progress Notes (Signed)
PFT done today. 

## 2021-06-14 NOTE — Progress Notes (Signed)
Normal Pulmonary function.

## 2021-06-21 DIAGNOSIS — C9110 Chronic lymphocytic leukemia of B-cell type not having achieved remission: Secondary | ICD-10-CM | POA: Diagnosis not present

## 2021-07-05 ENCOUNTER — Other Ambulatory Visit: Payer: Self-pay | Admitting: Endocrinology

## 2021-07-07 DIAGNOSIS — H04123 Dry eye syndrome of bilateral lacrimal glands: Secondary | ICD-10-CM | POA: Diagnosis not present

## 2021-07-07 DIAGNOSIS — H5213 Myopia, bilateral: Secondary | ICD-10-CM | POA: Diagnosis not present

## 2021-07-07 DIAGNOSIS — H524 Presbyopia: Secondary | ICD-10-CM | POA: Diagnosis not present

## 2021-07-11 ENCOUNTER — Other Ambulatory Visit (INDEPENDENT_AMBULATORY_CARE_PROVIDER_SITE_OTHER): Payer: Medicare HMO

## 2021-07-11 ENCOUNTER — Other Ambulatory Visit: Payer: Self-pay | Admitting: Endocrinology

## 2021-07-11 ENCOUNTER — Other Ambulatory Visit: Payer: Self-pay

## 2021-07-11 DIAGNOSIS — E890 Postprocedural hypothyroidism: Secondary | ICD-10-CM | POA: Diagnosis not present

## 2021-07-11 LAB — T3, FREE: T3, Free: 2.5 pg/mL (ref 2.3–4.2)

## 2021-07-11 LAB — T4, FREE: Free T4: 0.68 ng/dL (ref 0.60–1.60)

## 2021-07-11 LAB — TSH: TSH: 2.02 u[IU]/mL (ref 0.35–5.50)

## 2021-07-14 ENCOUNTER — Other Ambulatory Visit: Payer: Medicare HMO

## 2021-07-14 ENCOUNTER — Other Ambulatory Visit: Payer: Self-pay

## 2021-07-14 ENCOUNTER — Ambulatory Visit: Payer: Medicare HMO | Admitting: Endocrinology

## 2021-07-14 VITALS — BP 116/60 | HR 61 | Ht 63.25 in | Wt 113.4 lb

## 2021-07-14 DIAGNOSIS — R5383 Other fatigue: Secondary | ICD-10-CM

## 2021-07-14 DIAGNOSIS — C9110 Chronic lymphocytic leukemia of B-cell type not having achieved remission: Secondary | ICD-10-CM | POA: Diagnosis not present

## 2021-07-14 DIAGNOSIS — E890 Postprocedural hypothyroidism: Secondary | ICD-10-CM

## 2021-07-14 MED ORDER — THYROID 90 MG PO TABS: ORAL_TABLET | ORAL | 1 refills | Status: DC

## 2021-07-14 NOTE — Addendum Note (Signed)
Addended by: Elayne Snare on: 07/14/2021 04:20 PM   Modules accepted: Orders

## 2021-07-14 NOTE — Progress Notes (Signed)
Patient ID: Samantha Graham, female   DOB: 1946/02/24, 75 y.o.   MRN: [id number]    Reason for Appointment:  followup visit   History of Present Illness:    HYPOTHYROIDISM: This was first diagnosed in 1985  She has history of hyperthyroidism and thyroid nodule treated with thyroidectomy and ? Radioactive iodine but detailed records are not available of this According to her records since about 2001 had been taking 88 mcg brand name Synthroid  Since about 12/14 her TSH had been relatively low and her dose was reduced gradually to a final dose of 62.5 g She however was having complaints of extreme fatigue, brain fog and wanted to increase her dose to feel better  Because of her fatigue she was empirically started on Armour Thyroid in 04/2014 with 45 g. She did feel less fatigued with this along with less hair loss initially  She tends to have fatigue despite normal TSH  On her last visit about 2 months ago she was complaining of feeling exhausted and lack of energy As before her TSH was normal Because of her low normal T3 and low free T4 she was switched to levothyroxine 37.5 mcg with 5 mcg of Cytomel instead of 45 Armour Thyroid  She does not feel any better in fact she thinks she may be feeling more tired Also tends to have some cold intolerance No increasing hair loss Weight is about the same She is taking her supplements regularly  She takes her calcium/vitamin D separately  Her TSH is normal again   Free T4 is slightly improved and also her T3   Labs as follows   Lab Results  Component Value Date   TSH 2.02 07/11/2021   TSH 2.14 05/09/2021   TSH 1.65 05/10/2020   FREET4 0.68 07/11/2021   FREET4 0.58 (L) 05/09/2021   FREET4 0.58 (L) 05/10/2020   Free T3 is 2.5   OTHER active problems addressed today: See review of systems   Lab on 07/11/2021  Component Date Value Ref Range Status   T3, Free 07/11/2021 2.5  2.3 - 4.2 pg/mL Final   Free T4  07/11/2021 0.68  0.60 - 1.60 ng/dL Final   Comment: Specimens from patients who are undergoing biotin therapy and /or ingesting biotin supplements may contain high levels of biotin.  The higher biotin concentration in these specimens interferes with this Free T4 assay.  Specimens that contain high levels  of biotin may cause false high results for this Free T4 assay.  Please interpret results in light of the total clinical presentation of the patient.     TSH 07/11/2021 2.02  0.35 - 5.50 uIU/mL Final    Allergies as of 07/14/2021   No Known Allergies      Medication List        Accurate as of July 14, 2021  3:53 PM. If you have any questions, ask your nurse or doctor.          STOP taking these medications    levothyroxine 25 MCG tablet Commonly known as: SYNTHROID Stopped by: Elayne Snare, MD   liothyronine 5 MCG tablet Commonly known as: Cytomel Stopped by: Elayne Snare, MD       TAKE these medications    b complex-C-E-zinc tablet Take 1 tablet by mouth daily.   Fish Oil 1000 MG Caps Take by mouth.   Hi-Cal Liqd Take by mouth.   meloxicam 15 MG tablet Commonly known as: MOBIC Take 15 mg by  mouth daily.   raloxifene 60 MG tablet Commonly known as: EVISTA Take 1 tablet (60 mg total) by mouth daily.   rosuvastatin 10 MG tablet Commonly known as: CRESTOR TAKE 1 TABLET BY MOUTH EVERY DAY   thyroid 90 MG tablet Commonly known as: Armour Thyroid TAKE 1/2 A TABLET BY MOUTH DAILY Started by: Elayne Snare, MD   Vitamin D3 125 MCG (5000 UT) Caps Take 5,000 Units by mouth once.   Xiidra 5 % Soln Generic drug: Lifitegrast Apply 1 drop to eye 2 (two) times daily.        Allergies: No Known Allergies  Past Medical History:  Diagnosis Date   Hyperlipidemia     Past Surgical History:  Procedure Laterality Date   THYROIDECTOMY  1985    Family History  Problem Relation Age of Onset   Thyroid disease Mother    Cancer Father    Diabetes Cousin      Social History:  reports that she has never smoked. She has never used smokeless tobacco. She reports that she does not drink alcohol and does not use drugs.  REVIEW Of SYSTEMS:  Weight history as follows She has normal appetite No lightheadedness No nausea  Wt Readings from Last 3 Encounters:  07/14/21 113 lb 6.4 oz (51.4 kg)  05/12/21 114 lb (51.7 kg)  05/10/21 114 lb 6.4 oz (51.9 kg)    Osteoporosis:   Baseline bone density done by gynecologist showed T score was -3.0     Bone density on 08/08/16 shows T scores of -2.4 at spine (improved) and -2.2 at hip, stable  Report from 04/29/2019:  Bone density at spine is --1.9 Bone density at the hip is -2.4 on the left side and -2.0 on the right  She did not want to take Fosamax because it did not work with her mother apparently She was given ACTONEL in 2017 and again 2018 but stopped this because of her perception that she was having joint pains after taking this after a few prescriptions Also concerned about possible hair loss as a side effect  Reclast was given in 5/16, she did have some nausea the next day Does not want to take Reclast again because she thinks it made her hair thin out  She was started on Evista in 12/20 She does periodically have mild hot flashes  Vitamin D supplements: She was taking 5000 units twice weekly and was told to start taking this daily   Vitamin D level has been previously normal and now back up to 72.7 with taking 5000 units daily  Lab Results  Component Value Date   VD25OH 72.69 05/09/2021   VD25OH 28.56 (L) 05/10/2020   VD25OH 40.59 11/10/2019     CLL: She is being followed by oncologist for stable disease and no treatment recommended No anemia, last WBC was 66,000  Hypercholesterolemia:  Her baseline LDL was 210 and she is being treated with Crestor 10 mg  Has been following a low-cholesterol low-fat diet consistently   Lipids are within target range  Lab Results   Component Value Date   CHOL 159 05/09/2021   CHOL 147 05/10/2020   CHOL 177 11/10/2019   Lab Results  Component Value Date   HDL 64.80 05/09/2021   HDL 55.60 05/10/2020   HDL 70.20 11/10/2019   Lab Results  Component Value Date   LDLCALC 75 05/09/2021   [place] 69 05/10/2020   [place] 90 11/10/2019   Lab Results  Component Value Date  TRIG 98.0 05/09/2021   TRIG 115.0 05/10/2020   TRIG 85.0 11/10/2019   Lab Results  Component Value Date   CHOLHDL 2 05/09/2021   CHOLHDL 3 05/10/2020   CHOLHDL 3 11/10/2019   No results found for: LDLDIRECT  BP Readings from Last 3 Encounters:  07/14/21 116/60  05/10/21 98/60  04/13/21 (!) 101/56       Examination:   BP 116/60 (Patient Position: Standing)    Pulse 61    Ht 5' 3.25" (1.607 m)    Wt 113 lb 6.4 oz (51.4 kg)    SpO2 96%    BMI 19.93 kg/m      Assessment /Plan    HYPOTHYROIDISM:  Currently taking levothyroxine and liothyronine and lieu of Armour Thyroid However she does not think she feels any better with the switch  As before her TSH is quite normal Free T3 and free T4 levels are relatively better compared to Armour Thyroid  She thinks that she presents with some fatigue and this is despite her sleep very little better Have any signs or symptoms of adrenal insufficiency  Discussed that no etiology for her fatigue has been found from the endocrine standpoint She does not think her fatigue is dating back to the episode of COVID infection in January She would not benefit from increasing her thyroid supplements   She will now take half of the 90 mg Armour Thyroid as requested  Recommended that she follow-up with her PCP to explore reasons why she is having significant fatigue She can have B12 level checked  Follow-up in 6 months    There are no Patient Instructions on file for this visit.   Elayne Snare 07/14/2021, 3:53 PM

## 2021-07-15 LAB — VITAMIN B12: Vitamin B-12: 1127 pg/mL (ref 232–1245)

## 2021-08-02 ENCOUNTER — Other Ambulatory Visit: Payer: Self-pay | Admitting: Endocrinology

## 2021-10-10 ENCOUNTER — Other Ambulatory Visit: Payer: Self-pay

## 2021-10-10 DIAGNOSIS — C9110 Chronic lymphocytic leukemia of B-cell type not having achieved remission: Secondary | ICD-10-CM

## 2021-10-11 ENCOUNTER — Inpatient Hospital Stay: Payer: PPO

## 2021-10-11 ENCOUNTER — Other Ambulatory Visit: Payer: Self-pay

## 2021-10-11 ENCOUNTER — Inpatient Hospital Stay: Payer: PPO | Attending: Hematology | Admitting: Hematology

## 2021-10-11 VITALS — BP 98/67 | HR 58 | Temp 97.7°F | Resp 20 | Wt 115.1 lb

## 2021-10-11 DIAGNOSIS — C9110 Chronic lymphocytic leukemia of B-cell type not having achieved remission: Secondary | ICD-10-CM

## 2021-10-11 LAB — CBC WITH DIFFERENTIAL (CANCER CENTER ONLY)
Abs Immature Granulocytes: 0.11 10*3/uL — ABNORMAL HIGH (ref 0.00–0.07)
Basophils Absolute: 0.1 10*3/uL (ref 0.0–0.1)
Basophils Relative: 0 %
Eosinophils Absolute: 0.2 10*3/uL (ref 0.0–0.5)
Eosinophils Relative: 0 %
HCT: 38.6 % (ref 36.0–46.0)
Hemoglobin: 12.7 g/dL (ref 12.0–15.0)
Immature Granulocytes: 0 %
Lymphocytes Relative: 93 %
Lymphs Abs: 72 10*3/uL — ABNORMAL HIGH (ref 0.7–4.0)
MCH: 30.6 pg (ref 26.0–34.0)
MCHC: 32.9 g/dL (ref 30.0–36.0)
MCV: 93 fL (ref 80.0–100.0)
Monocytes Absolute: 1.5 10*3/uL — ABNORMAL HIGH (ref 0.1–1.0)
Monocytes Relative: 2 %
Neutro Abs: 3.8 10*3/uL (ref 1.7–7.7)
Neutrophils Relative %: 5 %
Platelet Count: 167 10*3/uL (ref 150–400)
RBC: 4.15 MIL/uL (ref 3.87–5.11)
RDW: 14.3 % (ref 11.5–15.5)
Smear Review: NORMAL
WBC Count: 77.7 10*3/uL (ref 4.0–10.5)
nRBC: 0 % (ref 0.0–0.2)

## 2021-10-11 LAB — LACTATE DEHYDROGENASE: LDH: 118 U/L (ref 98–192)

## 2021-10-11 LAB — CMP (CANCER CENTER ONLY)
ALT: 15 U/L (ref 0–44)
AST: 25 U/L (ref 15–41)
Albumin: 4.2 g/dL (ref 3.5–5.0)
Alkaline Phosphatase: 36 U/L — ABNORMAL LOW (ref 38–126)
Anion gap: 4 — ABNORMAL LOW (ref 5–15)
BUN: 22 mg/dL (ref 8–23)
CO2: 29 mmol/L (ref 22–32)
Calcium: 9.6 mg/dL (ref 8.9–10.3)
Chloride: 106 mmol/L (ref 98–111)
Creatinine: 1.06 mg/dL — ABNORMAL HIGH (ref 0.44–1.00)
GFR, Estimated: 55 mL/min — ABNORMAL LOW (ref >60)
Glucose, Bld: 101 mg/dL — ABNORMAL HIGH (ref 70–99)
Potassium: 4.4 mmol/L (ref 3.5–5.1)
Sodium: 139 mmol/L (ref 135–145)
Total Bilirubin: 0.4 mg/dL (ref 0.3–1.2)
Total Protein: 6.6 g/dL (ref 6.5–8.1)

## 2021-10-11 NOTE — Progress Notes (Signed)
? ? ?HEMATOLOGY/ONCOLOGY CLINIC NOTE ? ?Date of Service: 10/11/2021 ? ? ?Patient Care Team: ?Orpah Melter, MD as PCP - General (Family Medicine) ? 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 ?HISTORY OF PRESENTING ILLNESS:  ? ?Please see previous note for details on initial presentation ? ?INTERVAL HISTORY: ? ?Samantha Graham is a wonderful 76 y.o. female who is following up today regarding her Chronic Lymphocytic Leukemia. The pt reports that she is doing well overall.She reports no new concerns or symptoms. ? ?She reports she still has consistent grade 1 fatigue. She attributes the fatigue to her advanced age. She notes her recent fatigue is not as debilitating as previous visit to clinic. She describes the fatigue as she has to stop and rest for a little prior to returning to her daily activities. We discussed eating 5-8 servings of fruits and vegetables. She has been getting acupuncture to help. ? ?She notes some inconsistent SOB that comes and goes. She practices yoga and Tai Chi to help. ? ?No fever, chills, night sweats. ?No new lumps, bumps, or lesions/rashes. ?No abdominal pain or change in bowel habits. ?No loss of appetite or unexpected weight loss. ? ?Lab results today 10/11/2021 reviewed with the patient in detail. ? ?MEDICAL HISTORY:  ?Past Medical History:  ?Diagnosis Date  ? Hyperlipidemia   ?Hypothyroidism  ?Rt frozen shoulder ?Osteopenia/osteoporosis ? ?SURGICAL HISTORY: ?Past Surgical History:  ?Procedure Laterality Date  ? THYROIDECTOMY  1985  ?hysterectomy with BSO due to prolpased uterus ?Bunionectomy ?Meniscal injury -rt knee ?Bladder cyst removal ? ?SOCIAL HISTORY: ?Social History  ? ?Socioeconomic History  ? Marital status: Married  ?  Spouse name: Not on file  ? Number of children: Not on file  ? Years of education: Not on file  ? Highest education level: Not on file  ?Occupational History  ? Not on file  ?Tobacco Use  ?  Smoking status: Never  ? Smokeless tobacco: Never  ?Vaping Use  ? Vaping Use: Never used  ?Substance and Sexual Activity  ? Alcohol use: No  ? Drug use: No  ? Sexual activity: Not on file  ?Other Topics Concern  ? Not on file  ?Social History Narrative  ? Not on file  ? ?Social Determinants of Health  ? ?Financial Resource Strain: Not on file  ?Food Insecurity: Not on file  ?Transportation Needs: Not on file  ?Physical Activity: Not on file  ?Stress: Not on file  ?Social Connections: Not on file  ?Intimate Partner Violence: Not on file  ? ? ?FAMILY HISTORY: ?Family History  ?Problem Relation Age of Onset  ? Thyroid disease Mother   ? Cancer Father   ? Diabetes Cousin   ? ? ?ALLERGIES:  has No Known Allergies. ? ?MEDICATIONS:  ?Current Outpatient Medications  ?Medication Sig Dispense Refill  ? B Complex-C-E-Zn (B COMPLEX-C-E-ZINC) tablet Take 1 tablet by mouth daily.    ? Cholecalciferol (VITAMIN D3) 5000 UNITS CAPS Take 5,000 Units by mouth once.    ? meloxicam (MOBIC) 15 MG tablet Take 15 mg by mouth daily.    ? Nutritional Supplements (HI-CAL) LIQD Take by mouth.    ? Omega-3 Fatty Acids (FISH OIL) 1000 MG CAPS Take by mouth.    ? raloxifene (EVISTA) 60 MG tablet Take 1 tablet by mouth once daily 90 tablet 0  ? rosuvastatin (CRESTOR) 10 MG tablet TAKE 1 TABLET BY MOUTH EVERY DAY 90 tablet 1  ? thyroid (ARMOUR THYROID) 90 MG tablet  TAKE 1/2 A TABLET BY MOUTH DAILY 45 tablet 1  ? ?No current facility-administered medications for this visit.  ? ? ?REVIEW OF SYSTEMS:   ?10 Point review of Systems was done is negative except as noted above. ? ?PHYSICAL EXAMINATION: ?ECOG PERFORMANCE STATUS: 0 - Asymptomatic ? ?Vitals:  ? 10/11/21 1450  ?BP: 98/67  ?Pulse: (!) 58  ?Resp: 20  ?Temp: 97.7 ?F (36.5 ?C)  ?SpO2: 99%  ? ? ?Filed Weights  ? 10/11/21 1450  ?Weight: 115 lb 1.6 oz (52.2 kg)  ? ? ?.Body mass index is 20.23 kg/m?[name]  ?NAD ?GENERAL:alert, in no acute distress and comfortable ?SKIN: no acute rashes, no significant  lesions ?EYES: conjunctiva are pink and non-injected, sclera anicteric ?NECK: supple, no JVD ?LYMPH:  no palpable lymphadenopathy in the cervical, axillary or inguinal regions ?LUNGS: clear to auscultation b/l with normal respiratory effort ?HEART: regular rate & rhythm ?ABDOMEN:  normoactive bowel sounds , non tender, not distended. ?Extremity: no pedal edema ?PSYCH: alert & oriented x 3 with fluent speech ?NEURO: no focal motor/sensory deficits ? ? ?LABORATORY DATA:  ?I have reviewed the data as listed ? ?. ? ?  Latest Ref Rng & Units 10/11/2021  ?  2:10 PM 04/13/2021  ?  1:36 PM 03/07/2021  ?  1:12 PM  ?CBC  ?WBC 4.0 - 10.5 K/uL 77.7   57.4   59.0    ?Hemoglobin 12.0 - 15.0 g/dL 12.7   12.6   13.1    ?Hematocrit 36.0 - 46.0 % 38.6   38.4   39.1    ?Platelets 150 - 400 K/uL 167   158   176    ? ?. ?CBC ?   ?Component Value Date/Time  ? WBC 77.7 (HH) 10/11/2021 1410  ? WBC 57.4 (HH) 04/13/2021 1336  ? RBC 4.15 10/11/2021 1410  ? HGB 12.7 10/11/2021 1410  ? HCT 38.6 10/11/2021 1410  ? PLT 167 10/11/2021 1410  ? MCV 93.0 10/11/2021 1410  ? MCH 30.6 10/11/2021 1410  ? MCHC 32.9 10/11/2021 1410  ? RDW 14.3 10/11/2021 1410  ? LYMPHSABS 72.0 (H) 10/11/2021 1410  ? MONOABS 1.5 (H) 10/11/2021 1410  ? EOSABS 0.2 10/11/2021 1410  ? BASOSABS 0.1 10/11/2021 1410  ? ? ? ?. ? ?  Latest Ref Rng & Units 10/11/2021  ?  2:10 PM 05/09/2021  ? 11:53 AM 04/13/2021  ?  1:36 PM  ?CMP  ?Glucose 70 - 99 mg/dL 101   109   98    ?BUN 8 - 23 mg/dL _0 ?Creatinine 0.44 - 1.00 mg/dL 1.06   1.16   1.30    ?Sodium 135 - 145 mmol/L 139   140   142    ?Potassium 3.5 - 5.1 mmol/L 4.4   3.7   4.7    ?Chloride 98 - 111 mmol/L 106   104   106    ?CO2 22 - 32 mmol/L _1 ?Calcium 8.9 - 10.3 mg/dL 9.6   10.1   9.4    ?Total Protein 6.5 - 8.1 g/dL 6.6   7.1   6.5    ?Total Bilirubin 0.3 - 1.2 mg/dL 0.4   0.4   0.4    ?Alkaline Phos 38 - 126 U/L 36   41   38    ?AST 15 - 41 U/L _2 ?ALT  0 - 44 U/L _0 ? ?[name] ?Lab  Results  ?Component Value Date  ? LDH 118 10/11/2021  ? ? ? ? ? ? ? ?FISH: ?  ? ? ?RADIOGRAPHIC STUDIES: ? ?I have personally reviewed the radiological images as listed and agreed with the findings in the report. ?No results found. ? ?ASSESSMENT & PLAN:  ? ?76 y.o. female with  ? ?Patient Active Problem List  ? Diagnosis Date Noted  ? Hypercholesterolemia 05/02/2018  ? Age related osteoporosis 08/04/2016  ? 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 ? 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 ?No constitutional symptoms. ?Newly noted - incidentally on annual labs. No older labs available for reference. ? ?FISH Prognostic panel revealed a monoallelic 41J deletion which  ?might suggest a more indolent course for her CLL ? ?11/19/17 CT C/A/P revealed no changes in spleen size and no overtly pathologic adenopathy is identified. One upper normal sized lymph node is a 9 mm in short axis left axillary lymph node.   ?. ?Lab Results  ?Component Value Date  ? LDH 118 10/11/2021  ? ?PLAN: ?-Discussed pt labwork today, 10/11/2021 ?CBC shows WBC count of 77.7k hemoglobin of 12.7 with a platelet count of 167k ?CMP unremarkable ?LDH within normal limits. ?-No lab or clinic evidence of significant CLL progression at this time. Will continue watchful observation.  ?-Recommended that the pt continue to eat well, drink at least 48-64 oz of water each day, and walk 20-30 minutes each day.  ?-Goal Vitamin D between 60-90. ?-Recommended pt continue to f/u w Dermatologist due to 13q deletion of her CLL increasing risk of non-melanoma skin cancers. ?-Will see back in 6 months with labs. ?-Recommended eating 5-8 servings of fruits and vegetables. ? ? ?FOLLOW UP: ?RTC with Dr Irene Limbo with labs in 6 months ? ?The total time spent in the appointment was 20 minutes*. ? ?All of the patient's questions were answered with apparent  satisfaction. The patient knows to call the clinic with any problems, questions or concerns. ? ? ?Sullivan Lone MD MS AAHIVMS [facility] ?Hematology/Oncology Physician ?[place] ? ?.*Total Encounter Time as

## 2021-10-12 ENCOUNTER — Telehealth: Payer: Self-pay | Admitting: Hematology

## 2021-10-12 NOTE — Telephone Encounter (Signed)
Left message with follow-up appointment per 3/21 los. ?

## 2021-11-07 ENCOUNTER — Encounter: Payer: Self-pay | Admitting: Endocrinology

## 2021-11-07 ENCOUNTER — Other Ambulatory Visit: Payer: Self-pay | Admitting: Endocrinology

## 2021-11-08 ENCOUNTER — Other Ambulatory Visit: Payer: Self-pay | Admitting: Endocrinology

## 2021-11-10 DIAGNOSIS — M810 Age-related osteoporosis without current pathological fracture: Secondary | ICD-10-CM | POA: Diagnosis not present

## 2021-11-10 DIAGNOSIS — Z131 Encounter for screening for diabetes mellitus: Secondary | ICD-10-CM | POA: Diagnosis not present

## 2021-11-10 DIAGNOSIS — C9110 Chronic lymphocytic leukemia of B-cell type not having achieved remission: Secondary | ICD-10-CM | POA: Diagnosis not present

## 2021-11-10 DIAGNOSIS — R0602 Shortness of breath: Secondary | ICD-10-CM | POA: Diagnosis not present

## 2021-11-10 DIAGNOSIS — E039 Hypothyroidism, unspecified: Secondary | ICD-10-CM | POA: Diagnosis not present

## 2021-11-10 DIAGNOSIS — Z0000 Encounter for general adult medical examination without abnormal findings: Secondary | ICD-10-CM | POA: Diagnosis not present

## 2021-11-10 DIAGNOSIS — E785 Hyperlipidemia, unspecified: Secondary | ICD-10-CM | POA: Diagnosis not present

## 2021-11-10 DIAGNOSIS — Z13220 Encounter for screening for lipoid disorders: Secondary | ICD-10-CM | POA: Diagnosis not present

## 2021-11-10 DIAGNOSIS — M1990 Unspecified osteoarthritis, unspecified site: Secondary | ICD-10-CM | POA: Diagnosis not present

## 2021-11-14 DIAGNOSIS — Z136 Encounter for screening for cardiovascular disorders: Secondary | ICD-10-CM | POA: Diagnosis not present

## 2021-11-14 DIAGNOSIS — Z0000 Encounter for general adult medical examination without abnormal findings: Secondary | ICD-10-CM | POA: Diagnosis not present

## 2021-11-14 DIAGNOSIS — Z13220 Encounter for screening for lipoid disorders: Secondary | ICD-10-CM | POA: Diagnosis not present

## 2021-11-15 ENCOUNTER — Ambulatory Visit: Payer: PPO | Admitting: Internal Medicine

## 2021-11-22 ENCOUNTER — Other Ambulatory Visit: Payer: Self-pay | Admitting: Endocrinology

## 2021-11-22 DIAGNOSIS — M810 Age-related osteoporosis without current pathological fracture: Secondary | ICD-10-CM

## 2021-11-29 ENCOUNTER — Ambulatory Visit (INDEPENDENT_AMBULATORY_CARE_PROVIDER_SITE_OTHER)
Admission: RE | Admit: 2021-11-29 | Discharge: 2021-11-29 | Disposition: A | Discharge status: Home / Self Care | Payer: PPO | Source: Ambulatory Visit | Attending: Endocrinology | Admitting: Endocrinology

## 2021-11-29 DIAGNOSIS — M810 Age-related osteoporosis without current pathological fracture: Secondary | ICD-10-CM | POA: Diagnosis not present

## 2021-12-20 DIAGNOSIS — C9110 Chronic lymphocytic leukemia of B-cell type not having achieved remission: Secondary | ICD-10-CM | POA: Diagnosis not present

## 2022-01-04 ENCOUNTER — Encounter: Payer: Self-pay | Admitting: Endocrinology

## 2022-01-06 ENCOUNTER — Other Ambulatory Visit: Payer: Self-pay | Admitting: Endocrinology

## 2022-01-06 DIAGNOSIS — M810 Age-related osteoporosis without current pathological fracture: Secondary | ICD-10-CM

## 2022-01-06 DIAGNOSIS — E559 Vitamin D deficiency, unspecified: Secondary | ICD-10-CM

## 2022-01-06 DIAGNOSIS — E890 Postprocedural hypothyroidism: Secondary | ICD-10-CM

## 2022-01-09 ENCOUNTER — Telehealth: Payer: Self-pay

## 2022-01-09 NOTE — Telephone Encounter (Signed)
Patient left vm wanting to reschedule lab appt. Called back but no answer. Left vm to call me back.

## 2022-01-10 ENCOUNTER — Other Ambulatory Visit (INDEPENDENT_AMBULATORY_CARE_PROVIDER_SITE_OTHER): Payer: PPO

## 2022-01-10 DIAGNOSIS — E890 Postprocedural hypothyroidism: Secondary | ICD-10-CM | POA: Diagnosis not present

## 2022-01-10 DIAGNOSIS — E559 Vitamin D deficiency, unspecified: Secondary | ICD-10-CM | POA: Diagnosis not present

## 2022-01-10 LAB — TSH: TSH: 1.93 u[IU]/mL (ref 0.35–5.50)

## 2022-01-10 LAB — VITAMIN D 25 HYDROXY (VIT D DEFICIENCY, FRACTURES): VITD: 76.46 ng/mL (ref 30.00–100.00)

## 2022-01-10 LAB — T4, FREE: Free T4: 0.79 ng/dL (ref 0.60–1.60)

## 2022-01-11 ENCOUNTER — Other Ambulatory Visit: Payer: Self-pay | Admitting: Endocrinology

## 2022-01-11 DIAGNOSIS — C9110 Chronic lymphocytic leukemia of B-cell type not having achieved remission: Secondary | ICD-10-CM

## 2022-01-12 ENCOUNTER — Ambulatory Visit: Payer: PPO | Admitting: Endocrinology

## 2022-01-12 ENCOUNTER — Other Ambulatory Visit: Payer: Self-pay

## 2022-01-12 ENCOUNTER — Encounter: Payer: Self-pay | Admitting: Endocrinology

## 2022-01-12 VITALS — BP 92/58 | HR 63 | Ht 63.0 in | Wt 115.0 lb

## 2022-01-12 DIAGNOSIS — C9110 Chronic lymphocytic leukemia of B-cell type not having achieved remission: Secondary | ICD-10-CM | POA: Diagnosis not present

## 2022-01-12 DIAGNOSIS — E7800 Pure hypercholesterolemia, unspecified: Secondary | ICD-10-CM | POA: Diagnosis not present

## 2022-01-12 DIAGNOSIS — M810 Age-related osteoporosis without current pathological fracture: Secondary | ICD-10-CM

## 2022-01-12 DIAGNOSIS — E890 Postprocedural hypothyroidism: Secondary | ICD-10-CM

## 2022-01-12 NOTE — Progress Notes (Unsigned)
Patient ID: Samantha Graham, female   DOB: 1945-12-01, 76 y.o.   MRN: [id number]    Reason for Appointment:  followup visit   History of Present Illness:    HYPOTHYROIDISM: This was first diagnosed in 1985  She has history of hyperthyroidism and thyroid nodule treated with thyroidectomy and ? Radioactive iodine but detailed records are not available of this According to her records since about 2001 had been taking 88 mcg brand name Synthroid  Since about 12/14 her TSH had been relatively low and her dose was reduced gradually to a final dose of 62.5 g She however was having complaints of extreme fatigue, brain fog and wanted to increase her dose to feel better  Because of her fatigue she was empirically started on Armour Thyroid in 04/2014 with 45 g. She did feel less fatigued with this along with less hair loss initially  She tends to have fatigue despite normal TSH  On her last visit about 2 months ago she was complaining of feeling exhausted and lack of energy As before her TSH was normal Because of her low normal T3 and low free T4 she was switched to levothyroxine 37.5 mcg with 5 mcg of Cytomel instead of 45 Armour Thyroid  She does not feel any better in fact she thinks she may be feeling more tired Also tends to have some cold intolerance No increasing hair loss Weight is about the same She is taking her supplements regularly  She takes her calcium/vitamin D separately  Her TSH is normal again   Free T4 is slightly improved and also her T3   Labs as follows   Lab Results  Component Value Date   TSH 1.93 01/10/2022   TSH 2.02 07/11/2021   TSH 2.14 05/09/2021   FREET4 0.79 01/10/2022   FREET4 0.68 07/11/2021   FREET4 0.58 (L) 05/09/2021   Free T3 is 2.5   OTHER active problems addressed today: See review of systems   Lab on 01/10/2022  Component Date Value Ref Range Status   VITD 01/10/2022 76.46  30.00 - 100.00 ng/mL Final   Free T4  01/10/2022 0.79  0.60 - 1.60 ng/dL Final   Comment: Specimens from patients who are undergoing biotin therapy and /or ingesting biotin supplements may contain high levels of biotin.  The higher biotin concentration in these specimens interferes with this Free T4 assay.  Specimens that contain high levels  of biotin may cause false high results for this Free T4 assay.  Please interpret results in light of the total clinical presentation of the patient.     TSH 01/10/2022 1.93  0.35 - 5.50 uIU/mL Final    Allergies as of 01/12/2022   No Known Allergies      Medication List        Accurate as of January 12, 2022  1:23 PM. If you have any questions, ask your nurse or doctor.          b complex-C-E-zinc tablet Take 1 tablet by mouth daily.   Fish Oil 1000 MG Caps Take by mouth.   Hi-Cal Liqd Take by mouth.   loteprednol 0.5 % ophthalmic suspension Commonly known as: LOTEMAX 1 drop every morning.   meloxicam 15 MG tablet Commonly known as: MOBIC Take 15 mg by mouth daily.   raloxifene 60 MG tablet Commonly known as: EVISTA Take 1 tablet by mouth once daily   rosuvastatin 10 MG tablet Commonly known as: CRESTOR TAKE 1 TABLET BY MOUTH  EVERY DAY   Simbrinza 1-0.2 % Susp Generic drug: Brinzolamide-Brimonidine Apply 1 drop to eye 2 (two) times daily.   thyroid 90 MG tablet Commonly known as: NP Thyroid Take 1/2 (one-half) tablet by mouth once daily   Vitamin D3 125 MCG (5000 UT) Caps Take 5,000 Units by mouth once.   Xiidra 5 % Soln Generic drug: Lifitegrast Apply 1 drop to eye 2 (two) times daily.        Allergies: No Known Allergies  Past Medical History:  Diagnosis Date   Hyperlipidemia     Past Surgical History:  Procedure Laterality Date   THYROIDECTOMY  1985    Family History  Problem Relation Age of Onset   Thyroid disease Mother    Cancer Father    Diabetes Cousin     Social History:  reports that she has never smoked. She has never used  smokeless tobacco. She reports that she does not drink alcohol and does not use drugs.  REVIEW Of SYSTEMS:  Weight history as follows She has normal appetite   Wt Readings from Last 3 Encounters:  01/12/22 115 lb (52.2 kg)  10/11/21 115 lb 1.6 oz (52.2 kg)  07/14/21 113 lb 6.4 oz (51.4 kg)    Osteoporosis:   Baseline bone density done by gynecologist showed T score was -3.0     Bone density on 08/08/16 shows T scores of -2.4 at spine (improved) and -2.2 at hip, stable  Report from 04/29/2019:  Bone density at spine is --1.9 Bone density at the hip is -2.4 on the left side and -2.0 on the right  11/29/2021 Lumbar spine L1-L3 (L4) Femoral neck (FN) 33% distal radius  T-score -2.1 RFN: -1.9 LFN: -2.3 n/a  Change in BMD from previous DXA test (%) +3.0%   +2.0% n/a    She did not want to take Fosamax because it did not work with her mother apparently She was given ACTONEL in 2017 and again 2018 but stopped this because of her perception that she was having joint pains after taking this after a few prescriptions Also concerned about possible hair loss as a side effect  Reclast was given in 5/16, she did have some nausea the next day Does not want to take Reclast again because she thinks it made her hair thin out  She was started on Evista in 12/20 She does periodically have mild hot flashes  Vitamin D supplements: She was taking 5000 units twice weekly and was told to start taking this daily   Vitamin D level has been previously normal and now back up to 76 with taking 5000 units daily  Lab Results  Component Value Date   VD25OH 76.46 01/10/2022   VD25OH 72.69 05/09/2021   VD25OH 28.56 (L) 05/10/2020     CLL: She is being followed by oncologist for stable disease and no treatment recommended No anemia, last WBC was 66,000  Hypercholesterolemia:  Her baseline LDL was 210 and she is being treated with Crestor 10 mg  Has been following a low-cholesterol low-fat diet  consistently   Lipids are within target range  Lab Results  Component Value Date   CHOL 159 05/09/2021   CHOL 147 05/10/2020   CHOL 177 11/10/2019   Lab Results  Component Value Date   HDL 64.80 05/09/2021   HDL 55.60 05/10/2020   HDL 70.20 11/10/2019   Lab Results  Component Value Date   LDLCALC 75 05/09/2021   [place] 69 05/10/2020   [place] 90  11/10/2019   Lab Results  Component Value Date   TRIG 98.0 05/09/2021   TRIG 115.0 05/10/2020   TRIG 85.0 11/10/2019   Lab Results  Component Value Date   CHOLHDL 2 05/09/2021   CHOLHDL 3 05/10/2020   CHOLHDL 3 11/10/2019   No results found for: "LDLDIRECT"  BP Readings from Last 3 Encounters:  01/12/22 (!) 92/58  10/11/21 98/67  07/14/21 116/60       Examination:   BP (!) 92/58   Pulse 63   Ht '5\' 3"'$  (1.6 m)   Wt 115 lb (52.2 kg)   SpO2 98%   BMI 20.37 kg/m      Assessment /Plan    HYPOTHYROIDISM:  Currently taking levothyroxine and liothyronine and lieu of Armour Thyroid However she does not think she feels any better with the switch  As before her TSH is quite normal Free T3 and free T4 levels are relatively better compared to Armour Thyroid  She thinks that she presents with some fatigue and this is despite her sleep very little better Have any signs or symptoms of adrenal insufficiency  Discussed that no etiology for her fatigue has been found from the endocrine standpoint She does not think her fatigue is dating back to the episode of COVID infection in January She would not benefit from increasing her thyroid supplements  She will now take half of the 90 mg Armour Thyroid as requested  Recommended that she follow-up with her PCP to explore reasons why she is having significant fatigue She can have B12 level checked  Follow-up in 6 months    There are no Patient Instructions on file for this visit.   Elayne Snare 01/12/2022, 1:23 PM

## 2022-01-13 ENCOUNTER — Encounter: Payer: Self-pay | Admitting: Endocrinology

## 2022-01-13 LAB — IRON AND TIBC
Iron Saturation: 22 % (ref 15–55)
Iron: 67 ug/dL (ref 27–139)
Total Iron Binding Capacity: 308 ug/dL (ref 250–450)
UIBC: 241 ug/dL (ref 118–369)

## 2022-01-16 DIAGNOSIS — L578 Other skin changes due to chronic exposure to nonionizing radiation: Secondary | ICD-10-CM | POA: Diagnosis not present

## 2022-01-16 DIAGNOSIS — Z85828 Personal history of other malignant neoplasm of skin: Secondary | ICD-10-CM | POA: Diagnosis not present

## 2022-01-16 DIAGNOSIS — L814 Other melanin hyperpigmentation: Secondary | ICD-10-CM | POA: Diagnosis not present

## 2022-01-16 DIAGNOSIS — L570 Actinic keratosis: Secondary | ICD-10-CM | POA: Diagnosis not present

## 2022-01-16 DIAGNOSIS — D225 Melanocytic nevi of trunk: Secondary | ICD-10-CM | POA: Diagnosis not present

## 2022-01-16 DIAGNOSIS — X32XXXS Exposure to sunlight, sequela: Secondary | ICD-10-CM | POA: Diagnosis not present

## 2022-01-28 ENCOUNTER — Other Ambulatory Visit: Payer: Self-pay | Admitting: Endocrinology

## 2022-02-02 ENCOUNTER — Telehealth: Payer: Self-pay | Admitting: Hematology

## 2022-02-02 NOTE — Telephone Encounter (Signed)
Left message with rescheduled upcoming appointment due to provider's template. 

## 2022-02-10 ENCOUNTER — Other Ambulatory Visit: Payer: Self-pay | Admitting: Endocrinology

## 2022-02-16 ENCOUNTER — Other Ambulatory Visit: Payer: Self-pay | Admitting: Family Medicine

## 2022-02-16 DIAGNOSIS — Z1231 Encounter for screening mammogram for malignant neoplasm of breast: Secondary | ICD-10-CM

## 2022-03-07 ENCOUNTER — Ambulatory Visit
Admission: RE | Admit: 2022-03-07 | Discharge: 2022-03-07 | Disposition: A | Discharge status: Home / Self Care | Payer: PPO | Source: Ambulatory Visit | Attending: Family Medicine | Admitting: Family Medicine

## 2022-03-07 DIAGNOSIS — Z1231 Encounter for screening mammogram for malignant neoplasm of breast: Secondary | ICD-10-CM | POA: Diagnosis not present

## 2022-04-13 ENCOUNTER — Other Ambulatory Visit: Payer: PPO

## 2022-04-13 ENCOUNTER — Ambulatory Visit: Payer: PPO | Admitting: Hematology

## 2022-04-13 ENCOUNTER — Other Ambulatory Visit: Payer: Self-pay

## 2022-04-13 DIAGNOSIS — C9110 Chronic lymphocytic leukemia of B-cell type not having achieved remission: Secondary | ICD-10-CM

## 2022-04-14 ENCOUNTER — Inpatient Hospital Stay (HOSPITAL_BASED_OUTPATIENT_CLINIC_OR_DEPARTMENT_OTHER): Payer: PPO | Admitting: Hematology

## 2022-04-14 ENCOUNTER — Inpatient Hospital Stay: Payer: PPO | Attending: Hematology

## 2022-04-14 VITALS — BP 94/50 | HR 63 | Temp 97.9°F | Resp 15 | Wt 112.5 lb

## 2022-04-14 DIAGNOSIS — R5383 Other fatigue: Secondary | ICD-10-CM | POA: Insufficient documentation

## 2022-04-14 DIAGNOSIS — C9110 Chronic lymphocytic leukemia of B-cell type not having achieved remission: Secondary | ICD-10-CM | POA: Insufficient documentation

## 2022-04-14 LAB — CMP (CANCER CENTER ONLY)
ALT: 21 U/L (ref 0–44)
AST: 37 U/L (ref 15–41)
Albumin: 4.1 g/dL (ref 3.5–5.0)
Alkaline Phosphatase: 38 U/L (ref 38–126)
Anion gap: 3 — ABNORMAL LOW (ref 5–15)
BUN: 33 mg/dL — ABNORMAL HIGH (ref 8–23)
CO2: 31 mmol/L (ref 22–32)
Calcium: 9.3 mg/dL (ref 8.9–10.3)
Chloride: 105 mmol/L (ref 98–111)
Creatinine: 1.26 mg/dL — ABNORMAL HIGH (ref 0.44–1.00)
GFR, Estimated: 44 mL/min — ABNORMAL LOW (ref >60)
Glucose, Bld: 116 mg/dL — ABNORMAL HIGH (ref 70–99)
Potassium: 4.5 mmol/L (ref 3.5–5.1)
Sodium: 139 mmol/L (ref 135–145)
Total Bilirubin: 0.4 mg/dL (ref 0.3–1.2)
Total Protein: 6.3 g/dL — ABNORMAL LOW (ref 6.5–8.1)

## 2022-04-14 LAB — CBC WITH DIFFERENTIAL (CANCER CENTER ONLY)
Abs Immature Granulocytes: 0.14 10*3/uL — ABNORMAL HIGH (ref 0.00–0.07)
Basophils Absolute: 0.1 10*3/uL (ref 0.0–0.1)
Basophils Relative: 0 %
Eosinophils Absolute: 0.2 10*3/uL (ref 0.0–0.5)
Eosinophils Relative: 0 %
HCT: 38.9 % (ref 36.0–46.0)
Hemoglobin: 12.5 g/dL (ref 12.0–15.0)
Immature Granulocytes: 0 %
Lymphocytes Relative: 95 %
Lymphs Abs: 91 10*3/uL — ABNORMAL HIGH (ref 0.7–4.0)
MCH: 30 pg (ref 26.0–34.0)
MCHC: 32.1 g/dL (ref 30.0–36.0)
MCV: 93.5 fL (ref 80.0–100.0)
Monocytes Absolute: 0.5 10*3/uL (ref 0.1–1.0)
Monocytes Relative: 1 %
Neutro Abs: 3.8 10*3/uL (ref 1.7–7.7)
Neutrophils Relative %: 4 %
Platelet Count: 170 10*3/uL (ref 150–400)
RBC: 4.16 MIL/uL (ref 3.87–5.11)
RDW: 14.1 % (ref 11.5–15.5)
Smear Review: NORMAL
WBC Count: 95.7 10*3/uL (ref 4.0–10.5)
nRBC: 0 % (ref 0.0–0.2)

## 2022-04-14 LAB — LACTATE DEHYDROGENASE: LDH: 123 U/L (ref 98–192)

## 2022-04-20 NOTE — Progress Notes (Signed)
HEMATOLOGY/ONCOLOGY CLINIC NOTE  Date of Service: 04/14/2022   Patient Care Team: Orpah Melter, MD as PCP - General (Family Medicine)  CHIEF COMPLAINTS/PURPOSE OF CONSULTATION:  Follow-up for continued evaluation and management of CLL  HISTORY OF PRESENTING ILLNESS:  Please see previous note for details on initial presentation  INTERVAL HISTORY: Samantha Graham is a wonderful 76 y.o. female who is following up today regarding her Chronic Lymphocytic Leukemia. She reports She is doing well with no new symptoms or concerns.  She reports unchanged grade 1 fatigue.  No fever, chills, night sweats. No new lumps, bumps, or lesions/rashes. No abdominal pain or change in bowel habits. No loss of appetite or unexpected weight loss.  Lab results today 04/14/2022 reviewed with the patient in detail.  MEDICAL HISTORY:  Past Medical History:  Diagnosis Date   Hyperlipidemia   Hypothyroidism  Rt frozen shoulder Osteopenia/osteoporosis  SURGICAL HISTORY: Past Surgical History:  Procedure Laterality Date   THYROIDECTOMY  1985  hysterectomy with BSO due to prolpased uterus Bunionectomy Meniscal injury -rt knee Bladder cyst removal  SOCIAL HISTORY: Social History   Socioeconomic History   Marital status: Married    Spouse name: Not on file   Number of children: Not on file   Years of education: Not on file   Highest education level: Not on file  Occupational History   Not on file  Tobacco Use   Smoking status: Never   Smokeless tobacco: Never  Vaping Use   Vaping Use: Never used  Substance and Sexual Activity   Alcohol use: No   Drug use: No   Sexual activity: Not on file  Other Topics Concern   Not on file  Social History Narrative   Not on file   Social Determinants of Health   Financial Resource Strain: Not on file  Food Insecurity: Not on file  Transportation Needs: Not on file  Physical Activity: Not on file  Stress: Not on file  Social  Connections: Not on file  Intimate Partner Violence: Not on file    FAMILY HISTORY: Family History  Problem Relation Age of Onset   Thyroid disease Mother    Cancer Father    Diabetes Cousin     ALLERGIES:  has No Known Allergies.  MEDICATIONS:  Current Outpatient Medications  Medication Sig Dispense Refill   B Complex-C-E-Zn (B COMPLEX-C-E-ZINC) tablet Take 1 tablet by mouth daily.     Cholecalciferol (VITAMIN D3) 5000 UNITS CAPS Take 5,000 Units by mouth once.     cycloSPORINE (RESTASIS) 0.05 % ophthalmic emulsion 1 drop 2 (two) times daily.     loteprednol (LOTEMAX) 0.5 % ophthalmic suspension 1 drop every morning.     meloxicam (MOBIC) 15 MG tablet Take 15 mg by mouth daily.     Nutritional Supplements (HI-CAL) LIQD Take by mouth.     Omega-3 Fatty Acids (FISH OIL) 1000 MG CAPS Take by mouth.     raloxifene (EVISTA) 60 MG tablet Take 1 tablet by mouth once daily 90 tablet 0   rosuvastatin (CRESTOR) 10 MG tablet TAKE 1 TABLET BY MOUTH EVERY DAY 90 tablet 1   SIMBRINZA 1-0.2 % SUSP Apply 1 drop to eye 2 (two) times daily.     thyroid (NP THYROID) 90 MG tablet Take 1/2 (one-half) tablet by mouth once daily 45 tablet 1   XIIDRA 5 % SOLN Apply 1 drop to eye 2 (two) times daily.     No current facility-administered medications for this visit.  REVIEW OF SYSTEMS:   10 Point review of Systems was done is negative except as noted above.  PHYSICAL EXAMINATION: ECOG PERFORMANCE STATUS: 0 - Asymptomatic  Vitals:   04/14/22 1308  BP: (!) 94/50  Pulse: 63  Resp: 15  Temp: 97.9 F (36.6 C)  SpO2: 97%    Filed Weights   04/14/22 1308  Weight: 112 lb 8 oz (51 kg)    .Body mass index is 19.93 kg/m.  NAD GENERAL:alert, in no acute distress and comfortable SKIN: no acute rashes, no significant lesions EYES: conjunctiva are pink and non-injected, sclera anicteric NECK: supple, no JVD LYMPH:  no palpable lymphadenopathy in the cervical, axillary or inguinal  regions LUNGS: clear to auscultation b/l with normal respiratory effort HEART: regular rate & rhythm ABDOMEN:  normoactive bowel sounds , non tender, not distended. Extremity: no pedal edema PSYCH: alert & oriented x 3 with fluent speech NEURO: no focal motor/sensory deficits   LABORATORY DATA:  I have reviewed the data as listed  .    Latest Ref Rng & Units 04/14/2022   12:40 PM 10/11/2021    2:10 PM 04/13/2021    1:36 PM  CBC  WBC 4.0 - 10.5 K/uL 95.7  77.7  57.4   Hemoglobin 12.0 - 15.0 g/dL 12.5  12.7  12.6   Hematocrit 36.0 - 46.0 % 38.9  38.6  38.4   Platelets 150 - 400 K/uL 170  167  158    . CBC    Component Value Date/Time   WBC 95.7 (HH) 04/14/2022 1240   WBC 57.4 (HH) 04/13/2021 1336   RBC 4.16 04/14/2022 1240   HGB 12.5 04/14/2022 1240   HCT 38.9 04/14/2022 1240   PLT 170 04/14/2022 1240   MCV 93.5 04/14/2022 1240   MCH 30.0 04/14/2022 1240   MCHC 32.1 04/14/2022 1240   RDW 14.1 04/14/2022 1240   LYMPHSABS 91.0 (H) 04/14/2022 1240   MONOABS 0.5 04/14/2022 1240   EOSABS 0.2 04/14/2022 1240   BASOSABS 0.1 04/14/2022 1240     .    Latest Ref Rng & Units 04/14/2022   12:40 PM 10/11/2021    2:10 PM 05/09/2021   11:53 AM  CMP  Glucose 70 - 99 mg/dL 116  101  109   BUN 8 - 23 mg/dL 33  22  18   Creatinine 0.44 - 1.00 mg/dL 1.26  1.06  1.16   Sodium 135 - 145 mmol/L 139  139  140   Potassium 3.5 - 5.1 mmol/L 4.5  4.4  3.7   Chloride 98 - 111 mmol/L 105  106  104   CO2 22 - 32 mmol/L '31  29  29   '$ Calcium 8.9 - 10.3 mg/dL 9.3  9.6  10.1   Total Protein 6.5 - 8.1 g/dL 6.3  6.6  7.1   Total Bilirubin 0.3 - 1.2 mg/dL 0.4  0.4  0.4   Alkaline Phos 38 - 126 U/L 38  36  41   AST 15 - 41 U/L 37  25  27   ALT 0 - 44 U/L '21  15  18    '$ . Lab Results  Component Value Date   LDH 123 04/14/2022         FISH:     RADIOGRAPHIC STUDIES: I have personally reviewed the radiological images as listed and agreed with the findings in the report. No results  found.  ASSESSMENT & PLAN:   76 y.o. 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 No constitutional symptoms. Newly noted - incidentally on annual labs. No older labs available for reference.  FISH Prognostic panel revealed a monoallelic 85R deletion which  might suggest a more indolent course for her CLL  11/19/17 CT C/A/P revealed no changes in spleen size and no overtly pathologic adenopathy is identified. One upper normal sized lymph node is a 9 mm in short axis left axillary lymph node.   . Lab Results  Component Value Date   LDH 123 04/14/2022   PLAN: -Discussed pt labwork today, 10/11/2021 CBC shows WBC count of 95.7k hemoglobin of 12.5 with a platelet count of 170k CMP stable and unremarkable. LDH within normal limits. -No lab or clinic evidence of significant CLL progression at this time. Will continue watchful observation.  -Recommended that the pt continue to eat well, drink at least 48-64 oz of water each day, and walk 20-30 minutes each day.  -Goal Vitamin D between 60-90. 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 -Will see back in 6 months with labs. -Recommended eating 5-8 servings of fruits and vegetables. -discussed takign flu, new covid 19 and RSV vaccinations  FOLLOW UP: RTC with Dr Irene Limbo with labs in 6 months.  The total time spent in the appointment was 20 minutes*.  All of the patient's questions were answered with apparent satisfaction. The patient knows to call the clinic with any problems, questions or concerns.   Sullivan Lone MD MS AAHIVMS [facility] Hematology/Oncology  Physician [facility]  .*Total Encounter Time as defined by the Centers for Medicare and Medicaid Services includes, in addition to the face-to-face time of a patient visit (documented in the note above) non-face-to-face time: obtaining and reviewing outside history, ordering and reviewing medications, tests or procedures, care coordination (communications with other health care professionals or caregivers) and documentation in the medical record.  I, Melene Muller, am acting as scribe for Dr. Sullivan Lone, MD.  .I have reviewed the above documentation for accuracy and completeness, and I agree with the above.Brunetta Genera MD

## 2022-05-24 ENCOUNTER — Other Ambulatory Visit: Payer: Self-pay | Admitting: Endocrinology

## 2022-06-26 DIAGNOSIS — C9110 Chronic lymphocytic leukemia of B-cell type not having achieved remission: Secondary | ICD-10-CM | POA: Diagnosis not present

## 2022-06-27 DIAGNOSIS — H04203 Unspecified epiphora, bilateral lacrimal glands: Secondary | ICD-10-CM | POA: Diagnosis not present

## 2022-07-11 ENCOUNTER — Other Ambulatory Visit (INDEPENDENT_AMBULATORY_CARE_PROVIDER_SITE_OTHER): Payer: PPO

## 2022-07-11 DIAGNOSIS — E890 Postprocedural hypothyroidism: Secondary | ICD-10-CM

## 2022-07-11 DIAGNOSIS — E7800 Pure hypercholesterolemia, unspecified: Secondary | ICD-10-CM | POA: Diagnosis not present

## 2022-07-11 DIAGNOSIS — M810 Age-related osteoporosis without current pathological fracture: Secondary | ICD-10-CM | POA: Diagnosis not present

## 2022-07-12 LAB — BASIC METABOLIC PANEL
BUN: 29 mg/dL — ABNORMAL HIGH (ref 6–23)
CO2: 29 mEq/L (ref 19–32)
Calcium: 9.4 mg/dL (ref 8.4–10.5)
Chloride: 105 mEq/L (ref 96–112)
Creatinine, Ser: 0.99 mg/dL (ref 0.40–1.20)
GFR: 55.47 mL/min — ABNORMAL LOW (ref >60.00)
Glucose, Bld: 95 mg/dL (ref 70–99)
Potassium: 4.5 mEq/L (ref 3.5–5.1)
Sodium: 141 mEq/L (ref 135–145)

## 2022-07-12 LAB — LIPID PANEL
Cholesterol: 156 mg/dL (ref 0–200)
HDL: 57.5 mg/dL (ref >39.00)
LDL Cholesterol: 67 mg/dL (ref 0–99)
NonHDL: 98.49
Total CHOL/HDL Ratio: 3
Triglycerides: 158 mg/dL — ABNORMAL HIGH (ref 0.0–149.0)
VLDL: 31.6 mg/dL (ref 0.0–40.0)

## 2022-07-12 LAB — T4, FREE: Free T4: 1.88 ng/dL — ABNORMAL HIGH (ref 0.60–1.60)

## 2022-07-12 LAB — TSH: TSH: 1.35 u[IU]/mL (ref 0.35–5.50)

## 2022-07-12 LAB — VITAMIN D 25 HYDROXY (VIT D DEFICIENCY, FRACTURES): VITD: 46.68 ng/mL (ref 30.00–100.00)

## 2022-07-13 ENCOUNTER — Telehealth (INDEPENDENT_AMBULATORY_CARE_PROVIDER_SITE_OTHER): Payer: PPO | Admitting: Endocrinology

## 2022-07-13 ENCOUNTER — Encounter: Payer: Self-pay | Admitting: Endocrinology

## 2022-07-13 ENCOUNTER — Ambulatory Visit: Payer: PPO | Admitting: Endocrinology

## 2022-07-13 VITALS — Ht 62.0 in | Wt 114.0 lb

## 2022-07-13 DIAGNOSIS — E890 Postprocedural hypothyroidism: Secondary | ICD-10-CM

## 2022-07-13 DIAGNOSIS — E559 Vitamin D deficiency, unspecified: Secondary | ICD-10-CM | POA: Diagnosis not present

## 2022-07-13 DIAGNOSIS — M810 Age-related osteoporosis without current pathological fracture: Secondary | ICD-10-CM

## 2022-07-13 DIAGNOSIS — E7800 Pure hypercholesterolemia, unspecified: Secondary | ICD-10-CM | POA: Diagnosis not present

## 2022-07-13 NOTE — Progress Notes (Signed)
Patient ID: Samantha Graham, female   DOB: 11/04/1945, 76 y.o.   MRN: [id number]  I connected with the above-named patient by video enabled telemedicine application and verified that I am speaking with the correct person. The patient was explained the limitations of evaluation and management by telemedicine and the availability of in person appointments.  Patient also understood that there may be a patient responsible charge related to this service  Location of the patient: Patient's home  Location of the provider: Physician office Only the patient and myself were participating in the encounter The patient understood the above statements and agreed to proceed.   Reason for Appointment:  followup visit   History of Present Illness:    HYPOTHYROIDISM: This was first diagnosed in 1985  She has history of hyperthyroidism and thyroid nodule treated with thyroidectomy and ? Radioactive iodine but detailed records are not available of this According to her records since about 2001 had been taking 88 mcg brand name Synthroid  Since about 12/14 her TSH had been relatively low and her dose was reduced gradually to a final dose of 62.5 g She however was having complaints of extreme fatigue, brain fog and wanted to increase her dose to feel better  Because of her fatigue she was empirically started on Armour Thyroid in 04/2014 with 45 g. She did feel less fatigued with this along with less hair loss initially  She tends to have fatigue despite normal TSH Because of her complaining of significant fatigue she was given a trial of levothyroxine 37.5 mcg with 5 mcg of Cytomel instead of 45 Armour Thyroid but she preferred to go back to thyroid for better energy level  She does get some fatigue at times but this is not as consistent and may be related to her sleep disturbance She tends to have some cold intolerance No hair loss  She is taking her thyroid supplement consistently every  morning before breakfast  She takes her calcium/vitamin D separately She has been taking B vitamins for quite some time but now is taking a vitamin called Lions mane  Her TSH is normal again   Free T4 is markedly increased compared to before   Labs as follows   Lab Results  Component Value Date   TSH 1.35 07/11/2022   TSH 1.93 01/10/2022   TSH 2.02 07/11/2021   FREET4 1.88 (H) 07/11/2022   FREET4 0.79 01/10/2022   FREET4 0.68 07/11/2021     OTHER active problems addressed today: See review of systems   Lab on 07/11/2022  Component Date Value Ref Range Status   VITD 07/11/2022 46.68  30.00 - 100.00 ng/mL Final   Cholesterol 07/11/2022 156  0 - 200 mg/dL Final   ATP III Classification       Desirable:  < 200 mg/dL               Borderline High:  200 - 239 mg/dL          High:  > = 240 mg/dL   Triglycerides 07/11/2022 158.0 (H)  0.0 - 149.0 mg/dL Final   Normal:  <150 mg/dLBorderline High:  150 - 199 mg/dL   HDL 07/11/2022 57.50  >39.00 mg/dL Final   VLDL 07/11/2022 31.6  0.0 - 40.0 mg/dL Final   LDL Cholesterol 07/11/2022 67  0 - 99 mg/dL Final   Total CHOL/HDL Ratio 07/11/2022 3   Final  Men          Women1/2 Average Risk     3.4          3.3Average Risk          5.0          4.42X Average Risk          9.6          7.13X Average Risk          15.0          11.0                       NonHDL 07/11/2022 98.49   Final   NOTE:  Non-HDL goal should be 30 mg/dL higher than patient's LDL goal (i.e. LDL goal of < 70 mg/dL, would have non-HDL goal of < 100 mg/dL)   Free T4 07/11/2022 1.88 (H)  0.60 - 1.60 ng/dL Final   Comment: Specimens from patients who are undergoing biotin therapy and /or ingesting biotin supplements may contain high levels of biotin.  The higher biotin concentration in these specimens interferes with this Free T4 assay.  Specimens that contain high levels  of biotin may cause false high results for this Free T4 assay.  Please interpret results in  light of the total clinical presentation of the patient.     TSH 07/11/2022 1.35  0.35 - 5.50 uIU/mL Final   Sodium 07/11/2022 141  135 - 145 mEq/L Final   Potassium 07/11/2022 4.5  3.5 - 5.1 mEq/L Final   Chloride 07/11/2022 105  96 - 112 mEq/L Final   CO2 07/11/2022 29  19 - 32 mEq/L Final   Glucose, Bld 07/11/2022 95  70 - 99 mg/dL Final   BUN 07/11/2022 29 (H)  6 - 23 mg/dL Final   Creatinine, Ser 07/11/2022 0.99  0.40 - 1.20 mg/dL Final   GFR 07/11/2022 55.47 (L)  >60.00 mL/min Final   Calculated using the CKD-EPI Creatinine Equation (2021)   Calcium 07/11/2022 9.4  8.4 - 10.5 mg/dL Final    Allergies as of 07/13/2022   No Known Allergies      Medication List        Accurate as of July 13, 2022 11:45 AM. If you have any questions, ask your nurse or doctor.          b complex-C-E-zinc tablet Take 1 tablet by mouth daily.   cycloSPORINE 0.05 % ophthalmic emulsion Commonly known as: RESTASIS 1 drop 2 (two) times daily.   Fish Oil 1000 MG Caps Take by mouth.   Hi-Cal Liqd Take by mouth.   loteprednol 0.5 % ophthalmic suspension Commonly known as: LOTEMAX 1 drop every morning.   meloxicam 15 MG tablet Commonly known as: MOBIC Take 15 mg by mouth daily.   raloxifene 60 MG tablet Commonly known as: EVISTA Take 1 tablet by mouth once daily   rosuvastatin 10 MG tablet Commonly known as: CRESTOR TAKE 1 TABLET BY MOUTH EVERY DAY   Simbrinza 1-0.2 % Susp Generic drug: Brinzolamide-Brimonidine Apply 1 drop to eye 2 (two) times daily.   thyroid 90 MG tablet Commonly known as: NP Thyroid Take 1/2 (one-half) tablet by mouth once daily   Vitamin D3 125 MCG (5000 UT) Caps Take 5,000 Units by mouth once.   Xiidra 5 % Soln Generic drug: Lifitegrast Apply 1 drop to eye 2 (two) times daily.        Allergies: No Known Allergies  Past Medical History:  Diagnosis Date   Hyperlipidemia     Past Surgical History:  Procedure Laterality Date    THYROIDECTOMY  47    Family History  Problem Relation Age of Onset   Thyroid disease Mother    Cancer Father    Diabetes Cousin     Social History:  reports that she has never smoked. She has never used smokeless tobacco. She reports that she does not drink alcohol and does not use drugs.  REVIEW Of SYSTEMS:  Recent weight is stable  Wt Readings from Last 3 Encounters:  07/13/22 114 lb (51.7 kg)  04/14/22 112 lb 8 oz (51 kg)  01/12/22 115 lb (52.2 kg)    Osteoporosis:  Baseline bone density done by gynecologist showed T score was -3.0     Bone density on 08/08/16 shows T scores of -2.4 at spine (improved) and -2.2 at hip, stable  Report from 04/29/2019:  Bone density at spine is --1.9 Bone density at the hip -2.4 on the left side and -2.0 on the right  Most recent bone density result:  11/29/2021 Lumbar spine L1-L3 (L4) Femoral neck (FN) 33% distal radius  T-score -2.1 RFN: -1.9 LFN: -2.3 n/a  Change in BMD from previous DXA test (%) +3.0%   +2.0% n/a    She did not want to take Fosamax because it did not work with her mother apparently She was given ACTONEL in 2017 and again 2018 but stopped this because of her perception that she was having joint pains after taking this after a few prescriptions Also concerned about possible hair loss as a side effect  Reclast was given in 5/16, she did have some nausea the next day Does not want to take Reclast again because she thinks it made her hair thin out  She was started on Evista in 12/20 and she continues to take this regularly No complaints of hot flashes with this Her height may have gone down 1 inch but not clear when she had a measurement of 5 feet 3 inches  Vitamin D supplements: She is taking 5000 units daily  Vitamin D level has been normal   Lab Results  Component Value Date   VD25OH 46.68 07/11/2022   VD25OH 76.46 01/10/2022   VD25OH 72.69 05/09/2021     CLL: She is being followed by oncologist for  stable disease and no treatment recommended No anemia, her oncologist is requesting an iron level  Hypercholesterolemia:  Her baseline LDL was 210 and she is being treated with Crestor 10 mg  Has been following a low-cholesterol low-fat diet consistently   Lipids are within target range  Lab Results  Component Value Date   CHOL 156 07/11/2022   CHOL 159 05/09/2021   CHOL 147 05/10/2020   Lab Results  Component Value Date   HDL 57.50 07/11/2022   HDL 64.80 05/09/2021   HDL 55.60 05/10/2020   Lab Results  Component Value Date   LDLCALC 67 07/11/2022   [place] 75 05/09/2021   [place] 69 05/10/2020   Lab Results  Component Value Date   TRIG 158.0 (H) 07/11/2022   TRIG 98.0 05/09/2021   TRIG 115.0 05/10/2020   Lab Results  Component Value Date   CHOLHDL 3 07/11/2022   CHOLHDL 2 05/09/2021   CHOLHDL 3 05/10/2020   No results found for: "LDLDIRECT"  She tends to have chronically slightly low blood pressure without symptoms  BP Readings from Last 3 Encounters:  04/14/22 (!) 94/50  01/12/22 (!) 92/58  10/11/21 98/67       Examination:   Ht '5\' 2"'$  (1.575 m)   Wt 114 lb (51.7 kg)   BMI 20.85 kg/m      Assessment /Plan    HYPOTHYROIDISM:  Continues to be taking Armour Thyroid Objectively she feels fairly good  As before her TSH is consistently normal She will continue Armour Thyroid, half of the 90 mg Armour Thyroid daily Reminded her not to take any vitamins couple of weeks before her next lab because of her higher T4 level  History of osteoporosis: Her bone density was last improved and is now osteopenic with lowest T-score -2.3 Her vitamin D level is therapeutic with supplements  She is going to continue EVISTA long-term, has no contraindications to taking this  History of hypercholesterolemia: Continue Crestor and lipids are well-controlled  Follow-up in 6 months    There are no Patient Instructions on file for this visit.   Elayne Snare 07/13/2022, 11:45 AM

## 2022-07-24 ENCOUNTER — Other Ambulatory Visit: Payer: Self-pay | Admitting: Endocrinology

## 2022-08-01 ENCOUNTER — Other Ambulatory Visit: Payer: Self-pay | Admitting: Endocrinology

## 2022-08-02 DIAGNOSIS — C44311 Basal cell carcinoma of skin of nose: Secondary | ICD-10-CM | POA: Diagnosis not present

## 2022-08-02 DIAGNOSIS — D485 Neoplasm of uncertain behavior of skin: Secondary | ICD-10-CM | POA: Diagnosis not present

## 2022-08-02 DIAGNOSIS — L720 Epidermal cyst: Secondary | ICD-10-CM | POA: Diagnosis not present

## 2022-08-21 ENCOUNTER — Other Ambulatory Visit: Payer: Self-pay | Admitting: Endocrinology

## 2022-08-28 DIAGNOSIS — M25561 Pain in right knee: Secondary | ICD-10-CM | POA: Diagnosis not present

## 2022-09-04 DIAGNOSIS — H04543 Stenosis of bilateral lacrimal canaliculi: Secondary | ICD-10-CM | POA: Diagnosis not present

## 2022-09-07 ENCOUNTER — Encounter: Payer: Self-pay | Admitting: Hematology

## 2022-09-13 DIAGNOSIS — H04561 Stenosis of right lacrimal punctum: Secondary | ICD-10-CM | POA: Diagnosis not present

## 2022-09-13 DIAGNOSIS — H04563 Stenosis of bilateral lacrimal punctum: Secondary | ICD-10-CM | POA: Diagnosis not present

## 2022-09-13 DIAGNOSIS — H04551 Acquired stenosis of right nasolacrimal duct: Secondary | ICD-10-CM | POA: Diagnosis not present

## 2022-09-13 DIAGNOSIS — H04562 Stenosis of left lacrimal punctum: Secondary | ICD-10-CM | POA: Diagnosis not present

## 2022-09-21 DIAGNOSIS — C44311 Basal cell carcinoma of skin of nose: Secondary | ICD-10-CM | POA: Diagnosis not present

## 2022-09-25 ENCOUNTER — Telehealth: Payer: Self-pay | Admitting: Hematology

## 2022-09-25 ENCOUNTER — Inpatient Hospital Stay (HOSPITAL_BASED_OUTPATIENT_CLINIC_OR_DEPARTMENT_OTHER): Payer: PPO | Admitting: Hematology

## 2022-09-25 ENCOUNTER — Other Ambulatory Visit: Payer: Self-pay

## 2022-09-25 ENCOUNTER — Inpatient Hospital Stay: Payer: PPO | Attending: Hematology

## 2022-09-25 VITALS — BP 103/62 | HR 76 | Temp 97.7°F | Resp 18 | Wt 111.2 lb

## 2022-09-25 DIAGNOSIS — C9110 Chronic lymphocytic leukemia of B-cell type not having achieved remission: Secondary | ICD-10-CM | POA: Diagnosis not present

## 2022-09-25 LAB — CBC WITH DIFFERENTIAL (CANCER CENTER ONLY)
Abs Immature Granulocytes: 0.19 10*3/uL — ABNORMAL HIGH (ref 0.00–0.07)
Basophils Absolute: 0.1 10*3/uL (ref 0.0–0.1)
Basophils Relative: 0 %
Eosinophils Absolute: 0.1 10*3/uL (ref 0.0–0.5)
Eosinophils Relative: 0 %
HCT: 39.2 % (ref 36.0–46.0)
Hemoglobin: 12.3 g/dL (ref 12.0–15.0)
Immature Granulocytes: 0 %
Lymphocytes Relative: 96 %
Lymphs Abs: 148.9 10*3/uL — ABNORMAL HIGH (ref 0.7–4.0)
MCH: 29.9 pg (ref 26.0–34.0)
MCHC: 31.4 g/dL (ref 30.0–36.0)
MCV: 95.4 fL (ref 80.0–100.0)
Monocytes Absolute: 1.8 10*3/uL — ABNORMAL HIGH (ref 0.1–1.0)
Monocytes Relative: 1 %
Neutro Abs: 4.2 10*3/uL (ref 1.7–7.7)
Neutrophils Relative %: 3 %
Platelet Count: 179 10*3/uL (ref 150–400)
RBC: 4.11 MIL/uL (ref 3.87–5.11)
RDW: 15 % (ref 11.5–15.5)
Smear Review: NORMAL
WBC Count: 155.4 10*3/uL (ref 4.0–10.5)
nRBC: 0 % (ref 0.0–0.2)

## 2022-09-25 LAB — CMP (CANCER CENTER ONLY)
ALT: 16 U/L (ref 0–44)
AST: 24 U/L (ref 15–41)
Albumin: 4.2 g/dL (ref 3.5–5.0)
Alkaline Phosphatase: 39 U/L (ref 38–126)
Anion gap: 5 (ref 5–15)
BUN: 26 mg/dL — ABNORMAL HIGH (ref 8–23)
CO2: 30 mmol/L (ref 22–32)
Calcium: 9.6 mg/dL (ref 8.9–10.3)
Chloride: 104 mmol/L (ref 98–111)
Creatinine: 1.05 mg/dL — ABNORMAL HIGH (ref 0.44–1.00)
GFR, Estimated: 55 mL/min — ABNORMAL LOW (ref >60)
Glucose, Bld: 96 mg/dL (ref 70–99)
Potassium: 4.4 mmol/L (ref 3.5–5.1)
Sodium: 139 mmol/L (ref 135–145)
Total Bilirubin: 0.4 mg/dL (ref 0.3–1.2)
Total Protein: 6.5 g/dL (ref 6.5–8.1)

## 2022-09-25 LAB — LACTATE DEHYDROGENASE: LDH: 121 U/L (ref 98–192)

## 2022-09-25 NOTE — Telephone Encounter (Signed)
Called patient 3/4 los notes to schedule f/u. Left voicemail with new appointment information and contact details if needing to reschedule.

## 2022-09-25 NOTE — Progress Notes (Signed)
HEMATOLOGY/ONCOLOGY CLINIC NOTE  Date of Service: 09/25/22   Patient Care Team: Orpah Melter, MD as PCP - General (Family Medicine)  CHIEF COMPLAINTS/PURPOSE OF CONSULTATION:  Follow-up for continued evaluation and management of CLL  HISTORY OF PRESENTING ILLNESS:  Please see previous note for details on initial presentation  INTERVAL HISTORY: Samantha Graham is a wonderful 77 y.o. female who is following up today regarding her Chronic Lymphocytic Leukemia.  Patient was last seen by me on 04/14/2022 and she was doing well overall.   Patient is accompanied by her husband during this visit. She reports she has been doing well overall without any new medical concerns since our last visit. She denies fever, chills, night sweats, infections issues, mouth sores, unexpected weight loss, new lumps/bumps, abnormal bowel movements, abdominal pain, chest pain, back pain, or leg swelling.   She notes she had basal cell carcinoma lesions near her nose surgically removed since our last visit. She regularly follows-up with her dermatologist. She also had an eye surgery since our last visit.   Patient reports she received a steroid injection for her left knee pain. She notes that her steroid injection helped relieve her pain.   She has received the influenza vaccine, but denies COVID-19 Booster and RSV vaccine.    MEDICAL HISTORY:  Past Medical History:  Diagnosis Date   Hyperlipidemia   Hypothyroidism  Rt frozen shoulder Osteopenia/osteoporosis  SURGICAL HISTORY: Past Surgical History:  Procedure Laterality Date   THYROIDECTOMY  1985  hysterectomy with BSO due to prolpased uterus Bunionectomy Meniscal injury -rt knee Bladder cyst removal  SOCIAL HISTORY: Social History   Socioeconomic History   Marital status: Married    Spouse name: Not on file   Number of children: Not on file   Years of education: Not on file   Highest education level: Not on file  Occupational  History   Not on file  Tobacco Use   Smoking status: Never   Smokeless tobacco: Never  Vaping Use   Vaping Use: Never used  Substance and Sexual Activity   Alcohol use: No   Drug use: No   Sexual activity: Not on file  Other Topics Concern   Not on file  Social History Narrative   Not on file   Social Determinants of Health   Financial Resource Strain: Not on file  Food Insecurity: Not on file  Transportation Needs: Not on file  Physical Activity: Not on file  Stress: Not on file  Social Connections: Not on file  Intimate Partner Violence: Not on file    FAMILY HISTORY: Family History  Problem Relation Age of Onset   Thyroid disease Mother    Cancer Father    Diabetes Cousin     ALLERGIES:  has No Known Allergies.  MEDICATIONS:  Current Outpatient Medications  Medication Sig Dispense Refill   B Complex-C-E-Zn (B COMPLEX-C-E-ZINC) tablet Take 1 tablet by mouth daily.     Cholecalciferol (VITAMIN D3) 5000 UNITS CAPS Take 5,000 Units by mouth once.     cycloSPORINE (RESTASIS) 0.05 % ophthalmic emulsion 1 drop 2 (two) times daily.     loteprednol (LOTEMAX) 0.5 % ophthalmic suspension 1 drop every morning.     meloxicam (MOBIC) 15 MG tablet Take 15 mg by mouth daily.     Nutritional Supplements (HI-CAL) LIQD Take by mouth.     Omega-3 Fatty Acids (FISH OIL) 1000 MG CAPS Take by mouth.     raloxifene (EVISTA) 60 MG tablet Take 1  tablet by mouth once daily 90 tablet 0   rosuvastatin (CRESTOR) 10 MG tablet TAKE 1 TABLET BY MOUTH EVERY DAY 90 tablet 1   SIMBRINZA 1-0.2 % SUSP Apply 1 drop to eye 2 (two) times daily.     thyroid (NP THYROID) 90 MG tablet Take 1/2 (one-half) tablet by mouth once daily 45 tablet 0   XIIDRA 5 % SOLN Apply 1 drop to eye 2 (two) times daily.     No current facility-administered medications for this visit.    REVIEW OF SYSTEMS:   10 Point review of Systems was done is negative except as noted above.  PHYSICAL EXAMINATION: ECOG PERFORMANCE  STATUS: 0 - Asymptomatic  Vitals:   09/25/22 1355  BP: 103/62  Pulse: 76  Resp: 18  Temp: 97.7 F (36.5 C)  SpO2: 98%   Filed Weights   09/25/22 1355  Weight: 111 lb 3.2 oz (50.4 kg)   .Body mass index is 20.34 kg/m.  NAD GENERAL:alert, in no acute distress and comfortable SKIN: no acute rashes, no significant lesions EYES: conjunctiva are pink and non-injected, sclera anicteric NECK: supple, no JVD LYMPH:  no palpable lymphadenopathy in the cervical, axillary or inguinal regions LUNGS: clear to auscultation b/l with normal respiratory effort HEART: regular rate & rhythm ABDOMEN:  normoactive bowel sounds , non tender, not distended. Extremity: no pedal edema PSYCH: alert & oriented x 3 with fluent speech NEURO: no focal motor/sensory deficits   LABORATORY DATA:  I have reviewed the data as listed  .    Latest Ref Rng & Units 09/25/2022    1:10 PM 04/14/2022   12:40 PM 10/11/2021    2:10 PM  CBC  WBC 4.0 - 10.5 K/uL 155.4  95.7  77.7   Hemoglobin 12.0 - 15.0 g/dL 12.3  12.5  12.7   Hematocrit 36.0 - 46.0 % 39.2  38.9  38.6   Platelets 150 - 400 K/uL 179  170  167    . CBC    Component Value Date/Time   WBC 155.4 (HH) 09/25/2022 1310   WBC 57.4 (HH) 04/13/2021 1336   RBC 4.11 09/25/2022 1310   HGB 12.3 09/25/2022 1310   HCT 39.2 09/25/2022 1310   PLT 179 09/25/2022 1310   MCV 95.4 09/25/2022 1310   MCH 29.9 09/25/2022 1310   MCHC 31.4 09/25/2022 1310   RDW 15.0 09/25/2022 1310   LYMPHSABS 148.9 (H) 09/25/2022 1310   MONOABS 1.8 (H) 09/25/2022 1310   EOSABS 0.1 09/25/2022 1310   BASOSABS 0.1 09/25/2022 1310     .    Latest Ref Rng & Units 09/25/2022    1:10 PM 07/11/2022    1:36 PM 04/14/2022   12:40 PM  CMP  Glucose 70 - 99 mg/dL 96  95  116   BUN 8 - 23 mg/dL 26  29  33   Creatinine 0.44 - 1.00 mg/dL 1.05  0.99  1.26   Sodium 135 - 145 mmol/L 139  141  139   Potassium 3.5 - 5.1 mmol/L 4.4  4.5  4.5   Chloride 98 - 111 mmol/L 104  105  105   CO2  22 - 32 mmol/L '30  29  31   '$ Calcium 8.9 - 10.3 mg/dL 9.6  9.4  9.3   Total Protein 6.5 - 8.1 g/dL 6.5   6.3   Total Bilirubin 0.3 - 1.2 mg/dL 0.4   0.4   Alkaline Phos 38 - 126 U/L 39   38  AST 15 - 41 U/L 24   37   ALT 0 - 44 U/L 16   21    . Lab Results  Component Value Date   LDH 121 09/25/2022         FISH:     RADIOGRAPHIC STUDIES: I have personally reviewed the radiological images as listed and agreed with the findings in the report. No results found.  ASSESSMENT & PLAN:   77 y.o. 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 No constitutional symptoms. Newly noted - incidentally on annual labs. No older labs available for reference.  FISH Prognostic panel revealed a monoallelic 0000000 deletion which  might suggest a more indolent course for her CLL  11/19/17 CT C/A/P revealed no changes in spleen size and no overtly pathologic adenopathy is identified. One upper normal sized lymph node is a 9 mm in short axis left axillary lymph node.   . Lab Results  Component Value Date   LDH 123 04/14/2022   PLAN: -Discussed lab results from today, 09/25/2022, with the patient. CBC shows elevated WBC of 155.4 from 95.7 since last visit. CMP is stable.  -Recommended COVID-19 Booster and RSV vaccine.  -No lab or clinic evidence of significant  symptomatic CLL progression at this time. Will continue watchful observation.  -Recommended that the pt continue to eat well, drink at least 48-64 oz of water each day, and walk 20-30 minutes each day.  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  -Answered all of  patient's questions.   FOLLOW-UP: Phone visit with Dr Irene Limbo in 3 months Labs 1 day prior to phone visit   The total time spent in the appointment was 20 minutes* .  All of the patient's questions were answered with apparent satisfaction. The patient knows to call the clinic with any problems, questions or concerns.   Sullivan Lone MD MS AAHIVMS [facility] Hematology/Oncology Physician [facility]  .*Total Encounter Time as defined by the Centers for Medicare and Medicaid Services includes, in addition to the face-to-face time of a patient visit (documented in the note above) non-face-to-face time: obtaining and reviewing outside history, ordering and reviewing medications, tests or procedures, care coordination (communications with other health care professionals or caregivers) and documentation in the medical record.   I, Cleda Mccreedy, am acting as a Education administrator for Sullivan Lone, MD. .I have reviewed the above documentation for accuracy and completeness, and I agree with the above. Brunetta Genera MD

## 2022-09-28 DIAGNOSIS — L905 Scar conditions and fibrosis of skin: Secondary | ICD-10-CM | POA: Diagnosis not present

## 2022-10-23 ENCOUNTER — Other Ambulatory Visit: Payer: Self-pay | Admitting: Endocrinology

## 2022-11-06 DIAGNOSIS — J029 Acute pharyngitis, unspecified: Secondary | ICD-10-CM | POA: Diagnosis not present

## 2022-11-06 DIAGNOSIS — R051 Acute cough: Secondary | ICD-10-CM | POA: Diagnosis not present

## 2022-11-06 DIAGNOSIS — Z856 Personal history of leukemia: Secondary | ICD-10-CM | POA: Diagnosis not present

## 2022-11-06 DIAGNOSIS — S6992XA Unspecified injury of left wrist, hand and finger(s), initial encounter: Secondary | ICD-10-CM | POA: Diagnosis not present

## 2022-11-06 DIAGNOSIS — Z03818 Encounter for observation for suspected exposure to other biological agents ruled out: Secondary | ICD-10-CM | POA: Diagnosis not present

## 2022-11-06 DIAGNOSIS — R0981 Nasal congestion: Secondary | ICD-10-CM | POA: Diagnosis not present

## 2022-11-18 ENCOUNTER — Other Ambulatory Visit: Payer: Self-pay | Admitting: Endocrinology

## 2022-11-29 DIAGNOSIS — E538 Deficiency of other specified B group vitamins: Secondary | ICD-10-CM | POA: Diagnosis not present

## 2022-11-30 DIAGNOSIS — L905 Scar conditions and fibrosis of skin: Secondary | ICD-10-CM | POA: Diagnosis not present

## 2022-12-26 ENCOUNTER — Telehealth: Payer: Self-pay | Admitting: Hematology

## 2022-12-26 NOTE — Telephone Encounter (Signed)
Patient is aware of upcoming appointment times/dates.  

## 2022-12-28 ENCOUNTER — Inpatient Hospital Stay: Payer: PPO

## 2022-12-29 ENCOUNTER — Inpatient Hospital Stay: Payer: PPO | Admitting: Hematology

## 2023-01-10 DIAGNOSIS — D225 Melanocytic nevi of trunk: Secondary | ICD-10-CM | POA: Diagnosis not present

## 2023-01-10 DIAGNOSIS — Z85828 Personal history of other malignant neoplasm of skin: Secondary | ICD-10-CM | POA: Diagnosis not present

## 2023-01-10 DIAGNOSIS — L578 Other skin changes due to chronic exposure to nonionizing radiation: Secondary | ICD-10-CM | POA: Diagnosis not present

## 2023-01-10 DIAGNOSIS — W908XXS Exposure to other nonionizing radiation, sequela: Secondary | ICD-10-CM | POA: Diagnosis not present

## 2023-01-10 DIAGNOSIS — L570 Actinic keratosis: Secondary | ICD-10-CM | POA: Diagnosis not present

## 2023-01-10 DIAGNOSIS — L820 Inflamed seborrheic keratosis: Secondary | ICD-10-CM | POA: Diagnosis not present

## 2023-01-15 DIAGNOSIS — C9110 Chronic lymphocytic leukemia of B-cell type not having achieved remission: Secondary | ICD-10-CM | POA: Diagnosis not present

## 2023-01-23 DIAGNOSIS — M25561 Pain in right knee: Secondary | ICD-10-CM | POA: Diagnosis not present

## 2023-01-28 ENCOUNTER — Other Ambulatory Visit: Payer: Self-pay | Admitting: Endocrinology

## 2023-02-06 DIAGNOSIS — Z0000 Encounter for general adult medical examination without abnormal findings: Secondary | ICD-10-CM | POA: Diagnosis not present

## 2023-02-06 DIAGNOSIS — R5382 Chronic fatigue, unspecified: Secondary | ICD-10-CM | POA: Diagnosis not present

## 2023-02-06 DIAGNOSIS — M1990 Unspecified osteoarthritis, unspecified site: Secondary | ICD-10-CM | POA: Diagnosis not present

## 2023-02-06 DIAGNOSIS — E785 Hyperlipidemia, unspecified: Secondary | ICD-10-CM | POA: Diagnosis not present

## 2023-02-06 DIAGNOSIS — R0602 Shortness of breath: Secondary | ICD-10-CM | POA: Diagnosis not present

## 2023-02-06 DIAGNOSIS — M810 Age-related osteoporosis without current pathological fracture: Secondary | ICD-10-CM | POA: Diagnosis not present

## 2023-02-06 DIAGNOSIS — C9110 Chronic lymphocytic leukemia of B-cell type not having achieved remission: Secondary | ICD-10-CM | POA: Diagnosis not present

## 2023-02-06 DIAGNOSIS — I700 Atherosclerosis of aorta: Secondary | ICD-10-CM | POA: Diagnosis not present

## 2023-02-06 DIAGNOSIS — E039 Hypothyroidism, unspecified: Secondary | ICD-10-CM | POA: Diagnosis not present

## 2023-02-06 DIAGNOSIS — R530 Neoplastic (malignant) related fatigue: Secondary | ICD-10-CM | POA: Diagnosis not present

## 2023-02-06 DIAGNOSIS — N951 Menopausal and female climacteric states: Secondary | ICD-10-CM | POA: Diagnosis not present

## 2023-02-19 ENCOUNTER — Other Ambulatory Visit: Payer: Self-pay | Admitting: Endocrinology

## 2023-02-28 ENCOUNTER — Other Ambulatory Visit: Payer: Self-pay | Admitting: Family Medicine

## 2023-02-28 DIAGNOSIS — Z0000 Encounter for general adult medical examination without abnormal findings: Secondary | ICD-10-CM

## 2023-03-06 DIAGNOSIS — E538 Deficiency of other specified B group vitamins: Secondary | ICD-10-CM | POA: Diagnosis not present

## 2023-03-27 ENCOUNTER — Ambulatory Visit: Payer: PPO

## 2023-04-06 ENCOUNTER — Other Ambulatory Visit: Payer: Self-pay

## 2023-04-06 DIAGNOSIS — C9110 Chronic lymphocytic leukemia of B-cell type not having achieved remission: Secondary | ICD-10-CM

## 2023-04-06 DIAGNOSIS — E538 Deficiency of other specified B group vitamins: Secondary | ICD-10-CM | POA: Diagnosis not present

## 2023-04-09 ENCOUNTER — Telehealth: Payer: Self-pay

## 2023-04-09 ENCOUNTER — Inpatient Hospital Stay: Payer: PPO | Attending: Hematology

## 2023-04-09 DIAGNOSIS — C9110 Chronic lymphocytic leukemia of B-cell type not having achieved remission: Secondary | ICD-10-CM | POA: Insufficient documentation

## 2023-04-09 LAB — CBC WITH DIFFERENTIAL (CANCER CENTER ONLY)
Abs Immature Granulocytes: 0.22 10*3/uL — ABNORMAL HIGH (ref 0.00–0.07)
Basophils Absolute: 0.1 10*3/uL (ref 0.0–0.1)
Basophils Relative: 0 %
Eosinophils Absolute: 0.1 10*3/uL (ref 0.0–0.5)
Eosinophils Relative: 0 %
HCT: 40.6 % (ref 36.0–46.0)
Hemoglobin: 12.3 g/dL (ref 12.0–15.0)
Immature Granulocytes: 0 %
Lymphocytes Relative: 96 %
Lymphs Abs: 167 10*3/uL — ABNORMAL HIGH (ref 0.7–4.0)
MCH: 30.2 pg (ref 26.0–34.0)
MCHC: 30.3 g/dL (ref 30.0–36.0)
MCV: 99.8 fL (ref 80.0–100.0)
Monocytes Absolute: 1.4 10*3/uL — ABNORMAL HIGH (ref 0.1–1.0)
Monocytes Relative: 1 %
Neutro Abs: 4.1 10*3/uL (ref 1.7–7.7)
Neutrophils Relative %: 3 %
Platelet Count: 179 10*3/uL (ref 150–400)
RBC: 4.07 MIL/uL (ref 3.87–5.11)
RDW: 16.5 % — ABNORMAL HIGH (ref 11.5–15.5)
Smear Review: NORMAL
WBC Count: 172.9 10*3/uL (ref 4.0–10.5)
nRBC: 0 % (ref 0.0–0.2)

## 2023-04-09 LAB — CMP (CANCER CENTER ONLY)
ALT: 16 U/L (ref 0–44)
AST: 21 U/L (ref 15–41)
Albumin: 4.1 g/dL (ref 3.5–5.0)
Alkaline Phosphatase: 41 U/L (ref 38–126)
Anion gap: 5 (ref 5–15)
BUN: 27 mg/dL — ABNORMAL HIGH (ref 8–23)
CO2: 29 mmol/L (ref 22–32)
Calcium: 9.4 mg/dL (ref 8.9–10.3)
Chloride: 108 mmol/L (ref 98–111)
Creatinine: 1.05 mg/dL — ABNORMAL HIGH (ref 0.44–1.00)
GFR, Estimated: 55 mL/min — ABNORMAL LOW (ref >60)
Glucose, Bld: 107 mg/dL — ABNORMAL HIGH (ref 70–99)
Potassium: 4.4 mmol/L (ref 3.5–5.1)
Sodium: 142 mmol/L (ref 135–145)
Total Bilirubin: 0.4 mg/dL (ref 0.3–1.2)
Total Protein: 6.4 g/dL — ABNORMAL LOW (ref 6.5–8.1)

## 2023-04-09 LAB — LACTATE DEHYDROGENASE: LDH: 124 U/L (ref 98–192)

## 2023-04-09 NOTE — Telephone Encounter (Signed)
CRITICAL VALUE STICKER  CRITICAL VALUE: WBC 172.9  RECEIVER (on-site recipient of call): Sharlette Dense CMA  DATE & TIME NOTIFIED: 04/09/2023 1316  MESSENGER (representative from lab): Pam in lab  MD NOTIFIED: Dr. Candise Che  TIME OF NOTIFICATION:  1318  RESPONSE: Made nurse aware

## 2023-04-10 ENCOUNTER — Telehealth: Payer: PPO | Admitting: Hematology

## 2023-04-12 ENCOUNTER — Ambulatory Visit
Admission: RE | Admit: 2023-04-12 | Discharge: 2023-04-12 | Disposition: A | Discharge status: Home / Self Care | Payer: PPO | Source: Ambulatory Visit | Attending: Family Medicine | Admitting: Family Medicine

## 2023-04-12 ENCOUNTER — Ambulatory Visit: Payer: PPO

## 2023-04-12 DIAGNOSIS — Z0000 Encounter for general adult medical examination without abnormal findings: Secondary | ICD-10-CM

## 2023-04-12 DIAGNOSIS — Z1231 Encounter for screening mammogram for malignant neoplasm of breast: Secondary | ICD-10-CM | POA: Diagnosis not present

## 2023-04-13 ENCOUNTER — Other Ambulatory Visit: Payer: Self-pay

## 2023-04-13 MED ORDER — THYROID 90 MG PO TABS: ORAL_TABLET | ORAL | 1 refills | Status: DC

## 2023-04-16 ENCOUNTER — Inpatient Hospital Stay (HOSPITAL_BASED_OUTPATIENT_CLINIC_OR_DEPARTMENT_OTHER): Payer: PPO | Admitting: Hematology

## 2023-04-16 DIAGNOSIS — C9110 Chronic lymphocytic leukemia of B-cell type not having achieved remission: Secondary | ICD-10-CM

## 2023-04-16 NOTE — Progress Notes (Signed)
HEMATOLOGY/ONCOLOGY TELE-MED VISIT NOTE  Date of Service: 04/16/23   Patient Care Team: Joycelyn Rua, MD as PCP - General (Family Medicine)  CHIEF COMPLAINTS/PURPOSE OF CONSULTATION:  Follow-up for continued evaluation and management of CLL  HISTORY OF PRESENTING ILLNESS:  Please see previous note for details on initial presentation  INTERVAL HISTORY: Samantha Graham is a wonderful 77 y.o. female who is connected via phone today regarding her Chronic Lymphocytic Leukemia.  .I connected with Samantha Graham on 04/16/2023 at  8:40 AM EDT by telephone visit and verified that I am speaking with the correct person using two identifiers.   Patient was last seen by me on 09/25/2022 and she was doing well overall.  Patient notes she has been doing well overall since our last visit. She does complain of mild to moderate fatigue. She denies any new infection issues, fever, chills, night sweats, unexpected weight loss, abdominal pain, abdominal distension, chest pain, abnormal bowel movement, back pain, or leg swelling.   I discussed lab results from 04/09/2023 in detail with the patient.   I discussed the limitations, risks, security and privacy concerns of performing an evaluation and management service by telemedicine and the availability of in-person appointments. I also discussed with the patient that there may be a patient responsible charge related to this service. The patient expressed understanding and agreed to proceed.   Other persons participating in the visit and their role in the encounter: None   Patient's location: Home  Provider's location: [facility]   Chief Complaint:  Chronic Lymphocytic Leukemia.    MEDICAL HISTORY:  Past Medical History:  Diagnosis Date   Hyperlipidemia   Hypothyroidism  Rt frozen shoulder Osteopenia/osteoporosis  SURGICAL HISTORY: Past Surgical History:  Procedure Laterality Date   THYROIDECTOMY  1985  hysterectomy with BSO due to  prolpased uterus Bunionectomy Meniscal injury -rt knee Bladder cyst removal  SOCIAL HISTORY: Social History   Socioeconomic History   Marital status: Married    Spouse name: Not on file   Number of children: Not on file   Years of education: Not on file   Highest education level: Not on file  Occupational History   Not on file  Tobacco Use   Smoking status: Never   Smokeless tobacco: Never  Vaping Use   Vaping status: Never Used  Substance and Sexual Activity   Alcohol use: No   Drug use: No   Sexual activity: Not on file  Other Topics Concern   Not on file  Social History Narrative   Not on file   Social Determinants of Health   Financial Resource Strain: Not on file  Food Insecurity: Not on file  Transportation Needs: Not on file  Physical Activity: Not on file  Stress: Not on file  Social Connections: Unknown (12/05/2021)   Received from [facility]   Social Network    Social Network: Not on file  Intimate Partner Violence: Unknown (10/26/2021)   Received from [facility], [facility]   HITS    Physically Hurt: Not on file    Insult or Talk Down To: Not on file    Threaten Physical Harm: Not on file    Scream or Curse: Not on file    FAMILY HISTORY: Family History  Problem Relation Age of Onset   Thyroid disease Mother    Cancer Father    Diabetes Cousin    Breast cancer Neg Hx     ALLERGIES:  has No Known Allergies.  MEDICATIONS:  Current Outpatient Medications  Medication Sig Dispense Refill   B Complex-C-E-Zn (B COMPLEX-C-E-ZINC) tablet Take 1 tablet by mouth daily.     Cholecalciferol (VITAMIN D3) 5000 UNITS CAPS Take 5,000 Units by mouth once.     cycloSPORINE (RESTASIS) 0.05 % ophthalmic emulsion 1 drop 2 (two) times daily.     loteprednol (LOTEMAX) 0.5 % ophthalmic suspension 1 drop every morning.     meloxicam (MOBIC) 15 MG tablet Take 15 mg by mouth daily.     MIEBO 1.338 GM/ML SOLN Place into both eyes QID.      Nutritional Supplements (HI-CAL) LIQD Take by mouth.     Omega-3 Fatty Acids (FISH OIL) 1000 MG CAPS Take by mouth.     raloxifene (EVISTA) 60 MG tablet Take 1 tablet by mouth once daily 90 tablet 0   rosuvastatin (CRESTOR) 10 MG tablet TAKE 1 TABLET BY MOUTH EVERY DAY 90 tablet 1   SIMBRINZA 1-0.2 % SUSP Apply 1 drop to eye 2 (two) times daily.     thyroid (NP THYROID) 90 MG tablet Take 1/2 (one-half) tablet by mouth once daily 90 tablet 1   XIIDRA 5 % SOLN Apply 1 drop to eye 2 (two) times daily.     No current facility-administered medications for this visit.    REVIEW OF SYSTEMS:   10 Point review of Systems was done is negative except as noted above.  PHYSICAL EXAMINATION:TELE-MED VISIT LABORATORY DATA:  I have reviewed the data as listed  .    Latest Ref Rng & Units 04/09/2023   12:56 PM 09/25/2022    1:10 PM 04/14/2022   12:40 PM  CBC  WBC 4.0 - 10.5 K/uL 172.9  155.4  95.7   Hemoglobin 12.0 - 15.0 g/dL 81.1  91.4  78.2   Hematocrit 36.0 - 46.0 % 40.6  39.2  38.9   Platelets 150 - 400 K/uL 179  179  170    . CBC    Component Value Date/Time   WBC 172.9 (HH) 04/09/2023 1256   WBC 57.4 (HH) 04/13/2021 1336   RBC 4.07 04/09/2023 1256   HGB 12.3 04/09/2023 1256   HCT 40.6 04/09/2023 1256   PLT 179 04/09/2023 1256   MCV 99.8 04/09/2023 1256   MCH 30.2 04/09/2023 1256   MCHC 30.3 04/09/2023 1256   RDW 16.5 (H) 04/09/2023 1256   LYMPHSABS 167.0 (H) 04/09/2023 1256   MONOABS 1.4 (H) 04/09/2023 1256   EOSABS 0.1 04/09/2023 1256   BASOSABS 0.1 04/09/2023 1256     .    Latest Ref Rng & Units 04/09/2023   12:56 PM 09/25/2022    1:10 PM 07/11/2022    1:36 PM  CMP  Glucose 70 - 99 mg/dL 956  96  95   BUN 8 - 23 mg/dL 27  26  29    Creatinine 0.44 - 1.00 mg/dL 2.13  0.86  5.78   Sodium 135 - 145 mmol/L 142  139  141   Potassium 3.5 - 5.1 mmol/L 4.4  4.4  4.5   Chloride 98 - 111 mmol/L 108  104  105   CO2 22 - 32 mmol/L 29  30  29    Calcium 8.9 - 10.3 mg/dL 9.4  9.6   9.4   Total Protein 6.5 - 8.1 g/dL 6.4  6.5    Total Bilirubin 0.3 - 1.2 mg/dL 0.4  0.4    Alkaline Phos 38 - 126 U/L 41  39    AST 15 -  41 U/L 21  24    ALT 0 - 44 U/L 16  16     . Lab Results  Component Value Date   LDH 124 04/09/2023         FISH:     RADIOGRAPHIC STUDIES: I have personally reviewed the radiological images as listed and agreed with the findings in the report. MM 3D SCREENING MAMMOGRAM BILATERAL BREAST  Result Date: 04/13/2023 CLINICAL DATA:  Screening. EXAM: DIGITAL SCREENING BILATERAL MAMMOGRAM WITH TOMOSYNTHESIS AND CAD TECHNIQUE: Bilateral screening digital craniocaudal and mediolateral oblique mammograms were obtained. Bilateral screening digital breast tomosynthesis was performed. The images were evaluated with computer-aided detection. COMPARISON:  Previous exam(s). ACR Breast Density Category b: There are scattered areas of fibroglandular density. FINDINGS: There are no findings suspicious for malignancy. IMPRESSION: No mammographic evidence of malignancy. A result letter of this screening mammogram will be mailed directly to the patient. RECOMMENDATION: Screening mammogram in one year. (Code:SM-B-01Y) BI-RADS CATEGORY  1: Negative. Electronically Signed   By: Harmon Pier M.D.   On: 04/13/2023 16:08    ASSESSMENT & PLAN:   77 y.o. 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 No constitutional symptoms. Newly noted - incidentally on annual labs. No older labs available for reference.  FISH Prognostic panel revealed a monoallelic 13q deletion which  might suggest a more indolent course for her CLL  11/19/17 CT C/A/P revealed no changes in spleen size and no  overtly pathologic adenopathy is identified. One upper normal sized lymph node is a 9 mm in short axis left axillary lymph node.   [name]  PLAN: Discussed lab results from 04/09/2023 in detail with the patient. CBC shows elevated WBC at 172.9 K. CMP shows slightly elevated glucose level at 107, elevated BUN at 27, and elevated creatinine of 1.05. LDH at 124. No anemia or thrombocytopenia.  -No indication to start treatment.  -Patient will call us if her fatigue worsens.  -Will continue to monitor with labs every 3 months.  -No lab or clinic evidence of significant  symptomatic CLL progression at this time. Will continue watchful observation.  -Recommended that the pt continue to eat well, drink at least 48-64 oz of water each day, and walk 20-30 minutes each day.  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  -Answered all of patient's questions.   FOLLOW-UP: RTC with Dr Candise Che with labs in 3 months  The total time spent in the appointment was 20 minutes* .  All of the patient's questions were answered with apparent satisfaction. The patient knows to call the clinic with any problems, questions or concerns.   Wyvonnia Lora MD MS AAHIVMS [facility] Hematology/Oncology Physician [facility]  .*Total Encounter Time as defined by the Centers for Medicare and Medicaid Services includes, in addition to the face-to-face time of a patient visit (documented in the note above) non-face-to-face time: obtaining and reviewing outside history, ordering and reviewing medications, tests or procedures, care coordination (communications with other health care professionals or caregivers) and documentation in the medical record.   I,Param Shah,acting as a Neurosurgeon for Wyvonnia Lora, MD.,have documented all relevant documentation on the behalf of Wyvonnia Lora, MD,as directed by  Wyvonnia Lora, MD while in the presence of Wyvonnia Lora,  MD.   .I have reviewed the above documentation for accuracy and completeness, and I agree with the above. Johney Maine MD

## 2023-05-04 DIAGNOSIS — E538 Deficiency of other specified B group vitamins: Secondary | ICD-10-CM | POA: Diagnosis not present

## 2023-05-08 ENCOUNTER — Telehealth: Payer: Self-pay | Admitting: Hematology

## 2023-05-08 NOTE — Telephone Encounter (Signed)
Left patient a message in regards to scheduled appointment times/dates for follow up

## 2023-05-17 DIAGNOSIS — Z23 Encounter for immunization: Secondary | ICD-10-CM | POA: Diagnosis not present

## 2023-05-28 ENCOUNTER — Other Ambulatory Visit: Payer: Self-pay

## 2023-05-28 DIAGNOSIS — M810 Age-related osteoporosis without current pathological fracture: Secondary | ICD-10-CM

## 2023-05-28 DIAGNOSIS — M25561 Pain in right knee: Secondary | ICD-10-CM | POA: Diagnosis not present

## 2023-05-28 MED ORDER — RALOXIFENE HCL 60 MG PO TABS: 60.0000 mg | ORAL_TABLET | Freq: Every day | ORAL | 0 refills | Status: DC

## 2023-06-05 DIAGNOSIS — E538 Deficiency of other specified B group vitamins: Secondary | ICD-10-CM | POA: Diagnosis not present

## 2023-06-06 ENCOUNTER — Encounter: Payer: Self-pay | Admitting: Endocrinology

## 2023-06-06 ENCOUNTER — Other Ambulatory Visit: Payer: Self-pay

## 2023-06-06 DIAGNOSIS — M810 Age-related osteoporosis without current pathological fracture: Secondary | ICD-10-CM

## 2023-06-06 MED ORDER — RALOXIFENE HCL 60 MG PO TABS: 60.0000 mg | ORAL_TABLET | Freq: Every day | ORAL | 0 refills | Status: DC

## 2023-07-02 ENCOUNTER — Other Ambulatory Visit: Payer: Self-pay

## 2023-07-02 DIAGNOSIS — E7800 Pure hypercholesterolemia, unspecified: Secondary | ICD-10-CM

## 2023-07-02 DIAGNOSIS — E890 Postprocedural hypothyroidism: Secondary | ICD-10-CM

## 2023-07-02 DIAGNOSIS — M810 Age-related osteoporosis without current pathological fracture: Secondary | ICD-10-CM

## 2023-07-02 DIAGNOSIS — E559 Vitamin D deficiency, unspecified: Secondary | ICD-10-CM

## 2023-07-03 ENCOUNTER — Other Ambulatory Visit: Payer: PPO

## 2023-07-03 DIAGNOSIS — M810 Age-related osteoporosis without current pathological fracture: Secondary | ICD-10-CM | POA: Diagnosis not present

## 2023-07-03 DIAGNOSIS — E7800 Pure hypercholesterolemia, unspecified: Secondary | ICD-10-CM | POA: Diagnosis not present

## 2023-07-03 DIAGNOSIS — E890 Postprocedural hypothyroidism: Secondary | ICD-10-CM | POA: Diagnosis not present

## 2023-07-03 DIAGNOSIS — E559 Vitamin D deficiency, unspecified: Secondary | ICD-10-CM | POA: Diagnosis not present

## 2023-07-04 LAB — BASIC METABOLIC PANEL
BUN/Creatinine Ratio: 32 (calc) — ABNORMAL HIGH (ref 6–22)
BUN: 29 mg/dL — ABNORMAL HIGH (ref 7–25)
CO2: 28 mmol/L (ref 20–32)
Calcium: 9.6 mg/dL (ref 8.6–10.4)
Chloride: 105 mmol/L (ref 98–110)
Creat: 0.92 mg/dL (ref 0.60–1.00)
Glucose, Bld: 90 mg/dL (ref 65–99)
Potassium: 4.4 mmol/L (ref 3.5–5.3)
Sodium: 141 mmol/L (ref 135–146)

## 2023-07-04 LAB — LIPID PANEL
Cholesterol: 185 mg/dL (ref <200)
HDL: 61 mg/dL (ref >=50)
LDL Cholesterol (Calc): 95 mg/dL
Non-HDL Cholesterol (Calc): 124 mg/dL (ref <130)
Total CHOL/HDL Ratio: 3 (calc) (ref <5.0)
Triglycerides: 196 mg/dL — ABNORMAL HIGH (ref <150)

## 2023-07-04 LAB — VITAMIN D 25 HYDROXY (VIT D DEFICIENCY, FRACTURES): Vit D, 25-Hydroxy: 57 ng/mL (ref 30–100)

## 2023-07-04 LAB — TSH: TSH: 2.81 m[IU]/L (ref 0.40–4.50)

## 2023-07-04 LAB — T4, FREE: Free T4: 0.8 ng/dL (ref 0.8–1.8)

## 2023-07-06 ENCOUNTER — Ambulatory Visit: Payer: PPO | Admitting: Endocrinology

## 2023-07-06 ENCOUNTER — Encounter: Payer: Self-pay | Admitting: Endocrinology

## 2023-07-06 VITALS — BP 90/60 | HR 70 | Resp 20 | Ht 62.0 in | Wt 109.2 lb

## 2023-07-06 DIAGNOSIS — E559 Vitamin D deficiency, unspecified: Secondary | ICD-10-CM | POA: Diagnosis not present

## 2023-07-06 DIAGNOSIS — E7800 Pure hypercholesterolemia, unspecified: Secondary | ICD-10-CM

## 2023-07-06 DIAGNOSIS — E890 Postprocedural hypothyroidism: Secondary | ICD-10-CM | POA: Diagnosis not present

## 2023-07-06 DIAGNOSIS — M810 Age-related osteoporosis without current pathological fracture: Secondary | ICD-10-CM

## 2023-07-06 MED ORDER — THYROID 90 MG PO TABS: ORAL_TABLET | ORAL | 3 refills | Status: DC

## 2023-07-06 NOTE — Progress Notes (Signed)
Outpatient Endocrinology Note [place] Mieshia Pepitone, MD  07/06/23  Patient's Name: Samantha Graham    DOB: Apr 16, 1946    MRN: [id number]  REASON OF VISIT: Follow-up for hypothyroidism /osteoporosis  PCP: Joycelyn Rua, MD  HISTORY OF PRESENT ILLNESS:   Samantha Graham is a 77 y.o. old female with past medical history as listed below is presented for a follow up for hypothyroidism /osteoporosis.   Pertinent History: Patient was previously seen by Dr. Lucianne Muss and was last time seen in December 2023.  # Postsurgical hypothyroidism -Patient had a history of hypothyroidism and thyroid nodule treated with thyroidectomy and ?  Radioactive iodine, no details record available to review.  In 2001 she was taking levothyroxine / brand name Synthroid 88 mcg daily.  Due to extreme fatigue on levothyroxine she was started empirically on Armour Thyroid in October 2015 with 45 mg daily.  She had improvement on fatigue and less hair loss after being on Armour Thyroid. Because of her complaining of significant fatigue she was given a trial of levothyroxine 37.5 mcg with 5 mcg of Cytomel instead of 45 Armour Thyroid but she preferred to go back to thyroid for better energy level.  # Osteoporosis:   Baseline bone density done by gynecologist showed T score was -3.0.  Bone density on 08/08/16 shows T scores of -2.4 at spine (improved) and -2.2 at hip, stable. Report from 04/29/2019: Bone density at spine is --1.9, Bone density at the hip -2.4 on the left side and -2.0 on the right   Most recent bone density result:   11/29/2021 Lumbar spine L1-L3 (L4) Femoral neck (FN) 33% distal radius  T-score -2.1 RFN: -1.9 LFN: -2.3 n/a  Change in BMD from previous DXA test (%) +3.0%   +2.0% n/a    She did not want to take Fosamax because it did not work with her mother apparently. She was given ACTONEL in 2017 and again 2018 but stopped this because of her perception that she was having joint pains after taking this after a  few prescriptions. Also concerned about possible hair loss as a side effect. Reclast was given in 5/16, she did have some nausea the next day Does not want to take Reclast again because she thinks it made her hair thin out.   She was started on Evista in 12/20 and she continues to take this regularly. No complaints of hot flashes with this.    Vitamin D supplements: She is taking 5000 units daily. Vitamin D level has been normal.   # Hypercholesterolemia:  Her baseline LDL was 210 and she is being treated with Crestor 10 mg. Has been following a low-cholesterol low-fat diet consistently.  Note : CLL: She is being followed by oncologist for stable disease and no treatment recommended.   Interval history Patient has been taking NP thyroid, denies palpitation or heat intolerance.  No following fracture.  She has been taking vitamin D 3 5000 international unit daily.  She has not been taking calcium supplement at this time.  He does not eat much dairy products.  No fall and fracture.  She has been taking Evista.  No other complaints today.  Recent lab results reviewed.  Electrolytes normal, mildly elevated BUN likely dehydration.  Lipids level acceptable with LDL 95.  Triglyceride is elevated likely nonfasting sample.  Vitamin D is normal at 57.  Thyroid function test normal.  REVIEW OF SYSTEMS:  As per history of present illness.   PAST MEDICAL HISTORY: Past Medical  History:  Diagnosis Date   Hyperlipidemia     PAST SURGICAL HISTORY: Past Surgical History:  Procedure Laterality Date   THYROIDECTOMY  1985    ALLERGIES: No Known Allergies  FAMILY HISTORY:  Family History  Problem Relation Age of Onset   Thyroid disease Mother    Cancer Father    Diabetes Cousin    Breast cancer Neg Hx     SOCIAL HISTORY: Social History   Socioeconomic History   Marital status: Married    Spouse name: Not on file   Number of children: Not on file   Years of education: Not on file    Highest education level: Not on file  Occupational History   Not on file  Tobacco Use   Smoking status: Never   Smokeless tobacco: Never  Vaping Use   Vaping status: Never Used  Substance and Sexual Activity   Alcohol use: No   Drug use: No   Sexual activity: Not on file  Other Topics Concern   Not on file  Social History Narrative   Not on file   Social Drivers of Health   Financial Resource Strain: Not on file  Food Insecurity: Not on file  Transportation Needs: Not on file  Physical Activity: Not on file  Stress: Not on file  Social Connections: Unknown (12/05/2021)   Received from [facility]   Social Network    Social Network: Not on file    MEDICATIONS:  Current Outpatient Medications  Medication Sig Dispense Refill   B Complex-C-E-Zn (B COMPLEX-C-E-ZINC) tablet Take 1 tablet by mouth daily.     Cholecalciferol (VITAMIN D3) 5000 UNITS CAPS Take 5,000 Units by mouth once.     loteprednol (LOTEMAX) 0.5 % ophthalmic suspension 1 drop every morning.     meloxicam (MOBIC) 15 MG tablet Take 15 mg by mouth daily.     MIEBO 1.338 GM/ML SOLN Place into both eyes QID.     Nutritional Supplements (HI-CAL) LIQD Take by mouth.     Omega-3 Fatty Acids (FISH OIL) 1000 MG CAPS Take by mouth.     raloxifene (EVISTA) 60 MG tablet Take 1 tablet (60 mg total) by mouth daily. 90 tablet 0   rosuvastatin (CRESTOR) 10 MG tablet TAKE 1 TABLET BY MOUTH EVERY DAY 90 tablet 1   cycloSPORINE (RESTASIS) 0.05 % ophthalmic emulsion 1 drop 2 (two) times daily.     SIMBRINZA 1-0.2 % SUSP Apply 1 drop to eye 2 (two) times daily.     thyroid (NP THYROID) 90 MG tablet Take 1/2 (one-half) tablet by mouth once daily 45 tablet 3   XIIDRA 5 % SOLN Apply 1 drop to eye 2 (two) times daily.     No current facility-administered medications for this visit.    PHYSICAL EXAM: Vitals:   07/06/23 1038  BP: 90/60  Pulse: 70  Resp: 20  SpO2: 96%  Weight: 109 lb 3.2 oz (49.5 kg)  Height:  5\' 2"  (1.575 m)   Body mass index is 19.97 kg/m.  Wt Readings from Last 3 Encounters:  07/06/23 109 lb 3.2 oz (49.5 kg)  09/25/22 111 lb 3.2 oz (50.4 kg)  07/13/22 114 lb (51.7 kg)    General: Well developed, well nourished female in no apparent distress.  HEENT: AT/[place], no external lesions. Hearing intact to the spoken word Eyes: Conjunctiva clear and no icterus. Neck: Trachea midline, neck supple, anterior neck scar + Abdomen: Soft, non tender, non distended, no masses, no striae Neurologic:  Alert, oriented, normal speech, deep tendon biceps reflexes normal Extremities: No pedal pitting edema, no tremors of outstretched hands Skin: Warm, color good.  Psychiatric: Does not appear depressed or anxious  PERTINENT HISTORIC LABORATORY AND IMAGING STUDIES:  All pertinent laboratory results were reviewed. Please see HPI also for further details.   TSH  Date Value Ref Range Status  07/03/2023 2.81 0.40 - 4.50 mIU/L Final  07/11/2022 1.35 0.35 - 5.50 uIU/mL Final  01/10/2022 1.93 0.35 - 5.50 uIU/mL Final     ASSESSMENT / PLAN  1. Postsurgical hypothyroidism   2. Vitamin D deficiency   3. Age-related osteoporosis without current pathological fracture   4. Hypercholesterolemia    # Postsurgical hypothyroidism : -Discussed that Armour Thyroid/NP thyroid has increased risks of bone loss compared to levothyroxine. -She had recent normal thyroid function test.  Will continue current dose of NP thyroid 45 mg daily.  # Osteoporosis  -Continue Evista -Check DEXA scan prior to follow-up visit in 1 year. -Continue current dose of vitamin D3 supplement 5000 international unit daily. -Advised to take calcium 500 mg 2 times a day over-the-counter. -Discussed about fall precaution and weightbearing exercise.  # Hyperlipidemia -Continue Crestor 10 mg daily.   Diagnoses and all orders for this visit:  Postsurgical hypothyroidism -     T4, free -     T3, free -     TSH  Vitamin D  deficiency -     VITAMIN D 25 Hydroxy (Vit-D Deficiency, Fractures)  Age-related osteoporosis without current pathological fracture -     DG Bone Density; Future -     Renal function panel  Hypercholesterolemia  Other orders -     Discontinue: thyroid (NP THYROID) 90 MG tablet; Take 1/2 (one-half) tablet by mouth once daily -     thyroid (NP THYROID) 90 MG tablet; Take 1/2 (one-half) tablet by mouth once daily    DISPOSITION Follow up in clinic in 12 months suggested.  All questions answered and patient verbalized understanding of the plan.  [place] Janaiya Beauchesne, MD [facility] Endocrinology [facility] [address] [place], [address] [place], [place] [address] Phone # [phone]  At least part of this note was generated using voice recognition software. Inadvertent word errors may have occurred, which were not recognized during the proofreading process.

## 2023-07-06 NOTE — Patient Instructions (Signed)
Bone density in 1 year prior to follow visit.  Take calcium ~ 500 mg two times a day. Citrate or carbonate should be okay.

## 2023-07-09 DIAGNOSIS — E538 Deficiency of other specified B group vitamins: Secondary | ICD-10-CM | POA: Diagnosis not present

## 2023-07-11 DIAGNOSIS — L570 Actinic keratosis: Secondary | ICD-10-CM | POA: Diagnosis not present

## 2023-07-11 DIAGNOSIS — D3612 Benign neoplasm of peripheral nerves and autonomic nervous system, upper limb, including shoulder: Secondary | ICD-10-CM | POA: Diagnosis not present

## 2023-07-11 DIAGNOSIS — Z85828 Personal history of other malignant neoplasm of skin: Secondary | ICD-10-CM | POA: Diagnosis not present

## 2023-07-11 DIAGNOSIS — L811 Chloasma: Secondary | ICD-10-CM | POA: Diagnosis not present

## 2023-07-11 DIAGNOSIS — D485 Neoplasm of uncertain behavior of skin: Secondary | ICD-10-CM | POA: Diagnosis not present

## 2023-07-11 DIAGNOSIS — D1801 Hemangioma of skin and subcutaneous tissue: Secondary | ICD-10-CM | POA: Diagnosis not present

## 2023-07-11 DIAGNOSIS — L821 Other seborrheic keratosis: Secondary | ICD-10-CM | POA: Diagnosis not present

## 2023-07-13 DIAGNOSIS — L851 Acquired keratosis [keratoderma] palmaris et plantaris: Secondary | ICD-10-CM | POA: Diagnosis not present

## 2023-07-31 DIAGNOSIS — C9110 Chronic lymphocytic leukemia of B-cell type not having achieved remission: Secondary | ICD-10-CM | POA: Diagnosis not present

## 2023-08-22 ENCOUNTER — Other Ambulatory Visit: Payer: Self-pay

## 2023-08-22 ENCOUNTER — Telehealth: Payer: Self-pay | Admitting: Endocrinology

## 2023-08-22 DIAGNOSIS — E7800 Pure hypercholesterolemia, unspecified: Secondary | ICD-10-CM

## 2023-08-22 MED ORDER — ROSUVASTATIN CALCIUM 10 MG PO TABS: 10.0000 mg | ORAL_TABLET | Freq: Every day | ORAL | 1 refills | Status: DC

## 2023-08-22 NOTE — Telephone Encounter (Signed)
MEDICATION: Rosuvastatin   PHARMACY:  [facility] in [facility]  HAS THE PATIENT CONTACTED THEIR PHARMACY?  YES  IS THIS A 90 DAY SUPPLY : YES  IS PATIENT OUT OF MEDICATION: YES  IF NOT; HOW MUCH IS LEFT:   LAST APPOINTMENT DATE: @12 /13/2024  NEXT APPOINTMENT DATE:@12 /19/2025  DO WE HAVE YOUR PERMISSION TO LEAVE A DETAILED MESSAGE?:  OTHER COMMENTS:    **Let patient know to contact pharmacy at the end of the day to make sure medication is ready. **  ** Please notify patient to allow 48-72 hours to process**  **Encourage patient to contact the pharmacy for refills or they can request refills through [facility]**

## 2023-09-09 ENCOUNTER — Other Ambulatory Visit: Payer: Self-pay | Admitting: Endocrinology

## 2023-09-09 DIAGNOSIS — M810 Age-related osteoporosis without current pathological fracture: Secondary | ICD-10-CM

## 2023-09-24 ENCOUNTER — Other Ambulatory Visit: Payer: Self-pay

## 2023-09-24 DIAGNOSIS — M810 Age-related osteoporosis without current pathological fracture: Secondary | ICD-10-CM

## 2023-09-24 MED ORDER — RALOXIFENE HCL 60 MG PO TABS: 60.0000 mg | ORAL_TABLET | Freq: Every day | ORAL | 0 refills | Status: DC

## 2023-10-12 ENCOUNTER — Other Ambulatory Visit: Payer: Self-pay

## 2023-10-12 DIAGNOSIS — C9110 Chronic lymphocytic leukemia of B-cell type not having achieved remission: Secondary | ICD-10-CM

## 2023-10-15 ENCOUNTER — Inpatient Hospital Stay: Payer: PPO | Attending: Hematology

## 2023-10-15 ENCOUNTER — Inpatient Hospital Stay: Payer: PPO | Admitting: Hematology

## 2023-10-15 VITALS — BP 108/58 | HR 65 | Temp 98.2°F | Resp 17 | Ht 62.0 in | Wt 110.4 lb

## 2023-10-15 DIAGNOSIS — C9110 Chronic lymphocytic leukemia of B-cell type not having achieved remission: Secondary | ICD-10-CM | POA: Insufficient documentation

## 2023-10-15 DIAGNOSIS — Z79899 Other long term (current) drug therapy: Secondary | ICD-10-CM | POA: Diagnosis not present

## 2023-10-15 LAB — CBC WITH DIFFERENTIAL (CANCER CENTER ONLY)
Abs Immature Granulocytes: 0.19 10*3/uL — ABNORMAL HIGH (ref 0.00–0.07)
Basophils Absolute: 0 10*3/uL (ref 0.0–0.1)
Basophils Relative: 0 %
Eosinophils Absolute: 0.1 10*3/uL (ref 0.0–0.5)
Eosinophils Relative: 0 %
HCT: 40.6 % (ref 36.0–46.0)
Hemoglobin: 12.3 g/dL (ref 12.0–15.0)
Immature Granulocytes: 0 %
Lymphocytes Relative: 97 %
Lymphs Abs: 174 10*3/uL — ABNORMAL HIGH (ref 0.7–4.0)
MCH: 30.3 pg (ref 26.0–34.0)
MCHC: 30.3 g/dL (ref 30.0–36.0)
MCV: 100 fL (ref 80.0–100.0)
Monocytes Absolute: 2 10*3/uL — ABNORMAL HIGH (ref 0.1–1.0)
Monocytes Relative: 1 %
Neutro Abs: 4.1 10*3/uL (ref 1.7–7.7)
Neutrophils Relative %: 2 %
Platelet Count: 146 10*3/uL — ABNORMAL LOW (ref 150–400)
RBC: 4.06 MIL/uL (ref 3.87–5.11)
RDW: 16.2 % — ABNORMAL HIGH (ref 11.5–15.5)
Smear Review: NORMAL
WBC Count: 180.4 10*3/uL (ref 4.0–10.5)
nRBC: 0 % (ref 0.0–0.2)

## 2023-10-15 LAB — CMP (CANCER CENTER ONLY)
ALT: 16 U/L (ref 0–44)
AST: 22 U/L (ref 15–41)
Albumin: 4.3 g/dL (ref 3.5–5.0)
Alkaline Phosphatase: 36 U/L — ABNORMAL LOW (ref 38–126)
Anion gap: 5 (ref 5–15)
BUN: 24 mg/dL — ABNORMAL HIGH (ref 8–23)
CO2: 29 mmol/L (ref 22–32)
Calcium: 9.5 mg/dL (ref 8.9–10.3)
Chloride: 103 mmol/L (ref 98–111)
Creatinine: 1.03 mg/dL — ABNORMAL HIGH (ref 0.44–1.00)
GFR, Estimated: 56 mL/min — ABNORMAL LOW (ref >60)
Glucose, Bld: 94 mg/dL (ref 70–99)
Potassium: 4.4 mmol/L (ref 3.5–5.1)
Sodium: 137 mmol/L (ref 135–145)
Total Bilirubin: 0.4 mg/dL (ref 0.0–1.2)
Total Protein: 6.7 g/dL (ref 6.5–8.1)

## 2023-10-15 LAB — LACTATE DEHYDROGENASE: LDH: 136 U/L (ref 98–192)

## 2023-10-15 NOTE — Progress Notes (Signed)
 HEMATOLOGY/ONCOLOGY CLINIC VISIT NOTE  Date of Service: 10/15/23   Patient Care Team: Joycelyn Rua, MD as PCP - General (Family Medicine)  CHIEF COMPLAINTS/PURPOSE OF CONSULTATION:  Follow-up for continued evaluation and management of CLL  HISTORY OF PRESENTING ILLNESS:  Please see previous note for details on initial presentation  INTERVAL HISTORY: Samantha Graham is a wonderful 78 y.o. female who is connected via phone today regarding her Chronic Lymphocytic Leukemia.  I last connected with the patient, via phone, on 04/16/2023 and she was doing well overall.   Patient notes she has been doing well overall since our last visit. She had recent steroid injections to help her knee pain.   She denies any new infection issues, fever, chills, night sweats, unexpected weight loss, back pain, chest pain, abdominal pain, lumps/bumps, SOB, or leg swelling.   She does complain of occasional mild bilateral leg cramps at night. She has started magnesium supplement.   Patient is regularly taking B-Complex supplement and Iron supplement.    MEDICAL HISTORY:  Past Medical History:  Diagnosis Date   Hyperlipidemia   Hypothyroidism  Rt frozen shoulder Osteopenia/osteoporosis  SURGICAL HISTORY: Past Surgical History:  Procedure Laterality Date   THYROIDECTOMY  1985  hysterectomy with BSO due to prolpased uterus Bunionectomy Meniscal injury -rt knee Bladder cyst removal  SOCIAL HISTORY: Social History   Socioeconomic History   Marital status: Married    Spouse name: Not on file   Number of children: Not on file   Years of education: Not on file   Highest education level: Not on file  Occupational History   Not on file  Tobacco Use   Smoking status: Never   Smokeless tobacco: Never  Vaping Use   Vaping status: Never Used  Substance and Sexual Activity   Alcohol use: No   Drug use: No   Sexual activity: Not on file  Other Topics Concern   Not on file  Social  History Narrative   Not on file   Social Drivers of Health   Financial Resource Strain: Not on file  Food Insecurity: Not on file  Transportation Needs: Not on file  Physical Activity: Not on file  Stress: Not on file  Social Connections: Unknown (12/05/2021)   Received from [facility]   Social Network    Social Network: Not on file  Intimate Partner Violence: Unknown (10/26/2021)   Received from [facility], [facility]   HITS    Physically Hurt: Not on file    Insult or Talk Down To: Not on file    Threaten Physical Harm: Not on file    Scream or Curse: Not on file    FAMILY HISTORY: Family History  Problem Relation Age of Onset   Thyroid disease Mother    Cancer Father    Diabetes Cousin    Breast cancer Neg Hx     ALLERGIES:  has no known allergies.  MEDICATIONS:  Current Outpatient Medications  Medication Sig Dispense Refill   B Complex-C-E-Zn (B COMPLEX-C-E-ZINC) tablet Take 1 tablet by mouth daily.     Cholecalciferol (VITAMIN D3) 5000 UNITS CAPS Take 5,000 Units by mouth once.     cycloSPORINE (RESTASIS) 0.05 % ophthalmic emulsion 1 drop 2 (two) times daily.     loteprednol (LOTEMAX) 0.5 % ophthalmic suspension 1 drop every morning.     meloxicam (MOBIC) 15 MG tablet Take 15 mg by mouth daily.     MIEBO 1.338 GM/ML SOLN Place into  both eyes QID.     Nutritional Supplements (HI-CAL) LIQD Take by mouth.     Omega-3 Fatty Acids (FISH OIL) 1000 MG CAPS Take by mouth.     raloxifene (EVISTA) 60 MG tablet Take 1 tablet (60 mg total) by mouth daily. 90 tablet 0   rosuvastatin (CRESTOR) 10 MG tablet Take 1 tablet (10 mg total) by mouth daily. 90 tablet 1   SIMBRINZA 1-0.2 % SUSP Apply 1 drop to eye 2 (two) times daily.     thyroid (NP THYROID) 90 MG tablet Take 1/2 (one-half) tablet by mouth once daily 45 tablet 3   XIIDRA 5 % SOLN Apply 1 drop to eye 2 (two) times daily.     No current facility-administered medications for this visit.     REVIEW OF SYSTEMS:   10 Point review of Systems was done is negative except as noted above.  PHYSICAL EXAMINATION:  .BP (!) 108/58 (BP Location: Left Arm, Patient Position: Sitting) Comment: Nurse notified  Pulse 65   Temp 98.2 F (36.8 C) (Temporal)   Resp 17   Ht 5\' 2"  (1.575 m)   Wt 110 lb 6.4 oz (50.1 kg)   SpO2 100%   BMI 20.19 kg/m   GENERAL:alert, in no acute distress and comfortable SKIN: no acute rashes, no significant lesions EYES: conjunctiva are pink and non-injected, sclera anicteric OROPHARYNX: MMM, no exudates, no oropharyngeal erythema or ulceration NECK: supple, no JVD LYMPH:  no palpable lymphadenopathy in the cervical, axillary or inguinal regions LUNGS: clear to auscultation b/l with normal respiratory effort HEART: regular rate & rhythm ABDOMEN:  normoactive bowel sounds , non tender, not distended. Extremity: no pedal edema PSYCH: alert & oriented x 3 with fluent speech NEURO: no focal motor/sensory deficits  LABORATORY DATA:  I have reviewed the data as listed  .    Latest Ref Rng & Units 10/15/2023    9:53 AM 04/09/2023   12:56 PM 09/25/2022    1:10 PM  CBC  WBC 4.0 - 10.5 K/uL 180.4  172.9  155.4   Hemoglobin 12.0 - 15.0 g/dL 96.0  45.4  09.8   Hematocrit 36.0 - 46.0 % 40.6  40.6  39.2   Platelets 150 - 400 K/uL 146  179  179    . CBC    Component Value Date/Time   WBC 180.4 (HH) 10/15/2023 0953   WBC 57.4 (HH) 04/13/2021 1336   RBC 4.06 10/15/2023 0953   HGB 12.3 10/15/2023 0953   HCT 40.6 10/15/2023 0953   PLT 146 (L) 10/15/2023 0953   MCV 100.0 10/15/2023 0953   MCH 30.3 10/15/2023 0953   MCHC 30.3 10/15/2023 0953   RDW 16.2 (H) 10/15/2023 0953   LYMPHSABS 174.0 (H) 10/15/2023 0953   MONOABS 2.0 (H) 10/15/2023 0953   EOSABS 0.1 10/15/2023 0953   BASOSABS 0.0 10/15/2023 0953     .    Latest Ref Rng & Units 10/15/2023    9:53 AM 07/03/2023    1:36 PM 04/09/2023   12:56 PM  CMP  Glucose 70 - 99 mg/dL 94  90  119   BUN 8  - 23 mg/dL 24  29  27    Creatinine 0.44 - 1.00 mg/dL 1.47  8.29  5.62   Sodium 135 - 145 mmol/L 137  141  142   Potassium 3.5 - 5.1 mmol/L 4.4  4.4  4.4   Chloride 98 - 111 mmol/L 103  105  108   CO2 22 - 32 mmol/L  29  28  29    Calcium 8.9 - 10.3 mg/dL 9.5  9.6  9.4   Total Protein 6.5 - 8.1 g/dL 6.7   6.4   Total Bilirubin 0.0 - 1.2 mg/dL 0.4   0.4   Alkaline Phos 38 - 126 U/L 36   41   AST 15 - 41 U/L 22   21   ALT 0 - 44 U/L 16   16    . Lab Results  Component Value Date   LDH 136 10/15/2023         FISH:     RADIOGRAPHIC STUDIES: I have personally reviewed the radiological images as listed and agreed with the findings in the report. No results found.  ASSESSMENT & PLAN:   78 y.o. 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 No constitutional symptoms. Newly noted - incidentally on annual labs. No older labs available for reference.  FISH Prognostic panel revealed a monoallelic 13q deletion which  might suggest a more indolent course for her CLL  11/19/17 CT C/A/P revealed no changes in spleen size and no overtly pathologic adenopathy is identified. One upper normal sized lymph node is a 9 mm in short axis left axillary lymph node.   [name]  PLAN: -Discussed lab results from today, 10/15/2023, in detail with the patient. CBC shows elevated WBC of 180.4 K and slightly low platelets of 146 K. CMP shows slightly elevated BUN of 24 with creatinine of 1.03, and low Alkaline phosphate level of 36.  -No indication to start treatment.  -Spleen is not enlarged.  -Discussed the option of CT scan since it has been around 2 years since her last CT scan. Pt does not want an CT scan right  now.  -No lab or clinic evidence of significant  symptomatic CLL progression at this time. Will continue watchful observation.  -Recommended that the pt continue to eat well, drink at least 48-64 oz of water each day, and walk 20-30 minutes each day.  -Answered all of patient's questions.   FOLLOW-UP: RTC with Dr Candise Che with labs in 6 months Patient has appointment with [facility] in 3 months  The total time spent in the appointment was 20 minutes* .  All of the patient's questions were answered with apparent satisfaction. The patient knows to call the clinic with any problems, questions or concerns.   Wyvonnia Lora MD MS AAHIVMS [facility] Hematology/Oncology Physician [facility]  .*Total Encounter Time as defined by the Centers for Medicare and Medicaid Services includes, in addition to the face-to-face time of a patient visit (documented in the note above) non-face-to-face time: obtaining and reviewing outside history, ordering and reviewing medications, tests or procedures, care coordination (communications with other health care professionals or caregivers) and documentation in the medical record.   I,Param Shah,acting as a Neurosurgeon for Wyvonnia Lora, MD.,have documented all relevant documentation on the behalf of Wyvonnia Lora, MD,as directed by  Wyvonnia Lora, MD while in the presence of Wyvonnia Lora, MD.  .I have reviewed the above documentation for accuracy and completeness, and I agree with the above. Johney Maine MD

## 2023-10-18 ENCOUNTER — Telehealth: Payer: Self-pay | Admitting: Hematology

## 2023-10-18 DIAGNOSIS — E538 Deficiency of other specified B group vitamins: Secondary | ICD-10-CM | POA: Diagnosis not present

## 2023-10-18 NOTE — Telephone Encounter (Signed)
 Left patient a vm regarding upcoming appointment

## 2023-11-15 DIAGNOSIS — E538 Deficiency of other specified B group vitamins: Secondary | ICD-10-CM | POA: Diagnosis not present

## 2023-12-14 DIAGNOSIS — E538 Deficiency of other specified B group vitamins: Secondary | ICD-10-CM | POA: Diagnosis not present

## 2024-01-09 DIAGNOSIS — D225 Melanocytic nevi of trunk: Secondary | ICD-10-CM | POA: Diagnosis not present

## 2024-01-09 DIAGNOSIS — L814 Other melanin hyperpigmentation: Secondary | ICD-10-CM | POA: Diagnosis not present

## 2024-01-09 DIAGNOSIS — L578 Other skin changes due to chronic exposure to nonionizing radiation: Secondary | ICD-10-CM | POA: Diagnosis not present

## 2024-01-09 DIAGNOSIS — D1801 Hemangioma of skin and subcutaneous tissue: Secondary | ICD-10-CM | POA: Diagnosis not present

## 2024-01-09 DIAGNOSIS — Z08 Encounter for follow-up examination after completed treatment for malignant neoplasm: Secondary | ICD-10-CM | POA: Diagnosis not present

## 2024-01-09 DIAGNOSIS — Z85828 Personal history of other malignant neoplasm of skin: Secondary | ICD-10-CM | POA: Diagnosis not present

## 2024-01-09 DIAGNOSIS — L821 Other seborrheic keratosis: Secondary | ICD-10-CM | POA: Diagnosis not present

## 2024-01-09 DIAGNOSIS — L570 Actinic keratosis: Secondary | ICD-10-CM | POA: Diagnosis not present

## 2024-01-15 DIAGNOSIS — E538 Deficiency of other specified B group vitamins: Secondary | ICD-10-CM | POA: Diagnosis not present

## 2024-01-29 DIAGNOSIS — C9110 Chronic lymphocytic leukemia of B-cell type not having achieved remission: Secondary | ICD-10-CM | POA: Diagnosis not present

## 2024-02-14 ENCOUNTER — Other Ambulatory Visit: Payer: Self-pay | Admitting: Endocrinology

## 2024-02-14 DIAGNOSIS — E7800 Pure hypercholesterolemia, unspecified: Secondary | ICD-10-CM

## 2024-02-14 NOTE — Telephone Encounter (Signed)
 Refill request complete

## 2024-02-18 DIAGNOSIS — E538 Deficiency of other specified B group vitamins: Secondary | ICD-10-CM | POA: Diagnosis not present

## 2024-02-26 ENCOUNTER — Other Ambulatory Visit: Payer: Self-pay | Admitting: Endocrinology

## 2024-02-26 DIAGNOSIS — M810 Age-related osteoporosis without current pathological fracture: Secondary | ICD-10-CM

## 2024-02-26 NOTE — Telephone Encounter (Signed)
 Refill request complete

## 2024-02-28 ENCOUNTER — Other Ambulatory Visit: Payer: Self-pay | Admitting: Family Medicine

## 2024-02-28 DIAGNOSIS — Z1231 Encounter for screening mammogram for malignant neoplasm of breast: Secondary | ICD-10-CM

## 2024-03-10 DIAGNOSIS — E538 Deficiency of other specified B group vitamins: Secondary | ICD-10-CM | POA: Diagnosis not present

## 2024-03-10 DIAGNOSIS — M1990 Unspecified osteoarthritis, unspecified site: Secondary | ICD-10-CM | POA: Diagnosis not present

## 2024-03-10 DIAGNOSIS — I700 Atherosclerosis of aorta: Secondary | ICD-10-CM | POA: Diagnosis not present

## 2024-03-10 DIAGNOSIS — M810 Age-related osteoporosis without current pathological fracture: Secondary | ICD-10-CM | POA: Diagnosis not present

## 2024-03-10 DIAGNOSIS — R0602 Shortness of breath: Secondary | ICD-10-CM | POA: Diagnosis not present

## 2024-03-10 DIAGNOSIS — E785 Hyperlipidemia, unspecified: Secondary | ICD-10-CM | POA: Diagnosis not present

## 2024-03-10 DIAGNOSIS — Z0000 Encounter for general adult medical examination without abnormal findings: Secondary | ICD-10-CM | POA: Diagnosis not present

## 2024-03-10 DIAGNOSIS — C9110 Chronic lymphocytic leukemia of B-cell type not having achieved remission: Secondary | ICD-10-CM | POA: Diagnosis not present

## 2024-03-10 DIAGNOSIS — E039 Hypothyroidism, unspecified: Secondary | ICD-10-CM | POA: Diagnosis not present

## 2024-03-11 ENCOUNTER — Other Ambulatory Visit: Payer: Self-pay | Admitting: Family Medicine

## 2024-03-11 DIAGNOSIS — Z13820 Encounter for screening for osteoporosis: Secondary | ICD-10-CM

## 2024-03-12 ENCOUNTER — Ambulatory Visit

## 2024-04-07 ENCOUNTER — Telehealth: Payer: Self-pay | Admitting: Dietician

## 2024-04-07 ENCOUNTER — Other Ambulatory Visit: Payer: Self-pay

## 2024-04-07 DIAGNOSIS — E890 Postprocedural hypothyroidism: Secondary | ICD-10-CM

## 2024-04-07 MED ORDER — THYROID 90 MG PO TABS: ORAL_TABLET | ORAL | 3 refills | Status: DC

## 2024-04-07 NOTE — Telephone Encounter (Signed)
 Patient called stating that she needs a refill of her medication.  Will forward to the RN for Dr. Mercie.  Leita Constable, RD, LDN, CDCES, DipACLM

## 2024-04-10 DIAGNOSIS — E538 Deficiency of other specified B group vitamins: Secondary | ICD-10-CM | POA: Diagnosis not present

## 2024-04-14 ENCOUNTER — Ambulatory Visit
Admission: RE | Admit: 2024-04-14 | Discharge: 2024-04-14 | Disposition: A | Discharge status: Home / Self Care | Source: Ambulatory Visit | Attending: Family Medicine | Admitting: Family Medicine

## 2024-04-14 DIAGNOSIS — Z1231 Encounter for screening mammogram for malignant neoplasm of breast: Secondary | ICD-10-CM

## 2024-04-18 ENCOUNTER — Ambulatory Visit: Admitting: Hematology

## 2024-04-18 ENCOUNTER — Other Ambulatory Visit

## 2024-04-22 DIAGNOSIS — D485 Neoplasm of uncertain behavior of skin: Secondary | ICD-10-CM | POA: Diagnosis not present

## 2024-04-22 DIAGNOSIS — C44529 Squamous cell carcinoma of skin of other part of trunk: Secondary | ICD-10-CM | POA: Diagnosis not present

## 2024-04-30 DIAGNOSIS — C44529 Squamous cell carcinoma of skin of other part of trunk: Secondary | ICD-10-CM | POA: Diagnosis not present

## 2024-05-09 DIAGNOSIS — E538 Deficiency of other specified B group vitamins: Secondary | ICD-10-CM | POA: Diagnosis not present

## 2024-05-12 ENCOUNTER — Other Ambulatory Visit: Payer: Self-pay | Admitting: *Deleted

## 2024-05-12 DIAGNOSIS — C9110 Chronic lymphocytic leukemia of B-cell type not having achieved remission: Secondary | ICD-10-CM

## 2024-05-13 ENCOUNTER — Inpatient Hospital Stay: Admitting: Hematology

## 2024-05-13 ENCOUNTER — Inpatient Hospital Stay: Attending: Hematology

## 2024-05-13 ENCOUNTER — Telehealth: Payer: Self-pay

## 2024-05-13 VITALS — BP 96/58 | HR 60 | Temp 97.5°F | Resp 20 | Wt 107.8 lb

## 2024-05-13 DIAGNOSIS — C9110 Chronic lymphocytic leukemia of B-cell type not having achieved remission: Secondary | ICD-10-CM

## 2024-05-13 DIAGNOSIS — Z79899 Other long term (current) drug therapy: Secondary | ICD-10-CM | POA: Diagnosis not present

## 2024-05-13 LAB — CBC WITH DIFFERENTIAL (CANCER CENTER ONLY)
Abs Immature Granulocytes: 0.2 K/uL — ABNORMAL HIGH (ref 0.00–0.07)
Basophils Absolute: 0.2 K/uL — ABNORMAL HIGH (ref 0.0–0.1)
Basophils Relative: 0 %
Eosinophils Absolute: 0.1 K/uL (ref 0.0–0.5)
Eosinophils Relative: 0 %
HCT: 38.3 % (ref 36.0–46.0)
Hemoglobin: 11.8 g/dL — ABNORMAL LOW (ref 12.0–15.0)
Immature Granulocytes: 0 %
Lymphocytes Relative: 97 %
Lymphs Abs: 163.6 K/uL — ABNORMAL HIGH (ref 0.7–4.0)
MCH: 30.5 pg (ref 26.0–34.0)
MCHC: 30.8 g/dL (ref 30.0–36.0)
MCV: 99 fL (ref 80.0–100.0)
Monocytes Absolute: 2 K/uL — ABNORMAL HIGH (ref 0.1–1.0)
Monocytes Relative: 1 %
Neutro Abs: 3.9 K/uL (ref 1.7–7.7)
Neutrophils Relative %: 2 %
Platelet Count: 171 K/uL (ref 150–400)
RBC: 3.87 MIL/uL (ref 3.87–5.11)
RDW: 15.1 % (ref 11.5–15.5)
Smear Review: NORMAL
WBC Count: 170 K/uL (ref 4.0–10.5)
nRBC: 0 % (ref 0.0–0.2)

## 2024-05-13 LAB — CMP (CANCER CENTER ONLY)
ALT: 13 U/L (ref 0–44)
AST: 23 U/L (ref 15–41)
Albumin: 4.1 g/dL (ref 3.5–5.0)
Alkaline Phosphatase: 47 U/L (ref 38–126)
Anion gap: 6 (ref 5–15)
BUN: 31 mg/dL — ABNORMAL HIGH (ref 8–23)
CO2: 27 mmol/L (ref 22–32)
Calcium: 9.4 mg/dL (ref 8.9–10.3)
Chloride: 106 mmol/L (ref 98–111)
Creatinine: 1.2 mg/dL — ABNORMAL HIGH (ref 0.44–1.00)
GFR, Estimated: 46 mL/min — ABNORMAL LOW (ref >60)
Glucose, Bld: 107 mg/dL — ABNORMAL HIGH (ref 70–99)
Potassium: 4.5 mmol/L (ref 3.5–5.1)
Sodium: 139 mmol/L (ref 135–145)
Total Bilirubin: 0.3 mg/dL (ref 0.0–1.2)
Total Protein: 6.3 g/dL — ABNORMAL LOW (ref 6.5–8.1)

## 2024-05-13 LAB — LACTATE DEHYDROGENASE: LDH: 139 U/L (ref 98–192)

## 2024-05-13 NOTE — Progress Notes (Addendum)
 HEMATOLOGY/ONCOLOGY CLINIC VISIT NOTE  Date of Service: 05/13/24   Patient Care Team: Nanci Senior, MD as PCP - General (Family Medicine)  CHIEF COMPLAINTS/PURPOSE OF CONSULTATION:  Follow-up for continued evaluation and management of CLL  HISTORY OF PRESENTING ILLNESS:  (08/02/2017) Samantha Graham is a wonderful 78 y.o. female who has been referred to us  by Dr. Jordan, Betty G, MD for evaluation and management of incidentally noted leucocytosis/Lymphocytosis.   Patient is generally in good overall health and on labs done for her annual physical in 06/2017 on labs was incidentally noted to have leucocytosis of 21.7k with lymphocytosis of 17.7k. Nl hgb of 13.8 and nl platelet count of 200k. She notes no new fatigue, fevers/chills/night sweats, overt LN enlargement, abdominal pain or distension. No recent viral infection. It is unclear if she has been noted to have lymphocytosis prior to these labs (old labs not available to us  currently).   She was referred to us  for further evaluation of her lymphocytosis.  She notes she feels no differently than has has felt over the last few years.   Patient notes that she is a non smoker. No other recent chemical exposures. No PRBC transfusions in the past.  INTERVAL HISTORY: Samantha Graham is a wonderful 78 y.o. female who is connected via phone today regarding her Chronic Lymphocytic Leukemia.  Last seen by me on 10/15/2023 and she noted  Patient notes she has been doing well overall since our last visit. She had recent steroid injections to help her knee pain.   She denies any new infection issues, fever, chills, night sweats, unexpected weight loss, back pain, chest pain, abdominal pain, lumps/bumps, SOB, or leg swelling.   She does complain of occasional mild bilateral leg cramps at night. She has started magnesium supplement.   Patient is regularly taking B-Complex supplement and Iron supplement.   Today, she reports that  she recently underwent oral surgery and was placed on Amoxicillin. 4 days after starting, she developed a rash on upper extremities - stopped taking last Wednesday. She notes that this is the first time she's ever taken Amoxicillin. She has been managing this with a cream.  Otherwise, denies any new infection issues, fevers/chills, drenching night sweats, unexpected weight change, abdominal pain/distention, or new lumps/bumps. Reports that she has been started on PO Iron.   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   Says that she did not have a knee injection recently to prevent her WBC from increasing.  MEDICAL HISTORY:  Past Medical History:  Diagnosis Date   Hyperlipidemia   Hypothyroidism  Rt frozen shoulder Osteopenia/osteoporosis  SURGICAL HISTORY: Past Surgical History:  Procedure Laterality Date   THYROIDECTOMY  1985  hysterectomy with BSO due to prolpased uterus Bunionectomy Meniscal injury -rt knee Bladder cyst removal  SOCIAL HISTORY: Social History   Socioeconomic History   Marital status: Married    Spouse name: Not on file   Number of children: Not on file   Years of education: Not on file   Highest education level: Not on file  Occupational History   Not on file  Tobacco Use   Smoking status: Never   Smokeless tobacco: Never  Vaping Use   Vaping status: Never Used  Substance and Sexual Activity   Alcohol use: No   Drug use: No   Sexual activity: Not on file  Other Topics Concern   Not on file  Social  History Narrative   Not on file   Social Drivers of Health   Financial Resource Strain: Not on file  Food Insecurity: Not on file  Transportation Needs: Not on file  Physical Activity: Not on file  Stress: Not on file  Social Connections: Unknown (12/05/2021)   Received from [facility]   Social Network    Social Network: Not on file  Intimate Partner Violence: Unknown (10/26/2021)    Received from [facility]   HITS    Physically Hurt: Not on file    Insult or Talk Down To: Not on file    Threaten Physical Harm: Not on file    Scream or Curse: Not on file    FAMILY HISTORY: Family History  Problem Relation Age of Onset   Thyroid  disease Mother    Cancer Father    Diabetes Cousin    Breast cancer Neg Hx     ALLERGIES:  has no known allergies.  MEDICATIONS:  Current Outpatient Medications  Medication Sig Dispense Refill   B Complex-C-E-Zn (B COMPLEX-C-E-ZINC) tablet Take 1 tablet by mouth daily.     Cholecalciferol (VITAMIN D3) 5000 UNITS CAPS Take 5,000 Units by mouth once.     cycloSPORINE (RESTASIS) 0.05 % ophthalmic emulsion 1 drop 2 (two) times daily.     loteprednol (LOTEMAX) 0.5 % ophthalmic suspension 1 drop every morning.     meloxicam (MOBIC) 15 MG tablet Take 15 mg by mouth daily.     MIEBO 1.338 GM/ML SOLN Place into both eyes QID.     Nutritional Supplements (HI-CAL) LIQD Take by mouth.     Omega-3 Fatty Acids (FISH OIL) 1000 MG CAPS Take by mouth.     raloxifene  (EVISTA ) 60 MG tablet Take 1 tablet by mouth once daily 90 tablet 0   rosuvastatin  (CRESTOR ) 10 MG tablet TAKE 1 TABLET BY MOUTH EVERY DAY 90 tablet 1   SIMBRINZA 1-0.2 % SUSP Apply 1 drop to eye 2 (two) times daily.     thyroid  (NP THYROID ) 90 MG tablet Take 1/2 (one-half) tablet by mouth once daily 45 tablet 3   XIIDRA 5 % SOLN Apply 1 drop to eye 2 (two) times daily.     No current facility-administered medications for this visit.    REVIEW OF SYSTEMS:   10 Point review of Systems was done is negative except as noted above.  PHYSICAL EXAMINATION:  .BP (!) 96/58   Pulse 60   Temp (!) 97.5 F (36.4 C)   Resp 20   Wt 107 lb 12.8 oz (48.9 kg)   SpO2 98%   BMI 19.72 kg/m   GENERAL:alert, in no acute distress and comfortable SKIN: no acute rashes, no significant lesions EYES: conjunctiva are pink and non-injected, sclera anicteric OROPHARYNX: MMM, no exudates, no  oropharyngeal erythema or ulceration NECK: supple, no JVD LYMPH:  no palpable lymphadenopathy in the cervical, axillary or inguinal regions LUNGS: clear to auscultation b/l with normal respiratory effort HEART: regular rate & rhythm ABDOMEN:  normoactive bowel sounds , non tender, not distended. Extremity: no pedal edema PSYCH: alert & oriented x 3 with fluent speech NEURO: no focal motor/sensory deficits  LABORATORY DATA:  I have reviewed the data as listed     Latest Ref Rng & Units 05/13/2024    1:10 PM 10/15/2023    9:53 AM 04/09/2023   12:56 PM  CBC EXTENDED  WBC 4.0 - 10.5 K/uL 170.0  180.4  172.9   RBC 3.87 - 5.11 MIL/uL 3.87  4.06  4.07   Hemoglobin 12.0 - 15.0 g/dL 88.1  87.6  87.6   HCT 36.0 - 46.0 % 38.3  40.6  40.6   Platelets 150 - 400 K/uL 171  146  179   NEUT# 1.7 - 7.7 K/uL 3.9  4.1  4.1   Lymph# 0.7 - 4.0 K/uL 163.6  174.0  167.0    CBC    Component Value Date/Time   WBC 170.0 (HH) 05/13/2024 1310   WBC 57.4 (HH) 04/13/2021 1336   RBC 3.87 05/13/2024 1310   HGB 11.8 (L) 05/13/2024 1310   HCT 38.3 05/13/2024 1310   PLT 171 05/13/2024 1310   MCV 99.0 05/13/2024 1310   MCH 30.5 05/13/2024 1310   MCHC 30.8 05/13/2024 1310   RDW 15.1 05/13/2024 1310   LYMPHSABS 163.6 (H) 05/13/2024 1310   MONOABS 2.0 (H) 05/13/2024 1310   EOSABS 0.1 05/13/2024 1310   BASOSABS 0.2 (H) 05/13/2024 1310   LDH Lab Results  Component Value Date   LDH 139 05/13/2024      Latest Ref Rng & Units 05/13/2024    1:10 PM 10/15/2023    9:53 AM 07/03/2023    1:36 PM  CMP  Glucose 70 - 99 mg/dL 892  94  90   BUN 8 - 23 mg/dL 31  24  29    Creatinine 0.44 - 1.00 mg/dL 8.79  8.96  9.07   Sodium 135 - 145 mmol/L 139  137  141   Potassium 3.5 - 5.1 mmol/L 4.5  4.4  4.4   Chloride 98 - 111 mmol/L 106  103  105   CO2 22 - 32 mmol/L 27  29  28    Calcium  8.9 - 10.3 mg/dL 9.4  9.5  9.6   Total Protein 6.5 - 8.1 g/dL 6.3  6.7    Total Bilirubin 0.0 - 1.2 mg/dL 0.3  0.4    Alkaline Phos  38 - 126 U/L 47  36    AST 15 - 41 U/L 23  22    ALT 0 - 44 U/L 13  16          FISH:     RADIOGRAPHIC STUDIES: I have personally reviewed the radiological images as listed and agreed with the findings in the report. MM 3D SCREENING MAMMOGRAM BILATERAL BREAST Result Date: 04/16/2024 CLINICAL DATA:  Screening. EXAM: DIGITAL SCREENING BILATERAL MAMMOGRAM WITH TOMOSYNTHESIS AND CAD TECHNIQUE: Bilateral screening digital craniocaudal and mediolateral oblique mammograms were obtained. Bilateral screening digital breast tomosynthesis was performed. The images were evaluated with computer-aided detection. COMPARISON:  Previous exam(s). ACR Breast Density Category b: There are scattered areas of fibroglandular density. FINDINGS: There are no findings suspicious for malignancy. IMPRESSION: No mammographic evidence of malignancy. A result letter of this screening mammogram will be mailed directly to the patient. RECOMMENDATION: Screening mammogram in one year. (Code:SM-B-01Y) BI-RADS CATEGORY  1: Negative. Electronically Signed   By: Curtistine Noble   On: 04/16/2024 08:57    ASSESSMENT & PLAN:   78 y.o. 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 No constitutional symptoms. Newly noted - incidentally on annual labs. No older labs available for reference.  FISH Prognostic panel revealed a monoallelic 13q deletion which  might suggest a more indolent course  for her CLL  11/19/17 CT C/A/P revealed no changes in spleen size and no overtly pathologic adenopathy is identified. One upper normal sized lymph node is a 9 mm in short axis left axillary lymph node.   SABRA  PLAN: - Discussed lab results on  05/13/2024 in detail with patient: CBC showed WBC of 170K decreased from 180K, Hemoglobin of 11.8 decreased from 12.3, PLTs of 171K increased from 146K, MCV 99.0, decreased from 100.0, RDW 15.1, decreased from 16.2, and Lymphocytes of 163.6K increased from 174K Pt has abstained from steroid injection to prevent elevated WBCs, thus I informed her that steroid injections may actually cause a decrease in WBC.  CMP with Creatinine 1.20 increased from 1.03 and BUN 31, elevated from 24.  Likely due to dehydration, which patient agrees with.  - LDH wnl at 139 - No splenomegaly on exam. - No lab or clinical evidence of significant  symptomatic CLL progression at this time. Will continue watchful observation.   - Informed her that CLL can increase risk of skin cancer, specifically 13q deletion Encouraged regular follow-ups with Dermatology.   - Discussed expected duration of rash while taking Amoxicillin.  FOLLOW-UP: - RTC with Dr Onesimo with labs in 6 months - Appointment [facility] in January 2026   The total time spent in the appointment was 30 minutes* .  All of the patient's questions were answered with apparent satisfaction. The patient knows to call the clinic with any problems, questions or concerns.   Emaline Onesimo MD MS AAHIVMS [facility] Hematology/Oncology Physician [facility]  .*Total Encounter Time as defined by the Centers for Medicare and Medicaid Services includes, in addition to the face-to-face time of a patient visit (documented in the note above) non-face-to-face time: obtaining and reviewing outside history, ordering and reviewing medications, tests or procedures, care coordination (communications with other health care professionals or caregivers) and documentation in the medical record.   I,  Damien Lagle,acting as a scribe for Emaline Onesimo, MD.,have documented all relevant documentation on the behalf of Emaline Onesimo, MD,as directed by  Emaline Onesimo, MD while in  the presence of Emaline Onesimo, MD.  I have reviewed the above documentation for accuracy and completeness, and I agree with the above. Emaline Candida Onesimo MD.

## 2024-05-13 NOTE — Telephone Encounter (Signed)
 CRITICAL VALUE STICKER  CRITICAL VALUE: WBC 170  RECEIVER (on-site recipient of call):Samantha Schaafsma, LPN  DATE & TIME NOTIFIED:05/13/24 13:38   MESSENGER (representative from lab):  MD NOTIFIED: Samantha Saran, MD  TIME OF NOTIFICATION:13:38  RESPONSE:

## 2024-05-25 ENCOUNTER — Other Ambulatory Visit: Payer: Self-pay | Admitting: Endocrinology

## 2024-05-25 DIAGNOSIS — M810 Age-related osteoporosis without current pathological fracture: Secondary | ICD-10-CM

## 2024-05-27 DIAGNOSIS — M25562 Pain in left knee: Secondary | ICD-10-CM | POA: Diagnosis not present

## 2024-05-27 DIAGNOSIS — M25561 Pain in right knee: Secondary | ICD-10-CM | POA: Diagnosis not present

## 2024-06-02 DIAGNOSIS — L910 Hypertrophic scar: Secondary | ICD-10-CM | POA: Diagnosis not present

## 2024-06-06 DIAGNOSIS — E538 Deficiency of other specified B group vitamins: Secondary | ICD-10-CM | POA: Diagnosis not present

## 2024-06-11 DIAGNOSIS — C44529 Squamous cell carcinoma of skin of other part of trunk: Secondary | ICD-10-CM | POA: Diagnosis not present

## 2024-06-11 DIAGNOSIS — L910 Hypertrophic scar: Secondary | ICD-10-CM | POA: Diagnosis not present

## 2024-06-11 DIAGNOSIS — D485 Neoplasm of uncertain behavior of skin: Secondary | ICD-10-CM | POA: Diagnosis not present

## 2024-06-25 ENCOUNTER — Ambulatory Visit

## 2024-06-25 DIAGNOSIS — Z13820 Encounter for screening for osteoporosis: Secondary | ICD-10-CM

## 2024-06-25 DIAGNOSIS — Z780 Asymptomatic menopausal state: Secondary | ICD-10-CM | POA: Diagnosis not present

## 2024-06-25 DIAGNOSIS — M8589 Other specified disorders of bone density and structure, multiple sites: Secondary | ICD-10-CM | POA: Diagnosis not present

## 2024-07-08 ENCOUNTER — Other Ambulatory Visit: Payer: PPO

## 2024-07-09 ENCOUNTER — Ambulatory Visit: Payer: Self-pay | Admitting: Endocrinology

## 2024-07-09 LAB — RENAL FUNCTION PANEL
Albumin: 4.3 g/dL (ref 3.6–5.1)
BUN/Creatinine Ratio: 26 (calc) — ABNORMAL HIGH (ref 6–22)
BUN: 29 mg/dL — ABNORMAL HIGH (ref 7–25)
CO2: 31 mmol/L (ref 20–32)
Calcium: 10 mg/dL (ref 8.6–10.4)
Chloride: 104 mmol/L (ref 98–110)
Creat: 1.11 mg/dL — ABNORMAL HIGH (ref 0.60–1.00)
Glucose, Bld: 93 mg/dL (ref 65–99)
Phosphorus: 5.1 mg/dL — ABNORMAL HIGH (ref 2.1–4.3)
Potassium: 3.9 mmol/L (ref 3.5–5.3)
Sodium: 142 mmol/L (ref 135–146)

## 2024-07-09 LAB — T4, FREE: Free T4: 1 ng/dL (ref 0.8–1.8)

## 2024-07-09 LAB — T3, FREE: T3, Free: 2.3 pg/mL (ref 2.3–4.2)

## 2024-07-09 LAB — VITAMIN D 25 HYDROXY (VIT D DEFICIENCY, FRACTURES): Vit D, 25-Hydroxy: 70 ng/mL (ref 30–100)

## 2024-07-09 LAB — TSH: TSH: 2.01 m[IU]/L (ref 0.40–4.50)

## 2024-07-10 ENCOUNTER — Encounter: Payer: Self-pay | Admitting: Endocrinology

## 2024-07-10 ENCOUNTER — Ambulatory Visit: Admitting: Endocrinology

## 2024-07-10 VITALS — BP 100/50 | HR 76 | Ht 62.0 in | Wt 109.0 lb

## 2024-07-10 DIAGNOSIS — E7800 Pure hypercholesterolemia, unspecified: Secondary | ICD-10-CM | POA: Diagnosis not present

## 2024-07-10 DIAGNOSIS — E559 Vitamin D deficiency, unspecified: Secondary | ICD-10-CM | POA: Diagnosis not present

## 2024-07-10 DIAGNOSIS — E890 Postprocedural hypothyroidism: Secondary | ICD-10-CM | POA: Diagnosis not present

## 2024-07-10 DIAGNOSIS — M810 Age-related osteoporosis without current pathological fracture: Secondary | ICD-10-CM

## 2024-07-10 MED ORDER — THYROID 90 MG PO TABS: ORAL_TABLET | ORAL | 3 refills | Status: AC

## 2024-07-10 NOTE — Progress Notes (Unsigned)
 Outpatient Endocrinology Note Samantha Lefebre, MD  07/10/2024  Patient's Name: Samantha Graham    DOB: 1946/02/02    MRN: [id number]  REASON OF VISIT: Follow-up for hypothyroidism /osteoporosis  PCP: Nanci Senior, MD  HISTORY OF PRESENT ILLNESS:   Samantha Graham is a 78 y.o. old female with past medical history as listed below is presented for a follow up for hypothyroidism /osteoporosis.   Pertinent History: Patient was previously seen by Dr. Von and was last time seen in December 2023.  # Postsurgical hypothyroidism -Patient had a history of hypothyroidism and thyroid  nodule treated with thyroidectomy and ?  Radioactive iodine, no details record available to review.  In 2001 she was taking levothyroxine  / brand name Synthroid  88 mcg daily.  Due to extreme fatigue on levothyroxine  she was started empirically on Armour Thyroid  in October 2015 with 45 mg daily.  She had improvement on fatigue and less hair loss after being on Armour Thyroid . Because of her complaining of significant fatigue she was given a trial of levothyroxine  37.5 mcg with 5 mcg of Cytomel  instead of 45 Armour Thyroid  but she preferred to go back to thyroid  for better energy level.  # Osteoporosis:   Baseline bone density done by gynecologist showed T score was -3.0.  Bone density on 08/08/16 shows T scores of -2.4 at spine (improved) and -2.2 at hip, stable. Report from 04/29/2019: Bone density at spine is --1.9, Bone density at the hip -2.4 on the left side and -2.0 on the right   Most recent bone density result:   11/29/2021 Lumbar spine L1-L3 (L4) Femoral neck (FN) 33% distal radius  T-score -2.1 RFN: -1.9 LFN: -2.3 n/a  Change in BMD from previous DXA test (%) +3.0%   +2.0% n/a    She did not want to take Fosamax  because it did not work with her mother apparently. She was given ACTONEL  in 2017 and again 2018 but stopped this because of her perception that she was having joint pains after taking this after  a few prescriptions. Also concerned about possible hair loss as a side effect. Reclast was given in 5/16, she did have some nausea the next day Does not want to take Reclast again because she thinks it made her hair thin out.   She was started on Evista  in 12/20 and she continues to take this regularly. No complaints of hot flashes with this.    Vitamin D  supplements: She is taking 5000 units daily. Vitamin D  level has been normal.   # Hypercholesterolemia:  Her baseline LDL was 210 and she is being treated with Crestor  10 mg. Has been following a low-cholesterol low-fat diet consistently.  Note : CLL: She is being followed by oncologist for stable disease and no treatment recommended.   Interval history Patient has been taking NP thyroid , denies palpitation or heat intolerance.  No following fracture.  She has been taking vitamin D  3 5000 international unit daily.  She has not been taking calcium  supplement at this time.  He does not eat much dairy products.  No fall and fracture.  She has been taking Evista .  No other complaints today.  Recent lab results reviewed.  Electrolytes normal, mildly elevated BUN likely dehydration.  Lipids level acceptable with LDL 95.  Triglyceride is elevated likely nonfasting sample.  Vitamin D  is normal at 57.  Thyroid  function test normal.   Latest Reference Range & Units 07/08/24 13:03  TSH 0.40 - 4.50 mIU/L 2.01  Triiodothyronine,Free,Serum  2.3 - 4.2 pg/mL 2.3  T4,Free(Direct) 0.8 - 1.8 ng/dL 1.0    REVIEW OF SYSTEMS:  As per history of present illness.   PAST MEDICAL HISTORY: Past Medical History:  Diagnosis Date   Hyperlipidemia     PAST SURGICAL HISTORY: Past Surgical History:  Procedure Laterality Date   THYROIDECTOMY  1985    ALLERGIES: Allergies  Allergen Reactions   Amoxicillin     FAMILY HISTORY:  Family History  Problem Relation Age of Onset   Thyroid  disease Mother    Cancer Father    Diabetes Cousin    Breast cancer Neg  Hx     SOCIAL HISTORY: Social History   Socioeconomic History   Marital status: Married    Spouse name: Not on file   Number of children: Not on file   Years of education: Not on file   Highest education level: Not on file  Occupational History   Not on file  Tobacco Use   Smoking status: Never   Smokeless tobacco: Never  Vaping Use   Vaping status: Never Used  Substance and Sexual Activity   Alcohol use: No   Drug use: No   Sexual activity: Not on file  Other Topics Concern   Not on file  Social History Narrative   Not on file   Social Drivers of Health   Tobacco Use: Low Risk (07/10/2024)   Patient History    Smoking Tobacco Use: Never    Smokeless Tobacco Use: Never    Passive Exposure: Not on file  Financial Resource Strain: Not on file  Food Insecurity: Not on file  Transportation Needs: Not on file  Physical Activity: Not on file  Stress: Not on file  Social Connections: Not on file  Depression (EYV7-0): Not on file  Alcohol Screen: Not on file  Housing: Unknown (07/31/2023)   Received from [facility]   Epic    Unable to Pay for Housing in the Last Year: Not on file    Number of Times Moved in the Last Year: Not on file    At any time in the past 12 months, were you homeless or living in a shelter (including now)?: No  Utilities: Not on file  Health Literacy: Not on file    MEDICATIONS:  Current Outpatient Medications  Medication Sig Dispense Refill   B Complex-C-E-Zn (B COMPLEX-C-E-ZINC) tablet Take 1 tablet by mouth daily.     Cholecalciferol (VITAMIN D3) 5000 UNITS CAPS Take 5,000 Units by mouth once.     cycloSPORINE (RESTASIS) 0.05 % ophthalmic emulsion 1 drop 2 (two) times daily.     loteprednol (LOTEMAX) 0.5 % ophthalmic suspension 1 drop every morning.     meloxicam (MOBIC) 15 MG tablet Take 15 mg by mouth daily.     MIEBO 1.338 GM/ML SOLN Place into both eyes QID.     Nutritional Supplements (HI-CAL) LIQD Take by mouth.      Omega-3 Fatty Acids (FISH OIL) 1000 MG CAPS Take by mouth.     raloxifene  (EVISTA ) 60 MG tablet Take 1 tablet by mouth once daily 90 tablet 3   rosuvastatin  (CRESTOR ) 10 MG tablet TAKE 1 TABLET BY MOUTH EVERY DAY 90 tablet 1   timolol (TIMOPTIC) 0.5 % ophthalmic solution Place 1 drop into both eyes daily.     XIIDRA 5 % SOLN Apply 1 drop to eye 2 (two) times daily.     thyroid  (NP THYROID ) 90 MG tablet Take 1/2 (one-half) tablet by  mouth once daily for 6 days and 1 tab on 7th day. 47 tablet 3   No current facility-administered medications for this visit.    PHYSICAL EXAM: Vitals:   07/10/24 1253  BP: (!) 100/50  Pulse: 76  SpO2: 96%  Weight: 109 lb (49.4 kg)  Height: 5' 2 (1.575 m)   Body mass index is 19.94 kg/m.  Wt Readings from Last 3 Encounters:  07/10/24 109 lb (49.4 kg)  05/13/24 107 lb 12.8 oz (48.9 kg)  10/15/23 110 lb 6.4 oz (50.1 kg)    General: Well developed, well nourished female in no apparent distress.  HEENT: AT/[place], no external lesions. Hearing intact to the spoken word Eyes: Conjunctiva clear and no icterus. Neck: Trachea midline, neck supple, anterior neck scar + Abdomen: Soft, non tender, non distended, no masses, no striae Neurologic: Alert, oriented, normal speech, deep tendon biceps reflexes normal Extremities: No pedal pitting edema, no tremors of outstretched hands Skin: Warm, color good.  Psychiatric: Does not appear depressed or anxious  PERTINENT HISTORIC LABORATORY AND IMAGING STUDIES:  All pertinent laboratory results were reviewed. Please see HPI also for further details.   TSH  Date Value Ref Range Status  07/08/2024 2.01 0.40 - 4.50 mIU/L Final  07/03/2023 2.81 0.40 - 4.50 mIU/L Final  07/11/2022 1.35 0.35 - 5.50 uIU/mL Final     ASSESSMENT / PLAN  1. Postsurgical hypothyroidism     # Postsurgical hypothyroidism : -Discussed that Armour Thyroid /NP thyroid  has increased risks of bone loss compared to levothyroxine . -She had  recent normal thyroid  function test.  Will continue current dose of NP thyroid  45 mg daily.  # Osteoporosis  -Continue Evista  -Check DEXA scan prior to follow-up visit in 1 year. -Continue current dose of vitamin D3 supplement 5000 international unit daily. -Advised to take calcium  500 mg 2 times a day over-the-counter. -Discussed about fall precaution and weightbearing exercise.  # Hyperlipidemia -Continue Crestor  10 mg daily.   Alyza was seen today for medical management of chronic issues.  Diagnoses and all orders for this visit:  Postsurgical hypothyroidism -     thyroid  (NP THYROID ) 90 MG tablet; Take 1/2 (one-half) tablet by mouth once daily for 6 days and 1 tab on 7th day. -     T4, free -     T3, free -     TSH     DISPOSITION Follow up in clinic in 12 months suggested.  All questions answered and patient verbalized understanding of the plan.  Samantha Cragin, MD [facility] Endocrinology [facility] [address] [place], [address] [place], [place] [address] Phone # [phone]  At least part of this note was generated using voice recognition software. Inadvertent word errors may have occurred, which were not recognized during the proofreading process.

## 2024-07-11 ENCOUNTER — Ambulatory Visit: Payer: PPO | Admitting: Endocrinology

## 2024-07-11 ENCOUNTER — Encounter: Payer: Self-pay | Admitting: Endocrinology

## 2024-08-15 ENCOUNTER — Other Ambulatory Visit: Payer: Self-pay | Admitting: Endocrinology

## 2024-08-15 DIAGNOSIS — E7800 Pure hypercholesterolemia, unspecified: Secondary | ICD-10-CM

## 2024-10-09 ENCOUNTER — Other Ambulatory Visit

## 2024-11-11 ENCOUNTER — Inpatient Hospital Stay: Admitting: Hematology

## 2024-11-11 ENCOUNTER — Inpatient Hospital Stay

## 2025-07-06 ENCOUNTER — Other Ambulatory Visit

## 2025-07-09 ENCOUNTER — Ambulatory Visit: Admitting: Endocrinology
